# Patient Record
Sex: Female | Born: 1935 | Race: White | Hispanic: No | Smoking: Former smoker
Health system: Southern US, Community
[De-identification: ages and names within clinical notes are randomized; demographics above are authoritative.]

## PROBLEM LIST (undated history)

## (undated) DIAGNOSIS — Z9889 Other specified postprocedural states: Secondary | ICD-10-CM

## (undated) DIAGNOSIS — Z8619 Personal history of other infectious and parasitic diseases: Secondary | ICD-10-CM

## (undated) DIAGNOSIS — I739 Peripheral vascular disease, unspecified: Secondary | ICD-10-CM

## (undated) DIAGNOSIS — Z8673 Personal history of transient ischemic attack (TIA), and cerebral infarction without residual deficits: Secondary | ICD-10-CM

## (undated) DIAGNOSIS — J440 Chronic obstructive pulmonary disease with acute lower respiratory infection: Secondary | ICD-10-CM

## (undated) DIAGNOSIS — I10 Essential (primary) hypertension: Secondary | ICD-10-CM

## (undated) DIAGNOSIS — R7303 Prediabetes: Secondary | ICD-10-CM

## (undated) DIAGNOSIS — Z8679 Personal history of other diseases of the circulatory system: Secondary | ICD-10-CM

## (undated) DIAGNOSIS — E785 Hyperlipidemia, unspecified: Secondary | ICD-10-CM

## (undated) DIAGNOSIS — I2510 Atherosclerotic heart disease of native coronary artery without angina pectoris: Secondary | ICD-10-CM

## (undated) DIAGNOSIS — F17200 Nicotine dependence, unspecified, uncomplicated: Secondary | ICD-10-CM

## (undated) DIAGNOSIS — J209 Acute bronchitis, unspecified: Secondary | ICD-10-CM

## (undated) DIAGNOSIS — I5042 Chronic combined systolic (congestive) and diastolic (congestive) heart failure: Secondary | ICD-10-CM

## (undated) HISTORY — DX: Personal history of other infectious and parasitic diseases: Z86.19

## (undated) HISTORY — DX: Peripheral vascular disease, unspecified: I73.9

## (undated) HISTORY — DX: Hyperlipidemia, unspecified: E78.5

## (undated) HISTORY — DX: Other specified postprocedural states: Z98.890

## (undated) HISTORY — DX: Essential (primary) hypertension: I10

## (undated) HISTORY — DX: Atherosclerotic heart disease of native coronary artery without angina pectoris: I25.10

## (undated) HISTORY — DX: Chronic obstructive pulmonary disease with (acute) lower respiratory infection: J44.0

## (undated) HISTORY — DX: Acute bronchitis, unspecified: J20.9

## (undated) HISTORY — PX: AORTA - SUPERIOR MESENTERIC ARTERY BYPASS GRAFT: SHX888

## (undated) HISTORY — DX: Nicotine dependence, unspecified, uncomplicated: F17.200

## (undated) HISTORY — DX: Chronic combined systolic (congestive) and diastolic (congestive) heart failure: I50.42

## (undated) HISTORY — DX: Personal history of transient ischemic attack (TIA), and cerebral infarction without residual deficits: Z86.73

## (undated) HISTORY — PX: VEIN LIGATION AND STRIPPING: SHX2653

## (undated) HISTORY — DX: Prediabetes: R73.03

## (undated) HISTORY — DX: Personal history of other diseases of the circulatory system: Z86.79

---
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
## 1971-03-07 HISTORY — PX: TOTAL ABDOMINAL HYSTERECTOMY: SHX209

## 1996-03-06 DIAGNOSIS — Z8673 Personal history of transient ischemic attack (TIA), and cerebral infarction without residual deficits: Secondary | ICD-10-CM

## 1996-03-06 HISTORY — DX: Personal history of transient ischemic attack (TIA), and cerebral infarction without residual deficits: Z86.73

## 2004-03-06 DIAGNOSIS — Z8679 Personal history of other diseases of the circulatory system: Secondary | ICD-10-CM

## 2004-03-06 HISTORY — DX: Personal history of other diseases of the circulatory system: Z86.79

## 2004-07-04 HISTORY — PX: US ECHOCARDIOGRAPHY: HXRAD669

## 2004-11-28 ENCOUNTER — Emergency Department (HOSPITAL_COMMUNITY): Admission: EM | Admit: 2004-11-28 | Discharge: 2004-11-28 | Payer: Self-pay | Admitting: Emergency Medicine

## 2005-01-11 HISTORY — PX: THORACIC AORTIC ANEURYSM REPAIR: SHX799

## 2005-07-04 HISTORY — PX: CT SCAN: SHX5351

## 2010-01-08 LAB — TSH: Free T4: 1.24

## 2010-01-08 LAB — CBC AND DIFFERENTIAL
WBC: 6.9
platelet count: 141

## 2010-01-08 LAB — LIPID PANEL
Direct LDL: 75
Triglycerides: 65

## 2010-01-08 LAB — HEMOGLOBIN A1C: A1c: 6.3

## 2010-01-08 LAB — COMPLETE METABOLIC PANEL WITH GFR
ALT: 14 U/L (ref 7–35)
BUN: 23 mg/dL — AB (ref 4–21)
CO2: 26 mmol/L
Creat: 0.95
Total Protein ELP: 6.7

## 2010-08-23 ENCOUNTER — Ambulatory Visit (INDEPENDENT_AMBULATORY_CARE_PROVIDER_SITE_OTHER): Payer: Medicare Other | Admitting: Family Medicine

## 2010-08-23 ENCOUNTER — Encounter: Payer: Self-pay | Admitting: Family Medicine

## 2010-08-23 VITALS — BP 126/72 | HR 76 | Temp 98.3°F | Ht 60.5 in | Wt 129.1 lb

## 2010-08-23 DIAGNOSIS — I509 Heart failure, unspecified: Secondary | ICD-10-CM | POA: Insufficient documentation

## 2010-08-23 DIAGNOSIS — E785 Hyperlipidemia, unspecified: Secondary | ICD-10-CM

## 2010-08-23 DIAGNOSIS — Z87891 Personal history of nicotine dependence: Secondary | ICD-10-CM | POA: Insufficient documentation

## 2010-08-23 DIAGNOSIS — Z13820 Encounter for screening for osteoporosis: Secondary | ICD-10-CM | POA: Insufficient documentation

## 2010-08-23 DIAGNOSIS — Z8679 Personal history of other diseases of the circulatory system: Secondary | ICD-10-CM

## 2010-08-23 DIAGNOSIS — I10 Essential (primary) hypertension: Secondary | ICD-10-CM | POA: Insufficient documentation

## 2010-08-23 DIAGNOSIS — F17200 Nicotine dependence, unspecified, uncomplicated: Secondary | ICD-10-CM

## 2010-08-23 DIAGNOSIS — Z9889 Other specified postprocedural states: Secondary | ICD-10-CM

## 2010-08-23 MED ORDER — HYDROCHLOROTHIAZIDE 12.5 MG PO CAPS: 12.5000 mg | ORAL_CAPSULE | Freq: Every day | ORAL | Status: DC

## 2010-08-23 MED ORDER — ATORVASTATIN CALCIUM 80 MG PO TABS: 80.0000 mg | ORAL_TABLET | Freq: Every day | ORAL | Status: DC

## 2010-08-23 MED ORDER — METOPROLOL TARTRATE 50 MG PO TABS: 50.0000 mg | ORAL_TABLET | Freq: Two times a day (BID) | ORAL | Status: DC

## 2010-08-23 MED ORDER — ENALAPRIL MALEATE 20 MG PO TABS: 20.0000 mg | ORAL_TABLET | Freq: Two times a day (BID) | ORAL | Status: DC

## 2010-08-23 NOTE — Assessment & Plan Note (Signed)
Discussed dexa scan. Pt prefers to wait for records to see what has been done. If not ordered previously, will recommend set up for this. Some kyphosis on exam, nontender. Recommend yearly vision screen.

## 2010-08-23 NOTE — Assessment & Plan Note (Signed)
no CHF sxs currently.  On ACEI, b blocker.

## 2010-08-23 NOTE — Patient Instructions (Addendum)
We will wait for records from Dr. Lequita Halt. Refills sent to your pharmacy. You are likely due for bone density scan, we will see what has been done in past, may order once I receive records from Dr. Lequita Halt. Ensure getting good calcium and vitamin D in your diet for bone health. Return in 3 months for physical or follow up, fasting a few days prior for blood work. Good to meet you today, call us with questions.

## 2010-08-23 NOTE — Assessment & Plan Note (Signed)
Encouraged cessation. Contemplative. Has tried patches in past, poor result.  Not currently interested in pharmacotherapy.

## 2010-08-23 NOTE — Progress Notes (Signed)
Subjective:    Patient ID: Maria Simon, female    DOB: [birth year], 77 y.o.   MRN: [id number]  HPI CC: new patient, establish  Moved up here 1 year ago, previously seeing Dr. Driscilla Moats in [place], [place].  Told to find new doctor.  Previously seeing him every year, q6 mo for bloodwork.  She requested they send [place] records 1 mo ago, we still have not received.  H/o thoracic aortic aneurysm repair with Tag Gore 2006 by Dr. Pattricia Boss at [facility].  Cold sxs 1 mo ago, took mucinex and tylenol.  Now better.  Smoking 1/2 ppd, started smoking at age 54yo, previously smoked 1 ppd.  Has tried patch with no success.  Would rather quit cold [place].  Preventative: Last tetanus shot unsure. Had pneumovax 2006. Hasn't had shingles shot. Hasn't had dexa scan in past, does get at least 3 cups milk daily with meals.  There is no problem list on file for this patient.  Past Medical History  Diagnosis Date  . CHF (congestive heart failure)     last ultrasound prior to 2011  . HLD (hyperlipidemia)   . HTN (hypertension)   . Status post thoracic aortic aneurysm repair 2006    graft in place  . History of chicken pox   . Smoker    Past Surgical History  Procedure Date  . Thoracic aortic aneurysm repair 01-11-05    first open then endovascular graft; GORE TAG ref: [id number]; Lot: [id number]  . Appendectomy 1950  . Total abdominal hysterectomy 1973    ovaries out.  reason: bleeding   History  Substance Use Topics  . Smoking status: Current Everyday Smoker -- 0.5 packs/day for 61 years    Types: Cigarettes  . Smokeless tobacco: Never Used   Comment: 1/2 PPD usually, but sometimes more  . Alcohol Use: No   Family History  Problem Relation Age of Onset  . Cancer Neg Hx   . Diabetes Neg Hx   . Stroke Neg Hx   . Hypertension Neg Hx    No Known Allergies No current outpatient prescriptions on file prior to visit.   Review of Systems  Constitutional: Negative for fever, chills, activity change,  appetite change, fatigue and unexpected weight change.  HENT: Negative for hearing loss and neck pain.   Eyes: Negative for visual disturbance.  Respiratory: Negative for cough, chest tightness, shortness of breath and wheezing.   Cardiovascular: Negative for chest pain, palpitations and leg swelling.  Gastrointestinal: Negative for nausea, vomiting, abdominal pain, diarrhea, constipation, blood in stool and abdominal distention.  Genitourinary: Negative for hematuria and difficulty urinating.  Musculoskeletal: Negative for myalgias and arthralgias.  Skin: Negative for rash.  Neurological: Negative for dizziness, seizures, syncope and headaches.  Hematological: Negative for adenopathy. Does not bruise/bleed easily.  Psychiatric/Behavioral: Negative for dysphoric mood. The patient is not nervous/anxious.        Objective:   Physical Exam  Nursing note and vitals reviewed. Constitutional: She is oriented to person, place, and time. She appears well-developed and well-nourished. No distress.  HENT:  Head: Normocephalic and atraumatic.  Right Ear: External ear normal.  Left Ear: External ear normal.  Nose: Nose normal.  Mouth/Throat: Oropharynx is clear and moist.  Eyes: Conjunctivae and EOM are normal. Pupils are equal, round, and reactive to light. No scleral icterus.       Beginnings of cataracts bilaterally  Neck: Normal range of motion. Neck supple. No thyromegaly present.  Cardiovascular: Normal rate, regular rhythm,  normal heart sounds and intact distal pulses.   No murmur heard. Pulses:      Radial pulses are 2+ on the right side, and 2+ on the left side.  Pulmonary/Chest: Effort normal and breath sounds normal. No respiratory distress. She has no wheezes. She has no rales.  Abdominal: Soft. Bowel sounds are normal. She exhibits no distension and no mass. There is no tenderness. There is no rebound and no guarding.  Musculoskeletal: Normal range of motion.       Mild senile  kyphosis  Lymphadenopathy:    She has no cervical adenopathy.  Neurological: She is alert and oriented to person, place, and time.       CN grossly intact, station and gait intact  Skin: Skin is warm and dry. No rash noted.  Psychiatric: She has a normal mood and affect. Her behavior is normal. Judgment and thought content normal.          Assessment & Plan:

## 2010-08-23 NOTE — Assessment & Plan Note (Signed)
Good control on current regimen. Continue meds.

## 2010-08-23 NOTE — Assessment & Plan Note (Signed)
longterm atorvastatin 80mg  nightly. Await records from pcp.

## 2010-08-23 NOTE — Assessment & Plan Note (Signed)
On baby aspirin. Advised if any chest pain, new thoracic back pain to return or seek care.

## 2010-08-29 ENCOUNTER — Telehealth: Payer: Self-pay | Admitting: *Deleted

## 2010-08-29 NOTE — Telephone Encounter (Signed)
Attempted to call pt back, number listed in chart is incorrect.

## 2010-08-29 NOTE — Telephone Encounter (Signed)
Please notify I did receive records from previous PCP.

## 2010-08-29 NOTE — Telephone Encounter (Signed)
Pt is asking if you have received her records from Dr. Lequita Halt in [place] yet.

## 2010-09-28 ENCOUNTER — Encounter: Payer: Self-pay | Admitting: Family Medicine

## 2010-09-29 ENCOUNTER — Encounter: Payer: Self-pay | Admitting: Family Medicine

## 2010-11-05 HISTORY — PX: OTHER SURGICAL HISTORY: SHX169

## 2010-11-16 ENCOUNTER — Other Ambulatory Visit: Payer: Self-pay | Admitting: Family Medicine

## 2010-11-16 DIAGNOSIS — I10 Essential (primary) hypertension: Secondary | ICD-10-CM

## 2010-11-16 DIAGNOSIS — E785 Hyperlipidemia, unspecified: Secondary | ICD-10-CM

## 2010-11-18 ENCOUNTER — Other Ambulatory Visit (INDEPENDENT_AMBULATORY_CARE_PROVIDER_SITE_OTHER): Payer: Medicare Other

## 2010-11-18 DIAGNOSIS — E785 Hyperlipidemia, unspecified: Secondary | ICD-10-CM

## 2010-11-18 DIAGNOSIS — I10 Essential (primary) hypertension: Secondary | ICD-10-CM

## 2010-11-18 LAB — COMPREHENSIVE METABOLIC PANEL
Albumin: 3.9 g/dL (ref 3.5–5.2)
Alkaline Phosphatase: 99 U/L (ref 39–117)
CO2: 28 mEq/L (ref 19–32)
GFR: 60.93 mL/min (ref >60.00)
Glucose, Bld: 113 mg/dL — ABNORMAL HIGH (ref 70–99)
Sodium: 140 mEq/L (ref 135–145)
Total Protein: 7 g/dL (ref 6.0–8.3)

## 2010-11-18 LAB — LIPID PANEL
Cholesterol: 109 mg/dL (ref 0–200)
LDL Cholesterol: 59 mg/dL (ref 0–99)
Total CHOL/HDL Ratio: 3
Triglycerides: 60 mg/dL (ref 0.0–149.0)
VLDL: 12 mg/dL (ref 0.0–40.0)

## 2010-11-25 ENCOUNTER — Ambulatory Visit (INDEPENDENT_AMBULATORY_CARE_PROVIDER_SITE_OTHER): Payer: Medicare Other | Admitting: Family Medicine

## 2010-11-25 ENCOUNTER — Encounter: Payer: Self-pay | Admitting: Family Medicine

## 2010-11-25 VITALS — BP 128/70 | HR 80 | Temp 98.0°F | Wt 128.8 lb

## 2010-11-25 DIAGNOSIS — M8580 Other specified disorders of bone density and structure, unspecified site: Secondary | ICD-10-CM

## 2010-11-25 DIAGNOSIS — Z23 Encounter for immunization: Secondary | ICD-10-CM

## 2010-11-25 DIAGNOSIS — F17200 Nicotine dependence, unspecified, uncomplicated: Secondary | ICD-10-CM

## 2010-11-25 DIAGNOSIS — M810 Age-related osteoporosis without current pathological fracture: Secondary | ICD-10-CM | POA: Insufficient documentation

## 2010-11-25 DIAGNOSIS — I2510 Atherosclerotic heart disease of native coronary artery without angina pectoris: Secondary | ICD-10-CM | POA: Insufficient documentation

## 2010-11-25 DIAGNOSIS — I739 Peripheral vascular disease, unspecified: Secondary | ICD-10-CM

## 2010-11-25 DIAGNOSIS — I10 Essential (primary) hypertension: Secondary | ICD-10-CM

## 2010-11-25 DIAGNOSIS — E785 Hyperlipidemia, unspecified: Secondary | ICD-10-CM

## 2010-11-25 DIAGNOSIS — Z8673 Personal history of transient ischemic attack (TIA), and cerebral infarction without residual deficits: Secondary | ICD-10-CM

## 2010-11-25 MED ORDER — MULTIVITAMINS PO TABS: 1.0000 | ORAL_TABLET | Freq: Every day | ORAL | Status: AC

## 2010-11-25 MED ORDER — VITAMIN D 1000 UNITS PO CAPS: 1000.0000 [IU] | ORAL_CAPSULE | Freq: Every day | ORAL | Status: DC

## 2010-11-25 NOTE — Assessment & Plan Note (Signed)
BP Readings from Last 3 Encounters:  11/25/10 128/70  08/23/10 126/72  good control continued, continue current meds. Stable, chronic.

## 2010-11-25 NOTE — Progress Notes (Signed)
  Subjective:    Patient ID: Maria Simon, female    DOB: 02-12-1934, 77 y.o.   MRN: [id number]  HPI CC: 29mo f/u  Maria Simon presented 3 mo ago to establish care with me.  In interim doing well.  States last 2-3 wks not feeling great.  No pain.  Off multivitamins for last year, wonders if should restart.  Denies ST, hoarseness, fevers, night sweats, weight changes.  Just feeling drained at times.  1. HTN - No HA, vision changes, CP/tightness, SOB, leg swelling.  Compliant with and tolerant of meds.  2. HLD - tolerating lipitor 80mg  daily.  No myalgias.  H/o PVD - s/p aortic aneurysm repair  3. ?dexa - none noted on records received from pcp.  Would like dexa scan set up.  H/o osteopenia on CT 2008.  H/o T11, L2 compression fractures on CT, pt denies sxs.  4. Smoking - 1/2 ppd.  Contemplative  H/o declining preventative care per last PCP.  However with discussion today, states can only have workup on fridays as daughter is her transportation and works other days.    Wt Readings from Last 3 Encounters:  11/25/10 128 lb 12 oz (58.401 kg)  08/23/10 129 lb 1.9 oz (58.568 kg)   Review of Systems Per HPI    Objective:   Physical Exam  Nursing note and vitals reviewed. Constitutional: She appears well-developed and well-nourished. No distress.       Mild senile kyphosis  HENT:  Head: Normocephalic and atraumatic.  Mouth/Throat: Oropharynx is clear and moist. No oropharyngeal exudate.  Eyes: Conjunctivae and EOM are normal. Pupils are equal, round, and reactive to light. No scleral icterus.  Neck: Normal range of motion. Neck supple. Carotid bruit is not present.  Cardiovascular: Normal rate, regular rhythm, normal heart sounds and intact distal pulses.   No murmur heard. Pulmonary/Chest: Effort normal and breath sounds normal. No respiratory distress. She has no wheezes. She has no rales.  Abdominal: Soft. There is no tenderness.       No abd/renal bruit  Lymphadenopathy:    She has  no cervical adenopathy.  Skin: Skin is warm and dry. No rash noted.          Assessment & Plan:

## 2010-11-25 NOTE — Assessment & Plan Note (Signed)
Stable, chronic. Continue longterm atorvastatin 80mg  nightly.

## 2010-11-25 NOTE — Assessment & Plan Note (Signed)
Discussed osteopenia dx with pt along with goal daily Ca and Vit D intake.   Pt requests DEXA scan today.  Reviewed labwork - ?prediabetes with cbg 113.  Will need A1c next check as well as will need Vit D level next blood draw.

## 2010-11-25 NOTE — Assessment & Plan Note (Signed)
Again encouraged cessation. Contemplative.

## 2010-11-25 NOTE — Patient Instructions (Addendum)
Pass by Maria Simon's office to schedule dexa scan. Goal calcium intake is 1200mg  daily, goal vitamin D intake is 800 IU daily. 1 glass milk is 300mg  of calcium. Consider taking some vitamin D daily (1000iu). Return to see me in 6 months, if dexa scan abnormal, may have you come in sooner to discuss.

## 2010-12-02 ENCOUNTER — Encounter: Payer: Self-pay | Admitting: Family Medicine

## 2010-12-02 ENCOUNTER — Telehealth: Payer: Self-pay | Admitting: Family Medicine

## 2010-12-02 ENCOUNTER — Ambulatory Visit
Admission: RE | Admit: 2010-12-02 | Discharge: 2010-12-02 | Disposition: A | Discharge status: Home / Self Care | Payer: Medicare Other | Source: Ambulatory Visit | Attending: Family Medicine | Admitting: Family Medicine

## 2010-12-02 DIAGNOSIS — M8580 Other specified disorders of bone density and structure, unspecified site: Secondary | ICD-10-CM

## 2010-12-02 NOTE — Telephone Encounter (Signed)
Please notify dexa scan showing osteoporosis.  Would like her to come in to discuss treatment options.

## 2010-12-05 NOTE — Telephone Encounter (Signed)
Patient notified and appt scheduled.

## 2010-12-09 ENCOUNTER — Ambulatory Visit: Payer: Medicare Other | Admitting: Family Medicine

## 2010-12-16 ENCOUNTER — Ambulatory Visit (INDEPENDENT_AMBULATORY_CARE_PROVIDER_SITE_OTHER): Payer: Medicare Other | Admitting: Family Medicine

## 2010-12-16 ENCOUNTER — Encounter: Payer: Self-pay | Admitting: Family Medicine

## 2010-12-16 DIAGNOSIS — F17200 Nicotine dependence, unspecified, uncomplicated: Secondary | ICD-10-CM

## 2010-12-16 DIAGNOSIS — M810 Age-related osteoporosis without current pathological fracture: Secondary | ICD-10-CM

## 2010-12-16 MED ORDER — ALENDRONATE SODIUM 70 MG PO TABS: 70.0000 mg | ORAL_TABLET | ORAL | Status: DC

## 2010-12-16 NOTE — Assessment & Plan Note (Signed)
Encouraged cessation and discussed different cessation methods. Discussed sandwich bag method, pt will try this.

## 2010-12-16 NOTE — Patient Instructions (Addendum)
You do have osteoporosis. Increase dietary vitamin D intake.  That means more fish - mackerel, tuna, salmon, herring, sardines, catfish, cod liver oil, and eggs.  And small doses of sunshine throughout the day.  Calcium supplements have received some bad press lately, with questions that they may increase risk of heart attack or blood clots.  The risk is very low, however none of these risks occur with calcium in FOOD. Try to get most or all of your calcium from your food--aim for 1000 mg/day for women up to 50 and men up to 70 and 1200 mg/day for women over 50 and men over 70.  To figure out dietary calcium: 300 mg/day from all non dairy foods plus 300 mg per cup of milk, other dairy, or fortified juice.  Non dairy foods that contain calcium: Kale, oranges, sardines, oatmeal, soy milk/soybeans, salmon, white beans, dried figs, turnip greens, almonds, broccoli, tofu.  Osteoporosis Osteoporosis is a disease of the bones that makes them weaker and prone to break (fracture). By their mid-30s, most people begin to gradually lose bone strength. If this is severe enough, osteoporosis may occur. Osteopenia is a less severe weakness of the bones, which places you at risk for osteoporosis. It is important to identify if you have osteoporosis or osteopenia. Bone fractures from osteoporosis (especially hip and spine fractures) are a major cause of hospitalization, loss of independence, and can lead to life-threatening complications. CAUSES There are a number of causes and risk factors:  Gender. Women are at a higher risk for osteoporosis than men.   Age. Bone formation slows down with age.   Ethnicity. For unclear reasons, white and Asian women are at higher risk for osteoporosis. Hispanic and African American women are at increased, but lesser, risk.   Family history of osteoporosis can mean that you are at a higher risk for getting it.   History of bone fractures indicates you may be at higher risk  of another.   Calcium is very important for bone health and strength. Not enough calcium in your diet increases your risk for osteoporosis. Vitamin D is important for calcium metabolism. You get vitamin D from sunlight, foods, or supplements.   Physical activity. Bones get stronger with weight-bearing exercise and weaker without use.   Smoking is associated with decreased bone strength.   Medicines. Cortisone medicines, too much thyroid medicine, some cancer and seizure medicines, and others can weaken bones and cause osteoporosis.   Decreased body weight is associated with osteoporosis. The small amount of estrogen-type molecules produced in fat cells seems to protect the bones.   Menopausal decrease in the hormone estrogen can cause osteoporosis.   Low levels of the hormone testosterone can cause osteoporosis.   Some medical conditions can lead to osteoporosis (hyperthyroidism, hyperparathyroidism, B12 deficiency).  SYMPTOMS Usually, no symptoms are felt as the bones weaken. The first symptoms are generally related to bone fractures. You may have silent, tiny bone fractures, especially in your spine. This can cause height loss and forward bending of the spine (kyphosis). DIAGNOSIS You or your caregiver may suspect osteoporosis based on height loss and kyphosis. Osteoporosis or osteopenia may be identified on an X-ray done for other reasons. A bone density measurement will likely be taken. Your bones are often measured at your lower spine or your hips. Measurement is done by an X-ray called a DEXA scan, or sometimes by a computerized X-ray scan (CT or CAT scan). Other tests may be done to find the cause of  osteoporosis, such as blood tests to measure calcium and vitamin D, or to monitor treatment. TREATMENT The goal of osteoporosis treatment is to prevent fractures. This is done through medicine and home care treatments. Treatment will slow the weakening of your bones and strengthen them where  possible. Measures to decrease the likelihood of falling and fracturing a bone are also important. Medicine  You may need supplements if you are not getting enough calcium, vitamin D, and vitamin B12.   If you are female and menopausal, you should discuss the option of estrogen replacement or estrogen-like medicine with your caregiver.   Medicines can be taken by mouth or injection to help build bone strength. When taken by mouth, there are important directions that you need to follow.   Calcitonin is a hormone made by the thyroid gland that can help build bone strength and decrease fracture risk in the spine. It can be taken by nasal spray or injection.   Parathyroid hormone can be injected to help build bone strength.   You will need to continue to get enough calcium intake with any of these medicines.  FALL PREVENTION  If you are unsteady on your feet, use a cane, walker, or walk with someone's help.   Remove loose rugs or electrical cords from your home.   Keep your home well lit at night. Use glasses if you need them.   Avoid icy streets and wet or waxed floors.   Hold the railing when using stairs.   Watch out for your pets.   Install grab bars in your bathroom.   Exercise. Physical activity, especially weight-bearing exercise, helps strengthen bones. Strength and balance exercise, such as tai chi, helps prevent falls.   Alcohol and some medicines can make you more likely to fall. Discuss alcohol use with your caregiver. Ask your caregiver if any of your medicines might increase your risk for falling. Ask if safer alternatives are available.  HOME CARE INSTRUCTIONS  Try to prevent and avoid falls.   To pick up objects, bend at the knees. Do not bend with your back.   Do not smoke. If you smoke, ask for help to stop.   Have adequate calcium and vitamin D in your diet. Talk with your caregiver about amounts.   Before exercising, ask your caregiver what exercises will be  good for you.   Only take over-the-counter or prescription medicines for pain, discomfort, or fever as directed by your caregiver.  SEEK MEDICAL CARE IF:  You have had a fracture and your pain is not controlled.   You have had a fracture and you are not able to return to activities as expected.   You are reinjured.   You develop side effects from medicines, especially stomach pain or trouble swallowing.   You develop new, unexplained problems.  SEEK IMMEDIATE MEDICAL CARE IF:  You develop sudden, severe pain in your back.   You develop pain after an injury or fall.  Document Released: 11/30/2004 Document Re-Released: 08/10/2009 [facility] Patient Information 2011 [place], [place].

## 2010-12-16 NOTE — Assessment & Plan Note (Signed)
Discussed in detail diagnosis as well as treatment including vitD/calcium (recommended from diet as much as able) and bisphosphonates. Discussed side effects, use of med, and adverse effects (AVN jaw, atypical hip fracture, GI issues). Then brought daughter into room to further discuss per pt preference. Pt decides to try bisphosphoante. A total of 25 minutes were spent face-to-face with the patient during this encounter and over half of that time was spent on counseling and coordination of care

## 2010-12-16 NOTE — Progress Notes (Signed)
  Subjective:    Patient ID: Maria Simon, female    DOB: Nov 24, 1933, 77 y.o.   MRN: [id number]  HPI CC: discuss osteoporosis  New dx osteoporosis on DEXA 11/2010 (previously thought osteopenia).  Last visit discussed calcium and vitamin D - pt has started 1/day.  H/o T11 and L5 compression fractures by CT scan.  Pt denies h/o this.  Smoking 1/2 ppd.  Has smoked since age 77yo.  Smokes more in morning.  Has used patch, didn't like how it made her feel.  Has used electric cigarette.  Has heard bad things about wellbutrin.  Not currently interested in quitting.    Lab Results  Component Value Date   CREATININE 1.0 11/18/2010   Review of Systems Per HPI    Objective:   Physical Exam  Nursing note and vitals reviewed. Constitutional: She appears well-developed and well-nourished. No distress.  Psychiatric: She has a normal mood and affect.      Assessment & Plan:

## 2010-12-17 LAB — VITAMIN D 25 HYDROXY (VIT D DEFICIENCY, FRACTURES): Vit D, 25-Hydroxy: 117 ng/mL — ABNORMAL HIGH (ref 30–89)

## 2010-12-18 ENCOUNTER — Telehealth: Payer: Self-pay | Admitting: Family Medicine

## 2010-12-18 NOTE — Telephone Encounter (Signed)
Please notify vitamin D actually returned a bit high, I'd like [place] to hold on all vitamin D supplements for now.  (ok to continue MVI and ok to take fosamax). Will recheck when returns off Vitamin D supplements in March.

## 2010-12-19 NOTE — Telephone Encounter (Signed)
Patient notified and will hold off on Vitamin D. She will continue MVT and start fosamax.

## 2011-04-07 ENCOUNTER — Other Ambulatory Visit: Payer: Self-pay | Admitting: Family Medicine

## 2011-05-26 ENCOUNTER — Encounter: Payer: Self-pay | Admitting: Family Medicine

## 2011-05-26 ENCOUNTER — Ambulatory Visit (INDEPENDENT_AMBULATORY_CARE_PROVIDER_SITE_OTHER): Payer: Medicare Other | Admitting: Family Medicine

## 2011-05-26 VITALS — BP 130/78 | HR 80 | Temp 98.0°F | Wt 133.0 lb

## 2011-05-26 DIAGNOSIS — I509 Heart failure, unspecified: Secondary | ICD-10-CM

## 2011-05-26 DIAGNOSIS — F17200 Nicotine dependence, unspecified, uncomplicated: Secondary | ICD-10-CM

## 2011-05-26 DIAGNOSIS — R7309 Other abnormal glucose: Secondary | ICD-10-CM

## 2011-05-26 DIAGNOSIS — R7303 Prediabetes: Secondary | ICD-10-CM | POA: Insufficient documentation

## 2011-05-26 DIAGNOSIS — I10 Essential (primary) hypertension: Secondary | ICD-10-CM | POA: Diagnosis not present

## 2011-05-26 DIAGNOSIS — M810 Age-related osteoporosis without current pathological fracture: Secondary | ICD-10-CM

## 2011-05-26 DIAGNOSIS — E785 Hyperlipidemia, unspecified: Secondary | ICD-10-CM

## 2011-05-26 HISTORY — DX: Prediabetes: R73.03

## 2011-05-26 NOTE — Assessment & Plan Note (Signed)
Encouraged cessation pre contemplative  

## 2011-05-26 NOTE — Assessment & Plan Note (Signed)
Denies SOB, CP, leg swelling.  On ACEI, B blocker.

## 2011-05-26 NOTE — Assessment & Plan Note (Signed)
BP Readings from Last 3 Encounters:  05/26/11 130/78  12/16/10 116/72  11/25/10 128/70   stable on current regimen. Continue. Chronic.

## 2011-05-26 NOTE — Assessment & Plan Note (Signed)
Check when returns fasting. Tolerating atorvastatin 80mg  nighlty.

## 2011-05-26 NOTE — Patient Instructions (Signed)
Keep thinking about cutting back on smoking! Advanced directive information provided today. Keep eye on groin pain - if becoming more frequent please let me know Return in 6months for medicare wellness visit, a few days prior for fasting blood work. Good to see you today, call us with questions.

## 2011-05-26 NOTE — Progress Notes (Signed)
  Subjective:    Patient ID: Maria Simon, female    DOB: [birth year], 78 y.o.   MRN: [id number]  HPI CC: 6 mo f/u  Pt is a minimalist, has declined preventative and other interventions in past.  OP - Wonders if still needs bisphosphonate.  No jaw pain.  Occasional groin pain on left.  This comes and goes for last month.  Comes on once a week.  Unsure if had this prior to bisphosphonate but thinks this has happened before.  Compliant with directions of taking fosamax.  HTN - No HA, vision changes, CP/tightness, SOB, leg swelling.  Compliant with meds.  H/o combined sys/dias CHF, no recent echo.  Thoracic aortic aneurysm s/p repair 2006 - has declined CT f/u in past.  Smoking - 1/2 ppd.  Not interested in quitting at this time.  Grandson passed away 2 wks ago.  Unsure how it happened - under criminal investigation.    Denies falls in last year. Denies anhedonia or depression (besides above situation).  Would like to discuss advanced directives "how do I get a DNR?"  Would not want prolonged life support.  Unsure if would even like intubation/CPR.  Will take information to read up on advanced directives.  No HCPOA set up yet.  Past Medical History  Diagnosis Date  . CHF (congestive heart failure)     last ultrasound prior to 2011, EF 10-15%, dilated likely nonischemic CM with CHF, will need to estabilsh with cards  . HLD (hyperlipidemia)   . HTN (hypertension)   . Status post thoracic aortic aneurysm repair 2006    graft in place, refused CT f/u in past.  . History of chicken pox   . Smoker     ? COPD  . History of TIAs 1998  . CAD (coronary artery disease)     class 1-2 angina  . PAD (peripheral artery disease)   . Osteoporosis     h/o compression fx T11, L5 by CT scan, Dexa 11/2010 -3.9 spine, -3.5 femur    Review of Systems Per HPI    Objective:   Physical Exam  Nursing note and vitals reviewed. Constitutional: She appears well-developed and well-nourished. No  distress.  HENT:  Head: Normocephalic and atraumatic.  Mouth/Throat: Oropharynx is clear and moist. No oropharyngeal exudate.  Eyes: Conjunctivae and EOM are normal. Pupils are equal, round, and reactive to light. No scleral icterus.  Neck: Normal range of motion. Neck supple.  Cardiovascular: Normal rate, regular rhythm, normal heart sounds and intact distal pulses.   No murmur heard. Pulmonary/Chest: Effort normal and breath sounds normal. No respiratory distress. She has no wheezes. She has no rales.       coarse  Musculoskeletal: She exhibits no edema.       Neg FABER No pain with int/ext rotation at hip.  No groin pain to palpation  Lymphadenopathy:    She has no cervical adenopathy.  Skin: Skin is warm and dry. No rash noted.  Psychiatric: She has a normal mood and affect.       Assessment & Plan:

## 2011-05-26 NOTE — Assessment & Plan Note (Signed)
On fosamax, reviewed way to take med.  Compliant. Doubt AVN - exam normal today.  Continue to monitor. Significant osteoporosis - recommend continue for at least 2 yrs. If worsening hip pain to stop bisphosphonate and let me know.

## 2011-08-22 ENCOUNTER — Telehealth: Payer: Self-pay

## 2011-08-22 MED ORDER — GUAIFENESIN-CODEINE 100-10 MG/5ML PO SYRP: 5.0000 mL | ORAL_SOLUTION | Freq: Two times a day (BID) | ORAL | Status: AC | PRN

## 2011-08-22 NOTE — Telephone Encounter (Signed)
Any worsening leg swelilng? May try cough syrup at night time if desired (cheratussin), but if not improving do recommend she come in for OV given comorbidities.  Placed order in chart

## 2011-08-22 NOTE — Telephone Encounter (Signed)
For 1 week productive cough with clear phlegm,no wheezing,no difficulty breathing,no fever or sore throat. No head congestion. Cough keeps pt awake at night. Taking Mucinex OTC. Pt does not want to make appt. [facility] [employer] . Please advise.

## 2011-08-22 NOTE — Telephone Encounter (Signed)
Patient denies any leg swelling. Just cold/cough symptoms. She did request cough syrup. Called in as directed. She said she had been feeling bad x 1 week. Advised if she didn't start feeling better in the next couple days, she would need an OV. She verbalized understanding and said she would call back if needed.

## 2011-08-30 ENCOUNTER — Other Ambulatory Visit: Payer: Self-pay | Admitting: Family Medicine

## 2011-09-14 ENCOUNTER — Other Ambulatory Visit: Payer: Self-pay | Admitting: Family Medicine

## 2011-11-14 ENCOUNTER — Other Ambulatory Visit: Payer: Self-pay | Admitting: Family Medicine

## 2011-11-24 ENCOUNTER — Other Ambulatory Visit (INDEPENDENT_AMBULATORY_CARE_PROVIDER_SITE_OTHER): Payer: Medicare Other

## 2011-11-24 DIAGNOSIS — R7303 Prediabetes: Secondary | ICD-10-CM

## 2011-11-24 DIAGNOSIS — E785 Hyperlipidemia, unspecified: Secondary | ICD-10-CM | POA: Diagnosis not present

## 2011-11-24 DIAGNOSIS — R7309 Other abnormal glucose: Secondary | ICD-10-CM | POA: Diagnosis not present

## 2011-11-24 DIAGNOSIS — F17200 Nicotine dependence, unspecified, uncomplicated: Secondary | ICD-10-CM

## 2011-11-24 LAB — COMPREHENSIVE METABOLIC PANEL
CO2: 28 mEq/L (ref 19–32)
Chloride: 105 mEq/L (ref 96–112)
Creatinine, Ser: 1 mg/dL (ref 0.4–1.2)
Potassium: 4.8 mEq/L (ref 3.5–5.1)
Total Bilirubin: 0.7 mg/dL (ref 0.3–1.2)
Total Protein: 6.9 g/dL (ref 6.0–8.3)

## 2011-11-24 LAB — CBC WITH DIFFERENTIAL/PLATELET
Basophils Absolute: 0 10*3/uL (ref 0.0–0.1)
Basophils Relative: 0.5 % (ref 0.0–3.0)
Eosinophils Absolute: 0.1 10*3/uL (ref 0.0–0.7)
Eosinophils Relative: 1.4 % (ref 0.0–5.0)
HCT: 42.5 % (ref 36.0–46.0)
Hemoglobin: 13.9 g/dL (ref 12.0–15.0)
Lymphs Abs: 2.3 10*3/uL (ref 0.7–4.0)
Neutro Abs: 4.2 10*3/uL (ref 1.4–7.7)
RDW: 16 % — ABNORMAL HIGH (ref 11.5–14.6)

## 2011-11-24 LAB — TSH: TSH: 2.42 u[IU]/mL (ref 0.35–5.50)

## 2011-11-24 LAB — LIPID PANEL
Cholesterol: 106 mg/dL (ref 0–200)
HDL: 32.9 mg/dL — ABNORMAL LOW (ref >39.00)
Total CHOL/HDL Ratio: 3

## 2011-11-24 LAB — HEMOGLOBIN A1C: Hgb A1c MFr Bld: 6.2 % (ref 4.6–6.5)

## 2011-11-24 NOTE — Addendum Note (Signed)
Addended by: Alvina Chou on: 11/24/2011 08:07 AM   Modules accepted: Orders

## 2011-12-01 ENCOUNTER — Encounter: Payer: Self-pay | Admitting: Family Medicine

## 2011-12-01 ENCOUNTER — Ambulatory Visit (INDEPENDENT_AMBULATORY_CARE_PROVIDER_SITE_OTHER): Payer: Medicare Other | Admitting: Family Medicine

## 2011-12-01 VITALS — BP 124/76 | HR 67 | Temp 97.7°F | Wt 128.8 lb

## 2011-12-01 DIAGNOSIS — E785 Hyperlipidemia, unspecified: Secondary | ICD-10-CM

## 2011-12-01 DIAGNOSIS — I509 Heart failure, unspecified: Secondary | ICD-10-CM

## 2011-12-01 DIAGNOSIS — R7309 Other abnormal glucose: Secondary | ICD-10-CM

## 2011-12-01 DIAGNOSIS — M810 Age-related osteoporosis without current pathological fracture: Secondary | ICD-10-CM | POA: Diagnosis not present

## 2011-12-01 DIAGNOSIS — Z23 Encounter for immunization: Secondary | ICD-10-CM

## 2011-12-01 DIAGNOSIS — I10 Essential (primary) hypertension: Secondary | ICD-10-CM

## 2011-12-01 DIAGNOSIS — F17200 Nicotine dependence, unspecified, uncomplicated: Secondary | ICD-10-CM

## 2011-12-01 DIAGNOSIS — R7303 Prediabetes: Secondary | ICD-10-CM

## 2011-12-01 NOTE — Assessment & Plan Note (Signed)
Chronic, stable. Continue meds. 

## 2011-12-01 NOTE — Assessment & Plan Note (Signed)
Concern for groin pain after starting fosamax.  Will stop.  Reassess next visit, consider prolia. Not on vitamin D 2/2 high level last blood check - due for repeat. Not on calcium 2/2 h/o CAD

## 2011-12-01 NOTE — Assessment & Plan Note (Signed)
Reviewed #s.  Increase aerobic exercise.

## 2011-12-01 NOTE — Patient Instructions (Addendum)
Flu shot today. Stop fosamax for now.  We will discuss other treatment options at next visit.  Continue to hold vitamin D. Return in 6 months for medicare wellness visit. Make sure you have 3 balanced meals every day - sugar was a bit elevated - in prediabetes range so we will have to keep an eye on this.

## 2011-12-01 NOTE — Assessment & Plan Note (Signed)
Discussed importance of healthy balanced diet and regular meals.

## 2011-12-01 NOTE — Assessment & Plan Note (Signed)
Continue to encourage cessation. 

## 2011-12-01 NOTE — Progress Notes (Signed)
  Subjective:    Patient ID: Maria Simon, female    DOB: Dec 19, 1933, 78 y.o.   MRN: [id number]  HPI CC: 6 mo f/u  OP - severe.  On fosamax for last 6 months.  Continues to have L groin pain, sometimes has pain behind L knee.  Occasionally with right lip numbness.  Has held vitamin D since 12/2010 because her level was actually elevated to 117.  HTN - No HA, vision changes, CP/tightness, SOB, leg swelling.  Compliant with all meds.  H/o combined s/d CHF - no SOB, leg swelling, orthopnea.  HLD - compliant with lipitor 80mg  qhs.  Not having any muscle pains.  Has had 2-3 spells of weakness and shaking.  First time happened after a lot of walking.  Second time happened one morning after shower.  Felt a bit lightheaded.  Decreased energy.  Denies vertigo, palpitations, chest pain or tightness, SOB, HA.  No falls.  No leg weakness.  Felt very drained.  Smoking - 1/2 ppd.  precontemplative.  Appetite ok. Wt Readings from Last 3 Encounters:  12/01/11 128 lb 12 oz (58.401 kg)  05/26/11 133 lb (60.328 kg)  12/16/10 131 lb 12 oz (59.761 kg)    Past Medical History  Diagnosis Date  . Systolic and diastolic CHF, chronic     last ultrasound prior to 2011, EF 10-15%, dilated likely nonischemic CM with CHF, will need to estabilsh with cards  . HLD (hyperlipidemia)   . HTN (hypertension)   . Status post thoracic aortic aneurysm repair 2006    graft in place, refused CT f/u in past.  . History of chicken pox   . Smoker     ? COPD  . History of TIAs 1998  . CAD (coronary artery disease)     class 1-2 angina  . PAD (peripheral artery disease)   . Osteoporosis     h/o compression fx T11, L5 by CT scan, Dexa 11/2010 -3.9 spine, -3.5 femur    Review of Systems per HPI    Objective:   Physical Exam  Nursing note and vitals reviewed. Constitutional: She appears well-developed and well-nourished. No distress.  HENT:  Head: Normocephalic and atraumatic.  Mouth/Throat: Oropharynx is clear  and moist. No oropharyngeal exudate.  Eyes: Conjunctivae normal and EOM are normal. Pupils are equal, round, and reactive to light.  Neck: Normal range of motion. Neck supple. Carotid bruit is not present.  Cardiovascular: Normal rate, regular rhythm, normal heart sounds and intact distal pulses.   No murmur heard. Pulmonary/Chest: Effort normal and breath sounds normal. No respiratory distress. She has no wheezes. She has no rales.  Musculoskeletal: She exhibits no edema.       No pain with int/ext rotation of hip at hip. FROM at hip to flexion/extension without pain  Skin: Skin is warm and dry. No rash noted.  Psychiatric: She has a normal mood and affect.       Assessment & Plan:

## 2011-12-01 NOTE — Assessment & Plan Note (Signed)
Chronic, stable. Continue med regimen. asxs.

## 2012-02-07 ENCOUNTER — Other Ambulatory Visit: Payer: Self-pay | Admitting: Family Medicine

## 2012-03-14 ENCOUNTER — Other Ambulatory Visit: Payer: Self-pay | Admitting: *Deleted

## 2012-03-14 MED ORDER — ATORVASTATIN CALCIUM 80 MG PO TABS: 80.0000 mg | ORAL_TABLET | Freq: Every day | ORAL | Status: DC

## 2012-04-06 ENCOUNTER — Other Ambulatory Visit: Payer: Self-pay | Admitting: Family Medicine

## 2012-05-15 ENCOUNTER — Telehealth: Payer: Self-pay | Admitting: Family Medicine

## 2012-05-15 NOTE — Telephone Encounter (Signed)
Scheduled a 6 month follow up appointment for 05/31/12.  She does not know if she needs labs the week before or not.  Please call patient and let her know if we need to schedule a lab appt. For her.

## 2012-05-15 NOTE — Telephone Encounter (Signed)
Spoke with patient. Appt changed to medicare wellness. Lab appt scheduled.

## 2012-05-24 ENCOUNTER — Other Ambulatory Visit (INDEPENDENT_AMBULATORY_CARE_PROVIDER_SITE_OTHER): Payer: Medicare Other

## 2012-05-24 ENCOUNTER — Other Ambulatory Visit: Payer: Self-pay | Admitting: Family Medicine

## 2012-05-24 DIAGNOSIS — R7303 Prediabetes: Secondary | ICD-10-CM

## 2012-05-24 DIAGNOSIS — I10 Essential (primary) hypertension: Secondary | ICD-10-CM | POA: Diagnosis not present

## 2012-05-24 DIAGNOSIS — D696 Thrombocytopenia, unspecified: Secondary | ICD-10-CM

## 2012-05-24 DIAGNOSIS — R7309 Other abnormal glucose: Secondary | ICD-10-CM | POA: Diagnosis not present

## 2012-05-24 LAB — CBC WITH DIFFERENTIAL/PLATELET
Basophils Absolute: 0 10*3/uL (ref 0.0–0.1)
HCT: 41.1 % (ref 36.0–46.0)
Lymphs Abs: 1.7 10*3/uL (ref 0.7–4.0)
MCV: 84.3 fl (ref 78.0–100.0)
Monocytes Absolute: 0.5 10*3/uL (ref 0.1–1.0)
Platelets: 147 10*3/uL — ABNORMAL LOW (ref 150.0–400.0)
RDW: 14.8 % — ABNORMAL HIGH (ref 11.5–14.6)

## 2012-05-24 LAB — HEMOGLOBIN A1C: Hgb A1c MFr Bld: 6.4 % (ref 4.6–6.5)

## 2012-05-24 LAB — BASIC METABOLIC PANEL
BUN: 23 mg/dL (ref 6–23)
Chloride: 104 mEq/L (ref 96–112)
Glucose, Bld: 119 mg/dL — ABNORMAL HIGH (ref 70–99)
Potassium: 3.9 mEq/L (ref 3.5–5.1)

## 2012-05-31 ENCOUNTER — Ambulatory Visit: Payer: Medicare Other | Admitting: Family Medicine

## 2012-05-31 ENCOUNTER — Ambulatory Visit (INDEPENDENT_AMBULATORY_CARE_PROVIDER_SITE_OTHER): Payer: Medicare Other | Admitting: Family Medicine

## 2012-05-31 ENCOUNTER — Ambulatory Visit (INDEPENDENT_AMBULATORY_CARE_PROVIDER_SITE_OTHER)
Admission: RE | Admit: 2012-05-31 | Discharge: 2012-05-31 | Disposition: A | Discharge status: Home / Self Care | Payer: Medicare Other | Source: Ambulatory Visit | Attending: Family Medicine | Admitting: Family Medicine

## 2012-05-31 ENCOUNTER — Encounter: Payer: Self-pay | Admitting: Family Medicine

## 2012-05-31 VITALS — BP 138/86 | HR 88 | Temp 98.2°F | Ht 60.5 in | Wt 129.0 lb

## 2012-05-31 DIAGNOSIS — R918 Other nonspecific abnormal finding of lung field: Secondary | ICD-10-CM | POA: Diagnosis not present

## 2012-05-31 DIAGNOSIS — F17200 Nicotine dependence, unspecified, uncomplicated: Secondary | ICD-10-CM

## 2012-05-31 DIAGNOSIS — I2510 Atherosclerotic heart disease of native coronary artery without angina pectoris: Secondary | ICD-10-CM

## 2012-05-31 DIAGNOSIS — I10 Essential (primary) hypertension: Secondary | ICD-10-CM

## 2012-05-31 DIAGNOSIS — R0989 Other specified symptoms and signs involving the circulatory and respiratory systems: Secondary | ICD-10-CM | POA: Diagnosis not present

## 2012-05-31 DIAGNOSIS — Z0000 Encounter for general adult medical examination without abnormal findings: Secondary | ICD-10-CM

## 2012-05-31 DIAGNOSIS — I509 Heart failure, unspecified: Secondary | ICD-10-CM

## 2012-05-31 DIAGNOSIS — R7303 Prediabetes: Secondary | ICD-10-CM

## 2012-05-31 DIAGNOSIS — M810 Age-related osteoporosis without current pathological fracture: Secondary | ICD-10-CM

## 2012-05-31 NOTE — Assessment & Plan Note (Signed)
Chronic, stable.  asxs Consider rpt echo in near future.

## 2012-05-31 NOTE — Assessment & Plan Note (Signed)
Chronic, stable 

## 2012-05-31 NOTE — Assessment & Plan Note (Addendum)
I have personally reviewed the Medicare Annual Wellness questionnaire and have noted 1. The patient's medical and social history 2. Their use of alcohol, tobacco or illicit drugs 3. Their current medications and supplements 4. The patient's functional ability including ADL's, fall risks, home safety risks and hearing or visual impairment. 5. Diet and physical activity 6. Evidence for depression or mood disorders The patients weight, height, BMI have been recorded in the chart.  Hearing and vision has been addressed. I have made referrals, counseling and provided education to the patient based review of the above and I have provided the pt with a written personalized care plan for preventive services. See scanned questionairre. Advanced directives discussed: will bring me DNR forms to next visit.  Reviewed preventative protocols and updated unless pt declined. Declines most preventative protocols. Declines LRCT, but agrees to CXR to eval in longterm smoker. Discussed normal breast exam today but discussed increased sensitivity of mammogram to capture early suspicious changes.  Declines.

## 2012-05-31 NOTE — Assessment & Plan Note (Signed)
New finding - discussed avoiding added sugars and simple carbs, increasing walking.

## 2012-05-31 NOTE — Assessment & Plan Note (Signed)
Intolerant of bisphosphonate. discussed prolia and provided with material to review.

## 2012-05-31 NOTE — Assessment & Plan Note (Signed)
asxs. continue meds.

## 2012-05-31 NOTE — Assessment & Plan Note (Signed)
Continue to encourage cessation. cxr today given extensive smoking history Declines LRCT.

## 2012-05-31 NOTE — Progress Notes (Signed)
Subjective:    Patient ID: Maria Simon, female    DOB: Jan 17, 1934, 79 y.o.   MRN: [id number]  HPI CC: medicare wellness visit  OP - severe.  Started fosamax last year, took for 11 months, then started having groin pain so bisphosphonate was held to see if any improvement.  Leg pain actually improved off fosamax.  Combined systolic and diastolic CHF - last echo was 2006.  H/o thoracic aneurysm repair 2006.  Saw Dr. Pattricia Boss at [facility].  Chronic smoker - started at age 55 yo.  Up to 2 ppd.  65+ ppd hx.    Wt Readings from Last 3 Encounters:  05/31/12 129 lb (58.514 kg)  12/01/11 128 lb 12 oz (58.401 kg)  05/26/11 133 lb (60.328 kg)    Passes hearing screen. Fails vision screen today.  Due for vision screen. Denies falls, depression.  Preventative:  Colon cancer screening - declines colonoscopy.  Declines colon cancer screening.  Denies BM habit changes or blood in stool. Breast cancer screening - declines mammogram.  Aware of risks. DEXA - bone density scan 11/2010 - severe.   Pneumovax completed 2006.  Flu 11/2011. Last tetanus shot unsure. Hasn't had shingles shot - decided to defer. Hasn't had dexa scan in past, does get at least 3 cups milk daily with meals. Advanced directives: has DNR paperwork at home.  Will bring to next office visit.  Does not want life support.  Would want [name] daughter to be HCPOA.  Medications and allergies reviewed and updated in chart.  Past histories reviewed and updated if relevant as below. Patient Active Problem List  Diagnosis  . HLD (hyperlipidemia)  . HTN (hypertension)  . Status post thoracic aortic aneurysm repair  . CHF (congestive heart failure)  . Smoker  . History of TIAs  . CAD (coronary artery disease)  . Osteoporosis  . Prediabetes  . Thrombocytopenia, unspecified   Past Medical History  Diagnosis Date  . Systolic and diastolic CHF, chronic     last ultrasound prior to 2011, EF 10-15%, dilated likely nonischemic  CM with CHF, will need to estabilsh with cards  . HLD (hyperlipidemia)   . HTN (hypertension)   . Status post thoracic aortic aneurysm repair 2006    graft in place, refused CT f/u in past.  . History of chicken pox   . Smoker     ? COPD  . History of TIAs 1998  . CAD (coronary artery disease)     class 1-2 angina  . PAD (peripheral artery disease)   . Osteoporosis     h/o compression fx T11, L5 by CT scan, Dexa 11/2010 -3.9 spine, -3.5 femur   Past Surgical History  Procedure Laterality Date  . Thoracic aortic aneurysm repair  01-11-05    first open then endovascular graft; GORE TAG ref: [id number]; Lot: [id number]  . Appendectomy  1950  . Total abdominal hysterectomy  1973    ovaries out.  reason: bleeding  . Vein ligation and stripping    . Aorta - superior mesenteric artery bypass graft    . Ct scan  07/2005    patent TAA graft, small endoleak, enlarging aneurysm.  patent SMA/celiac artery graft.  stable dissectino of L iliac arteries, emphysema, , osteopenia, compression fx T11, L5, refused further CT scans  . US echocardiography  07/2004    systolic and diastolic dysfunction, EF 10-15%, dilated L vent, CAD, degeneratie mitral valve with regurg, aortic sclerosis, R vent dysfunction, pulm HTN  .  Dexa  11/2010    -3.9 spine, -3.5 femur   History  Substance Use Topics  . Smoking status: Current Every Day Smoker -- 0.50 packs/day for 61 years    Types: Cigarettes  . Smokeless tobacco: Never Used     Comment: 1/2 PPD usually, but sometimes more  . Alcohol Use: No   Family History  Problem Relation Age of Onset  . Cancer Neg Hx   . Diabetes Neg Hx   . Stroke Neg Hx   . Hypertension Neg Hx   . Mental illness Mother     deceased suicide   No Known Allergies Current Outpatient Prescriptions on File Prior to Visit  Medication Sig Dispense Refill  . aspirin 81 MG tablet Take 81 mg by mouth daily.        Marland Kitchen atorvastatin (LIPITOR) 80 MG tablet Take 1 tablet (80 mg total) by mouth  daily.  90 tablet  3  . enalapril (VASOTEC) 20 MG tablet TAKE 1 TABLET BY MOUTH TWICE DAILY  180 tablet  3  . hydrochlorothiazide (MICROZIDE) 12.5 MG capsule TAKE 1 CAPSULE BY MOUTH EVERY DAY  90 capsule  3  . metoprolol (LOPRESSOR) 50 MG tablet TAKE 1 TABLET BY MOUTH TWICE DAILY  180 tablet  3  . alendronate (FOSAMAX) 70 MG tablet TAKE 1 TABLET BY MOUTH EVERY 7 DAYS WITH A FULL GLASS OF WATER ON AN EMPTY STOMACH  4 tablet  0   No current facility-administered medications on file prior to visit.     Review of Systems  Constitutional: Negative for fever, chills, activity change, appetite change, fatigue and unexpected weight change.  HENT: Negative for hearing loss and neck pain.   Eyes: Negative for visual disturbance.  Respiratory: Negative for cough, chest tightness, shortness of breath and wheezing.   Cardiovascular: Negative for chest pain, palpitations and leg swelling.  Gastrointestinal: Positive for nausea, vomiting (recent stomach flu) and diarrhea (recent stomach flu). Negative for abdominal pain, constipation, blood in stool and abdominal distention.  Genitourinary: Negative for hematuria and difficulty urinating.  Musculoskeletal: Negative for myalgias and arthralgias.  Skin: Negative for rash.  Neurological: Negative for dizziness, seizures, syncope and headaches.  Hematological: Bruises/bleeds easily.  Psychiatric/Behavioral: Negative for dysphoric mood. The patient is not nervous/anxious.        Objective:   Physical Exam  Nursing note and vitals reviewed. Constitutional: She is oriented to person, place, and time. She appears well-developed and well-nourished. No distress.  HENT:  Head: Normocephalic and atraumatic.  Right Ear: Hearing, tympanic membrane, external ear and ear canal normal.  Left Ear: Hearing, tympanic membrane, external ear and ear canal normal.  Nose: Nose normal.  Mouth/Throat: Oropharynx is clear and moist. No oropharyngeal exudate.  Eyes:  Conjunctivae and EOM are normal. Pupils are equal, round, and reactive to light. No scleral icterus.  Neck: Normal range of motion. Neck supple. Carotid bruit is not present. No thyromegaly present.  Cardiovascular: Normal rate, regular rhythm, normal heart sounds and intact distal pulses.   No murmur heard. Pulses:      Radial pulses are 2+ on the right side, and 2+ on the left side.  Pulmonary/Chest: Effort normal. No respiratory distress. She has no wheezes. She has rales. Right breast exhibits no inverted nipple, no mass, no nipple discharge, no skin change and no tenderness. Left breast exhibits no inverted nipple, no mass, no nipple discharge, no skin change and no tenderness. Breasts are symmetrical.  Coarse throughout. Faint rales RLL  Abdominal:  Soft. Bowel sounds are normal. She exhibits no distension and no mass. There is no tenderness. There is no rebound and no guarding.  Musculoskeletal: Normal range of motion. She exhibits no edema.  Lymphadenopathy:    She has no cervical adenopathy.    She has no axillary adenopathy.       Right axillary: No lateral adenopathy present.       Left axillary: No lateral adenopathy present.      Right: No supraclavicular adenopathy present.       Left: No supraclavicular adenopathy present.  Neurological: She is alert and oriented to person, place, and time.  CN grossly intact, station and gait intact  Skin: Skin is warm and dry. No rash noted.  Psychiatric: She has a normal mood and affect. Her behavior is normal. Judgment and thought content normal.       Assessment & Plan:

## 2012-05-31 NOTE — Patient Instructions (Addendum)
Schedule eye doctor appointment as you're due and you didn't do so well with vision screen today. Call your insurance about the shingles shot to see if it is covered or how much it would cost and where is cheaper (here or pharmacy).  If you want to receive here, call for nurse visit. Bring DNR forms to next visit to review. Sugar returned a bit high - in prediabetes range.  Watch added sugars and sweets.  Increase walking. Look into prolia - see below.   Denosumab injection What is this medicine? DENOSUMAB slows bone breakdown. It is used to treat osteoporosis in women after menopause and in men. This medicine is also used to prevent bone fractures and other bone problems caused by cancer bone metastases. This medicine may be used for other purposes; ask your health care provider or pharmacist if you have questions. What should I tell my health care provider before I take this medicine? They need to know if you have any of these conditions: -dental disease -eczema -infection or history of infections -kidney disease or on dialysis -low blood calcium or vitamin D -malabsorption syndrome -scheduled to have surgery or tooth extraction -taking medicine that contains denosumab -thyroid or parathyroid disease -an unusual reaction to denosumab, other medicines, foods, dyes, or preservatives -pregnant or trying to get pregnant -breast-feeding How should I use this medicine? This medicine is for injection under the skin. It is given by a health care professional in a hospital or clinic setting. If you are getting Prolia, a special MedGuide will be given to you by the pharmacist with each prescription and refill. Be sure to read this information carefully each time. Talk to your pediatrician regarding the use of this medicine in children. Special care may be needed. Overdosage: If you think you've taken too much of this medicine contact a poison control center or emergency room at  once. Overdosage: If you think you have taken too much of this medicine contact a poison control center or emergency room at once. NOTE: This medicine is only for you. Do not share this medicine with others. What if I miss a dose? It is important not to miss your dose. Call your doctor or health care professional if you are unable to keep an appointment. What may interact with this medicine? Do not take this medicine with any of the following medications: -other medicines containing denosumab This medicine may also interact with the following medications: -medicines that suppress the immune system -medicines that treat cancer -steroid medicines like prednisone or cortisone This list may not describe all possible interactions. Give your health care provider a list of all the medicines, herbs, non-prescription drugs, or dietary supplements you use. Also tell them if you smoke, drink alcohol, or use illegal drugs. Some items may interact with your medicine. What should I watch for while using this medicine? Visit your doctor or health care professional for regular checks on your progress. Your doctor or health care professional may order blood tests and other tests to see how you are doing. Call your doctor or health care professional if you get a cold or other infection while receiving this medicine. Do not treat yourself. This medicine may decrease your body's ability to fight infection. You should make sure you get enough calcium and vitamin D while you are taking this medicine, unless your doctor tells you not to. Discuss the foods you eat and the vitamins you take with your health care professional. See your dentist regularly. Brush and  floss your teeth as directed. Before you have any dental work done, tell your dentist you are receiving this medicine. What side effects may I notice from receiving this medicine? Side effects that you should report to your doctor or health care professional as  soon as possible: -allergic reactions like skin rash, itching or hives, swelling of the face, lips, or tongue -breathing problems -chest pain -fast, irregular heartbeat -feeling faint or lightheaded, falls -fever, chills, or any other sign of infection -muscle spasms, tightening, or twitches -numbness or tingling -skin blisters or bumps, or is dry, peels, or red -slow healing or unexplained pain in the mouth or jaw -unusual bleeding or bruising Side effects that usually do not require medical attention (Report these to your doctor or health care professional if they continue or are bothersome.): -muscle pain -stomach upset, gas This list may not describe all possible side effects. Call your doctor for medical advice about side effects. You may report side effects to FDA at 1-800-FDA-1088. Where should I keep my medicine? This medicine is only given in a clinic, doctor's office, or other health care setting and will not be stored at home. NOTE: This sheet is a summary. It may not cover all possible information. If you have questions about this medicine, talk to your doctor, pharmacist, or health care provider.  2013, [employer]. (11/29/2010 3:40:41 PM)

## 2012-09-09 ENCOUNTER — Other Ambulatory Visit: Payer: Self-pay | Admitting: Family Medicine

## 2012-10-22 ENCOUNTER — Other Ambulatory Visit: Payer: Self-pay | Admitting: Family Medicine

## 2012-10-22 DIAGNOSIS — H2510 Age-related nuclear cataract, unspecified eye: Secondary | ICD-10-CM | POA: Diagnosis not present

## 2012-12-01 ENCOUNTER — Other Ambulatory Visit: Payer: Self-pay | Admitting: Family Medicine

## 2012-12-01 DIAGNOSIS — R7303 Prediabetes: Secondary | ICD-10-CM

## 2012-12-01 DIAGNOSIS — I10 Essential (primary) hypertension: Secondary | ICD-10-CM

## 2012-12-02 ENCOUNTER — Other Ambulatory Visit: Payer: Medicare Other

## 2012-12-06 ENCOUNTER — Ambulatory Visit: Payer: Medicare Other | Admitting: Family Medicine

## 2012-12-09 ENCOUNTER — Other Ambulatory Visit (INDEPENDENT_AMBULATORY_CARE_PROVIDER_SITE_OTHER): Payer: Medicare Other

## 2012-12-09 DIAGNOSIS — I10 Essential (primary) hypertension: Secondary | ICD-10-CM

## 2012-12-09 DIAGNOSIS — R7309 Other abnormal glucose: Secondary | ICD-10-CM

## 2012-12-09 DIAGNOSIS — R7303 Prediabetes: Secondary | ICD-10-CM

## 2012-12-09 LAB — BASIC METABOLIC PANEL
BUN: 23 mg/dL (ref 6–23)
CO2: 28 mEq/L (ref 19–32)
Chloride: 104 mEq/L (ref 96–112)
Creatinine, Ser: 1 mg/dL (ref 0.4–1.2)
GFR: 55.82 mL/min — ABNORMAL LOW (ref >60.00)
Potassium: 4.7 mEq/L (ref 3.5–5.1)

## 2012-12-09 LAB — HEMOGLOBIN A1C: Hgb A1c MFr Bld: 6.5 % (ref 4.6–6.5)

## 2012-12-13 ENCOUNTER — Ambulatory Visit (INDEPENDENT_AMBULATORY_CARE_PROVIDER_SITE_OTHER): Payer: Medicare Other | Admitting: Family Medicine

## 2012-12-13 ENCOUNTER — Encounter: Payer: Self-pay | Admitting: Family Medicine

## 2012-12-13 VITALS — BP 112/74 | HR 80 | Temp 97.9°F | Wt 124.5 lb

## 2012-12-13 DIAGNOSIS — Z23 Encounter for immunization: Secondary | ICD-10-CM

## 2012-12-13 DIAGNOSIS — I10 Essential (primary) hypertension: Secondary | ICD-10-CM | POA: Diagnosis not present

## 2012-12-13 DIAGNOSIS — R7309 Other abnormal glucose: Secondary | ICD-10-CM

## 2012-12-13 DIAGNOSIS — F17200 Nicotine dependence, unspecified, uncomplicated: Secondary | ICD-10-CM

## 2012-12-13 DIAGNOSIS — E785 Hyperlipidemia, unspecified: Secondary | ICD-10-CM

## 2012-12-13 DIAGNOSIS — R7303 Prediabetes: Secondary | ICD-10-CM

## 2012-12-13 DIAGNOSIS — M810 Age-related osteoporosis without current pathological fracture: Secondary | ICD-10-CM | POA: Diagnosis not present

## 2012-12-13 NOTE — Assessment & Plan Note (Signed)
Chronic, stable. Continue 3 antihypertensives.

## 2012-12-13 NOTE — Assessment & Plan Note (Addendum)
Again reviewed limiting added sugars. ?statin related elevated sugar level.

## 2012-12-13 NOTE — Assessment & Plan Note (Signed)
Continue to encourage cessation. precontemplative. 

## 2012-12-13 NOTE — Progress Notes (Signed)
  Subjective:    Patient ID: Maria Simon, female    DOB: [birth year], 79 y.o.   MRN: [id number]  HPI CC: 6 mo f/u  OP - severe. Started fosamax last year, took for 11 months, then started having groin pain so bisphosphonate was held to see if any improvement. Leg pain actually improved off fosamax.  Hesitant to take any other meds for this.  Not on calcium and vit D supplements.  Drinks 3 cups mild/day as well as calcium fortified orange juice.  Vit D was stopped 2/2 elevated levels, we have not rechecked yet.  Chronic smoker - started at age 78 yo. Up to 2 ppd. 65+ ppd hx. 1/2 ppd or less.  Patch didn't help in the past.  HTN - No HA, vision changes, CP/tightness, SOB, leg swelling.  Compliant with 3 meds. Prediabetes - new dx last blood work. Wonders about statin causing elevated sugars.  Combined systolic and diastolic CHF - last echo was 2006.  H/o thoracic aneurysm repair 2006. Saw Dr. Pattricia Boss at [facility].   Past Medical History  Diagnosis Date  . Systolic and diastolic CHF, chronic     last ultrasound prior to 2011, EF 10-15%, dilated likely nonischemic CM with CHF, will need to estabilsh with cards  . HLD (hyperlipidemia)   . HTN (hypertension)   . Status post thoracic aortic aneurysm repair 2006    graft in place, refused CT f/u in past.  . History of chicken pox   . Smoker     ? COPD  . History of TIAs 1998  . CAD (coronary artery disease)     class 1-2 angina  . PAD (peripheral artery disease)   . Osteoporosis     h/o compression fx T11, L5 by CT scan, Dexa 11/2010 -3.9 spine, -3.5 femur  . Prediabetes 05/26/2011    Review of Systems Per HPI    Objective:   Physical Exam  Nursing note and vitals reviewed. Constitutional: She appears well-developed and well-nourished. No distress.  HENT:  Mouth/Throat: Oropharynx is clear and moist. No oropharyngeal exudate.  Cardiovascular: Normal rate, regular rhythm, normal heart sounds and intact distal pulses.   No murmur  heard. Pulmonary/Chest: Effort normal and breath sounds normal. No respiratory distress. She has no wheezes. She has no rales.  coarse  Musculoskeletal: She exhibits no edema.       Assessment & Plan:

## 2012-12-13 NOTE — Assessment & Plan Note (Signed)
Reviewed goal calcium/vit D in diet. Discussed prolia again - pt hesitant.  Will need vit D checked next blood work.

## 2012-12-13 NOTE — Patient Instructions (Addendum)
Flu shot today. Increase dietary vitamin D intake.  That means more fish - mackerel, tuna, salmon, herring, sardines, catfish, cod liver oil, and eggs.  And small doses of sunshine throughout the day. Next blood work we will check vit D level watch sugar and sweet intake - to avoid worsening sugar levels. Return in 6 months for medicare wellness visit.

## 2012-12-13 NOTE — Assessment & Plan Note (Signed)
Chronic, continue high dose lipitor.  Check FLP next blood work.

## 2013-03-10 ENCOUNTER — Other Ambulatory Visit: Payer: Self-pay | Admitting: Family Medicine

## 2013-04-05 ENCOUNTER — Other Ambulatory Visit: Payer: Self-pay | Admitting: Family Medicine

## 2013-06-13 ENCOUNTER — Other Ambulatory Visit: Payer: Medicare Other

## 2013-06-22 ENCOUNTER — Other Ambulatory Visit: Payer: Self-pay | Admitting: Family Medicine

## 2013-06-22 DIAGNOSIS — I10 Essential (primary) hypertension: Secondary | ICD-10-CM

## 2013-06-22 DIAGNOSIS — M810 Age-related osteoporosis without current pathological fracture: Secondary | ICD-10-CM

## 2013-06-22 DIAGNOSIS — R7303 Prediabetes: Secondary | ICD-10-CM

## 2013-06-22 DIAGNOSIS — D696 Thrombocytopenia, unspecified: Secondary | ICD-10-CM

## 2013-06-27 ENCOUNTER — Other Ambulatory Visit (INDEPENDENT_AMBULATORY_CARE_PROVIDER_SITE_OTHER): Payer: Medicare Other

## 2013-06-27 DIAGNOSIS — R7309 Other abnormal glucose: Secondary | ICD-10-CM

## 2013-06-27 DIAGNOSIS — I10 Essential (primary) hypertension: Secondary | ICD-10-CM

## 2013-06-27 DIAGNOSIS — M810 Age-related osteoporosis without current pathological fracture: Secondary | ICD-10-CM | POA: Diagnosis not present

## 2013-06-27 DIAGNOSIS — D696 Thrombocytopenia, unspecified: Secondary | ICD-10-CM

## 2013-06-27 DIAGNOSIS — R7303 Prediabetes: Secondary | ICD-10-CM

## 2013-06-27 LAB — COMPREHENSIVE METABOLIC PANEL
ALBUMIN: 3.8 g/dL (ref 3.5–5.2)
ALT: 21 U/L (ref 0–35)
AST: 32 U/L (ref 0–37)
Alkaline Phosphatase: 76 U/L (ref 39–117)
BUN: 21 mg/dL (ref 6–23)
CO2: 30 mEq/L (ref 19–32)
Calcium: 10.4 mg/dL (ref 8.4–10.5)
Chloride: 105 mEq/L (ref 96–112)
Creatinine, Ser: 0.9 mg/dL (ref 0.4–1.2)
GFR: 61.25 mL/min (ref >60.00)
GLUCOSE: 107 mg/dL — AB (ref 70–99)
Potassium: 4.6 mEq/L (ref 3.5–5.1)
SODIUM: 141 meq/L (ref 135–145)
Total Bilirubin: 0.8 mg/dL (ref 0.3–1.2)
Total Protein: 7.2 g/dL (ref 6.0–8.3)

## 2013-06-27 LAB — CBC WITH DIFFERENTIAL/PLATELET
BASOS PCT: 0.6 % (ref 0.0–3.0)
Basophils Absolute: 0 10*3/uL (ref 0.0–0.1)
Eosinophils Absolute: 0.1 10*3/uL (ref 0.0–0.7)
Eosinophils Relative: 1.1 % (ref 0.0–5.0)
HCT: 42.2 % (ref 36.0–46.0)
Hemoglobin: 14 g/dL (ref 12.0–15.0)
Lymphocytes Relative: 26.1 % (ref 12.0–46.0)
Lymphs Abs: 2.2 10*3/uL (ref 0.7–4.0)
MCHC: 33.3 g/dL (ref 30.0–36.0)
MCV: 84.6 fl (ref 78.0–100.0)
Monocytes Absolute: 0.8 10*3/uL (ref 0.1–1.0)
Monocytes Relative: 9.5 % (ref 3.0–12.0)
Neutro Abs: 5.2 10*3/uL (ref 1.4–7.7)
Neutrophils Relative %: 62.7 % (ref 43.0–77.0)
Platelets: 149 10*3/uL — ABNORMAL LOW (ref 150.0–400.0)
RBC: 4.99 Mil/uL (ref 3.87–5.11)
RDW: 14.8 % — ABNORMAL HIGH (ref 11.5–14.6)
WBC: 8.3 10*3/uL (ref 4.5–10.5)

## 2013-06-27 LAB — HEMOGLOBIN A1C: Hgb A1c MFr Bld: 6.2 % (ref 4.6–6.5)

## 2013-06-27 LAB — LIPID PANEL
CHOL/HDL RATIO: 3
CHOLESTEROL: 107 mg/dL (ref 0–200)
HDL: 37.5 mg/dL — ABNORMAL LOW (ref >39.00)
LDL CALC: 57 mg/dL (ref 0–99)
Triglycerides: 63 mg/dL (ref 0.0–149.0)
VLDL: 12.6 mg/dL (ref 0.0–40.0)

## 2013-06-28 LAB — VITAMIN D 25 HYDROXY (VIT D DEFICIENCY, FRACTURES): Vit D, 25-Hydroxy: 56 ng/mL (ref 30–89)

## 2013-06-30 ENCOUNTER — Ambulatory Visit: Payer: Medicare Other | Admitting: Family Medicine

## 2013-07-04 ENCOUNTER — Ambulatory Visit (INDEPENDENT_AMBULATORY_CARE_PROVIDER_SITE_OTHER): Payer: Medicare Other | Admitting: Family Medicine

## 2013-07-04 ENCOUNTER — Encounter: Payer: Self-pay | Admitting: Family Medicine

## 2013-07-04 VITALS — BP 142/82 | HR 80 | Temp 98.1°F | Wt 126.2 lb

## 2013-07-04 DIAGNOSIS — R7303 Prediabetes: Secondary | ICD-10-CM

## 2013-07-04 DIAGNOSIS — E785 Hyperlipidemia, unspecified: Secondary | ICD-10-CM | POA: Diagnosis not present

## 2013-07-04 DIAGNOSIS — I10 Essential (primary) hypertension: Secondary | ICD-10-CM | POA: Diagnosis not present

## 2013-07-04 DIAGNOSIS — F17200 Nicotine dependence, unspecified, uncomplicated: Secondary | ICD-10-CM

## 2013-07-04 DIAGNOSIS — M810 Age-related osteoporosis without current pathological fracture: Secondary | ICD-10-CM | POA: Diagnosis not present

## 2013-07-04 DIAGNOSIS — R7309 Other abnormal glucose: Secondary | ICD-10-CM

## 2013-07-04 NOTE — Assessment & Plan Note (Signed)
Chronic, stable. Continue meds. 

## 2013-07-04 NOTE — Progress Notes (Signed)
Pre visit review using our clinic review tool, if applicable. No additional management support is needed unless otherwise documented below in the visit note. 

## 2013-07-04 NOTE — Assessment & Plan Note (Signed)
Pt declines further treatment. Consider discussing SERM next visit.

## 2013-07-04 NOTE — Progress Notes (Signed)
BP 142/82  Pulse 80  Temp(Src) 98.1 F (36.7 C) (Oral)  Wt 126 lb 4 oz (57.267 kg)   CC: 6 mo f/u  Subjective:    Patient ID: Maria Simon, female    DOB: 1934/03/01, 80 y.o.   MRN: [id number]  HPI: Maria Simon is a 80 y.o. female presenting on 07/04/2013 for Follow-up   Rough month.  Oldest daughter passed away.  Hip replacements with complication of sepsis.  80 yo.  Smoking - 1/2 ppd.  Relaxes her.other relaxation techniques - reading and crochet.  HTN - compliant with meds.  bp stable. HLD - compliant with statin.  Appetite ok.  Denies weight changes Wt Readings from Last 3 Encounters:  07/04/13 126 lb 4 oz (57.267 kg)  12/13/12 124 lb 8 oz (56.473 kg)  05/31/12 129 lb (58.514 kg)    Relevant past medical, surgical, family and social history reviewed and updated as indicated.  Allergies and medications reviewed and updated. Current Outpatient Prescriptions on File Prior to Visit  Medication Sig  . aspirin 81 MG tablet Take 81 mg by mouth daily.   Marland Kitchen atorvastatin (LIPITOR) 80 MG tablet TAKE 1 TABLET BY MOUTH EVERY DAY  . enalapril (VASOTEC) 20 MG tablet TAKE 1 TABLET BY MOUTH TWICE DAILY  . hydrochlorothiazide (MICROZIDE) 12.5 MG capsule TAKE 1 CAPSULE BY MOUTH EVERY DAY  . metoprolol (LOPRESSOR) 50 MG tablet TAKE 1 TABLET BY MOUTH TWICE DAILY  . Multiple Vitamin (MULTIVITAMIN) tablet Take 1 tablet by mouth daily. Centrum Silver   No current facility-administered medications on file prior to visit.    Review of Systems Per HPI unless specifically indicated above    Objective:    BP 142/82  Pulse 80  Temp(Src) 98.1 F (36.7 C) (Oral)  Wt 126 lb 4 oz (57.267 kg)  Physical Exam  Nursing note and vitals reviewed. Constitutional: She appears well-developed and well-nourished. No distress.  HENT:  Mouth/Throat: Oropharynx is clear and moist. No oropharyngeal exudate.  Neck: Normal range of motion. Neck supple. Carotid bruit is not present.    Cardiovascular: Normal rate, regular rhythm, normal heart sounds and intact distal pulses.   No murmur heard. Pulmonary/Chest: Effort normal and breath sounds normal. No respiratory distress. She has no wheezes. She has no rales.  Musculoskeletal: She exhibits no edema.  Lymphadenopathy:    She has no cervical adenopathy.  Skin: Skin is warm and dry.   Results for orders placed in visit on 06/27/13  HEMOGLOBIN A1C      Result Value Ref Range   Hemoglobin A1C 6.2  4.6 - 6.5 %  CBC WITH DIFFERENTIAL      Result Value Ref Range   WBC 8.3  4.5 - 10.5 K/uL   RBC 4.99  3.87 - 5.11 Mil/uL   Hemoglobin 14.0  12.0 - 15.0 g/dL   HCT 42.2  36.0 - 46.0 %   MCV 84.6  78.0 - 100.0 fl   MCHC 33.3  30.0 - 36.0 g/dL   RDW 14.8 (*) 11.5 - 14.6 %   Platelets 149.0 (*) 150.0 - 400.0 K/uL   Neutrophils Relative % 62.7  43.0 - 77.0 %   Lymphocytes Relative 26.1  12.0 - 46.0 %   Monocytes Relative 9.5  3.0 - 12.0 %   Eosinophils Relative 1.1  0.0 - 5.0 %   Basophils Relative 0.6  0.0 - 3.0 %   Neutro Abs 5.2  1.4 - 7.7 K/uL   Lymphs Abs 2.2  0.7 - 4.0 K/uL   Monocytes Absolute 0.8  0.1 - 1.0 K/uL   Eosinophils Absolute 0.1  0.0 - 0.7 K/uL   Basophils Absolute 0.0  0.0 - 0.1 K/uL  VITAMIN D 25 HYDROXY      Result Value Ref Range   Vit D, 25-Hydroxy 56  30 - 89 ng/mL  COMPREHENSIVE METABOLIC PANEL      Result Value Ref Range   Sodium 141  135 - 145 mEq/L   Potassium 4.6  3.5 - 5.1 mEq/L   Chloride 105  96 - 112 mEq/L   CO2 30  19 - 32 mEq/L   Glucose, Bld 107 (*) 70 - 99 mg/dL   BUN 21  6 - 23 mg/dL   Creatinine, Ser 0.9  0.4 - 1.2 mg/dL   Total Bilirubin 0.8  0.3 - 1.2 mg/dL   Alkaline Phosphatase 76  39 - 117 U/L   AST 32  0 - 37 U/L   ALT 21  0 - 35 U/L   Total Protein 7.2  6.0 - 8.3 g/dL   Albumin 3.8  3.5 - 5.2 g/dL   Calcium 10.4  8.4 - 10.5 mg/dL   GFR 61.25  >60.00 mL/min  LIPID PANEL      Result Value Ref Range   Cholesterol 107  0 - 200 mg/dL   Triglycerides 63.0  0.0 -  149.0 mg/dL   HDL 37.50 (*) >39.00 mg/dL   VLDL 12.6  0.0 - 40.0 mg/dL   LDL Cholesterol 57  0 - 99 mg/dL   Total CHOL/HDL Ratio 3        Assessment & Plan:   Problem List Items Addressed This Visit   HLD (hyperlipidemia) - Primary     Chronic, stable. Continue high dose lipitor.    HTN (hypertension)     Chronic, stable. Continue meds.    Smoker     Continue to encourage smoking cessation.  Doubt she will change, stays precontemplative    Osteoporosis     Pt declines further treatment. Consider discussing SERM next visit.    Prediabetes     Chronic, stable. Continue monitoring diet.        Follow up plan: Return in about 6 months (around 01/04/2014), or as needed, for medicare wellness exam.

## 2013-07-04 NOTE — Assessment & Plan Note (Signed)
Continue to encourage smoking cessation.  Doubt she will change, stays precontemplative

## 2013-07-04 NOTE — Patient Instructions (Signed)
Good to see you today, call us with questions. Return in 5-6 months for wellness exam. Second pneumonia shot at that time.

## 2013-07-04 NOTE — Assessment & Plan Note (Signed)
Chronic, stable. Continue monitoring diet  

## 2013-07-04 NOTE — Assessment & Plan Note (Signed)
Chronic, stable. Continue high dose lipitor.  

## 2013-07-07 ENCOUNTER — Telehealth: Payer: Self-pay | Admitting: Family Medicine

## 2013-07-07 NOTE — Telephone Encounter (Signed)
Relevant patient education mailed to patient.  

## 2013-09-01 ENCOUNTER — Telehealth: Payer: Self-pay | Admitting: Family Medicine

## 2013-09-01 MED ORDER — BENZONATATE 100 MG PO CAPS: 100.0000 mg | ORAL_CAPSULE | Freq: Two times a day (BID) | ORAL | Status: DC | PRN

## 2013-09-01 NOTE — Telephone Encounter (Signed)
plz notify tessalon perls sent to pharmacy. Come in for office visit if persistent or worsening cough.

## 2013-09-01 NOTE — Telephone Encounter (Signed)
Left detailed message on voicemail.  

## 2013-09-01 NOTE — Telephone Encounter (Signed)
Patient Information:  Caller Name: Makynlie  Phone: [phone]  Patient: Maria Simon, Maria Simon  Gender: Female  DOB: March 15, 1933  Age: 80 Years  PCP: [facility])  Office Follow Up:  Does the office need to follow up with this patient?: Yes  Instructions For The Office: Pls see RN note  RN Note:  Productive Cough, clear sputum, onset 6-26.  Cough keeps Pt awake at night, no chest pain or breathing problems.  Afebrile.  All emergent sxs ruled out per Cough protocol, see today or tomorrow d/t continuous coughing interferes w/ ADL's.  Pt would like to ask Dr Danise Mina for [place] to help her sleep at night.  Last OV was 07-04-13. Pt uses [facility], info in [place].  Dicussed continuing Mucinex, add honey w/ herbal tea.  Please review RX request w/ Dr Danise Mina and f/u w/ Pt.  Symptoms  Reason For Call & Symptoms: Productive Cough, clear sputum, onset 6-26.  Reviewed Health History In EMR: Yes  Reviewed Medications In EMR: Yes  Reviewed Allergies In EMR: Yes  Reviewed Surgeries / Procedures: Yes  Date of Onset of Symptoms: 08/29/2013  Treatments Tried: Mucinex  Treatments Tried Worked: No  Guideline(s) Used:  Cough  Disposition Per Guideline:   See Today or Tomorrow in Office  Reason For Disposition Reached:   Continuous (nonstop) coughing interferes with work or school and no improvement using cough treatment per Care Advice  Advice Given:  N/A  Patient Will Follow Care Advice:  YES

## 2013-09-04 ENCOUNTER — Other Ambulatory Visit: Payer: Self-pay | Admitting: Family Medicine

## 2013-10-02 ENCOUNTER — Other Ambulatory Visit: Payer: Self-pay | Admitting: Family Medicine

## 2013-10-21 ENCOUNTER — Other Ambulatory Visit: Payer: Self-pay | Admitting: Family Medicine

## 2013-12-02 ENCOUNTER — Encounter: Payer: Self-pay | Admitting: Family Medicine

## 2013-12-02 ENCOUNTER — Other Ambulatory Visit: Payer: Self-pay | Admitting: Family Medicine

## 2013-12-05 ENCOUNTER — Other Ambulatory Visit: Payer: Medicare Other

## 2013-12-05 ENCOUNTER — Ambulatory Visit: Payer: Medicare Other | Admitting: Family Medicine

## 2013-12-12 ENCOUNTER — Ambulatory Visit (INDEPENDENT_AMBULATORY_CARE_PROVIDER_SITE_OTHER): Payer: Medicare Other | Admitting: Family Medicine

## 2013-12-12 ENCOUNTER — Encounter: Payer: Self-pay | Admitting: Family Medicine

## 2013-12-12 VITALS — BP 124/64 | HR 80 | Temp 98.4°F | Wt 123.2 lb

## 2013-12-12 DIAGNOSIS — Z7189 Other specified counseling: Secondary | ICD-10-CM

## 2013-12-12 DIAGNOSIS — Z23 Encounter for immunization: Secondary | ICD-10-CM

## 2013-12-12 NOTE — Addendum Note (Signed)
Addended by: Royann Shivers A on: 12/12/2013 12:57 PM   Modules accepted: Orders

## 2013-12-12 NOTE — Patient Instructions (Signed)
Flu shot and prevnar today. Nurse visit today only. Return in 1 month for labwork fasting and then medicare wellness visit.

## 2013-12-12 NOTE — Assessment & Plan Note (Addendum)
Brings advanced directive today. Maria Simon and Maria Simon. Would not want prolonged life support, does not want feeding tube. Has discussed wants DNR in past but this has not been set up.

## 2013-12-12 NOTE — Progress Notes (Signed)
   BP 124/64  Pulse 80  Temp(Src) 98.4 F (36.9 C) (Oral)  Wt 123 lb 4 oz (55.906 kg)   CC: 5 mo f/u  Subjective:    Patient ID: Maria Simon, female    DOB: [birth year], 80 y.o.   MRN: [id number]  HPI: [name] is a 80 y.o. female presenting on 12/12/2013 for Follow-up   Was to come in for medicare wellness visit, but scheduled as f/u instead. Medicare wellness visit scheduled for next month.  Overall doing well today.  BP Readings from Last 3 Encounters:  12/12/13 124/64  07/04/13 142/82  12/13/12 112/74   Wt Readings from Last 3 Encounters:  12/12/13 123 lb 4 oz (55.906 kg)  07/04/13 126 lb 4 oz (57.267 kg)  12/13/12 124 lb 8 oz (56.473 kg)   Body mass index is 23.67 kg/(m^2).   Relevant past medical, surgical, family and social history reviewed and updated as indicated.  Allergies and medications reviewed and updated. Current Outpatient Prescriptions on File Prior to Visit  Medication Sig  . aspirin 81 MG tablet Take 81 mg by mouth daily.   Marland Kitchen atorvastatin (LIPITOR) 80 MG tablet Take 1 tablet (80 mg total) by mouth daily.  . enalapril (VASOTEC) 20 MG tablet TAKE 1 TABLET BY MOUTH TWICE DAILY  . hydrochlorothiazide (MICROZIDE) 12.5 MG capsule Take 1 capsule (12.5 mg total) by mouth daily.  . metoprolol (LOPRESSOR) 50 MG tablet TAKE 1 TABLET BY MOUTH TWICE DAILY  . Multiple Vitamin (MULTIVITAMIN) tablet Take 1 tablet by mouth daily. Centrum Silver  . benzonatate (TESSALON) 100 MG capsule Take 1 capsule (100 mg total) by mouth 2 (two) times daily as needed for cough.   No current facility-administered medications on file prior to visit.    Review of Systems Per HPI unless specifically indicated above    Objective:    BP 124/64  Pulse 80  Temp(Src) 98.4 F (36.9 C) (Oral)  Wt 123 lb 4 oz (55.906 kg)  Physical Exam deferred    Assessment & Plan:  Nurse visit alone today for prevnar and flu shot. AMW to be scheduled next month with fasting labwork. No  acute issues today. Brings living will which we will scan into system. Problem List Items Addressed This Visit   Advanced care planning/counseling discussion - Primary     Brings advanced directive today. Maria Simon and Maria Simon. Would not want prolonged life support, does not want feeding tube.        Follow up plan: Return in about 1 month (around 01/12/2014), or as needed, for medicare wellness.

## 2013-12-12 NOTE — Progress Notes (Signed)
Pre visit review using our clinic review tool, if applicable. No additional management support is needed unless otherwise documented below in the visit note. 

## 2014-01-16 ENCOUNTER — Other Ambulatory Visit (INDEPENDENT_AMBULATORY_CARE_PROVIDER_SITE_OTHER): Payer: Medicare Other

## 2014-01-16 ENCOUNTER — Other Ambulatory Visit: Payer: Self-pay | Admitting: Family Medicine

## 2014-01-16 DIAGNOSIS — R7309 Other abnormal glucose: Secondary | ICD-10-CM

## 2014-01-16 DIAGNOSIS — R7303 Prediabetes: Secondary | ICD-10-CM

## 2014-01-16 LAB — BASIC METABOLIC PANEL
BUN: 21 mg/dL (ref 6–23)
CO2: 27 meq/L (ref 19–32)
CREATININE: 0.8 mg/dL (ref 0.4–1.2)
Calcium: 10.5 mg/dL (ref 8.4–10.5)
Chloride: 105 mEq/L (ref 96–112)
GFR: 69.64 mL/min (ref >60.00)
Glucose, Bld: 121 mg/dL — ABNORMAL HIGH (ref 70–99)
Potassium: 4.8 mEq/L (ref 3.5–5.1)
Sodium: 142 mEq/L (ref 135–145)

## 2014-01-16 LAB — HEMOGLOBIN A1C: Hgb A1c MFr Bld: 6.3 % (ref 4.6–6.5)

## 2014-01-18 ENCOUNTER — Other Ambulatory Visit: Payer: Self-pay | Admitting: Family Medicine

## 2014-01-23 ENCOUNTER — Ambulatory Visit (INDEPENDENT_AMBULATORY_CARE_PROVIDER_SITE_OTHER): Payer: Medicare Other | Admitting: Family Medicine

## 2014-01-23 ENCOUNTER — Encounter: Payer: Self-pay | Admitting: Family Medicine

## 2014-01-23 VITALS — BP 116/78 | HR 100 | Temp 98.1°F | Ht 60.5 in | Wt 125.2 lb

## 2014-01-23 DIAGNOSIS — F17200 Nicotine dependence, unspecified, uncomplicated: Secondary | ICD-10-CM

## 2014-01-23 DIAGNOSIS — Z0000 Encounter for general adult medical examination without abnormal findings: Secondary | ICD-10-CM | POA: Diagnosis not present

## 2014-01-23 DIAGNOSIS — R7303 Prediabetes: Secondary | ICD-10-CM

## 2014-01-23 DIAGNOSIS — M810 Age-related osteoporosis without current pathological fracture: Secondary | ICD-10-CM

## 2014-01-23 DIAGNOSIS — E785 Hyperlipidemia, unspecified: Secondary | ICD-10-CM

## 2014-01-23 DIAGNOSIS — I2510 Atherosclerotic heart disease of native coronary artery without angina pectoris: Secondary | ICD-10-CM

## 2014-01-23 DIAGNOSIS — I509 Heart failure, unspecified: Secondary | ICD-10-CM

## 2014-01-23 DIAGNOSIS — I10 Essential (primary) hypertension: Secondary | ICD-10-CM

## 2014-01-23 DIAGNOSIS — Z7189 Other specified counseling: Secondary | ICD-10-CM

## 2014-01-23 DIAGNOSIS — Z66 Do not resuscitate: Secondary | ICD-10-CM

## 2014-01-23 NOTE — Assessment & Plan Note (Signed)
asxs. 

## 2014-01-23 NOTE — Progress Notes (Signed)
Pre visit review using our clinic review tool, if applicable. No additional management support is needed unless otherwise documented below in the visit note. 

## 2014-01-23 NOTE — Patient Instructions (Signed)
Work on quitting smoking - best thing you can do for bone density. Continue weight bearing exercises. DNR and MOST forms filled out today. Keep at an accessible area of the house. Good to see you today, call us with questions.

## 2014-01-23 NOTE — Assessment & Plan Note (Signed)
Continue to encourage smoking cessation. 

## 2014-01-23 NOTE — Assessment & Plan Note (Signed)
Chronic, stable. Continue lipitor.  

## 2014-01-23 NOTE — Assessment & Plan Note (Signed)
Chronic, stable. Continue regimen. 

## 2014-01-23 NOTE — Assessment & Plan Note (Signed)
Chronic, stable. Asxs. Consider echo next visit.

## 2014-01-23 NOTE — Assessment & Plan Note (Addendum)

## 2014-01-23 NOTE — Progress Notes (Signed)
BP 116/78 mmHg  Pulse 100  Temp(Src) 98.1 F (36.7 C) (Oral)  Ht 5' 0.5" (1.537 m)  Wt 125 lb 4 oz (56.813 kg)  BMI 24.05 kg/m2   CC: 3 mo f/u visit  Subjective:    Patient ID: Maria Simon, female    DOB: 1933-09-10, 80 y.o.   MRN: [id number]  HPI: LINDELL TUSSEY is a 80 y.o. female presenting on 01/23/2014 for Annual Exam   Severe osteoporosis - fosamax caused groin pain (took for 11 months). Pt declined further treatment previously. Drinks 3 cups milk/day as well as calcium fortified orange juice. Vit D was stopped 2/2 elevated levels, on recheck 56 (06/2013)   Continues smoking 1/2 ppd.   Passes hearing screen. Eye exam done this year. Denies falls, depression/anhedonia.  Preventative: Colon cancer screening - declines colonoscopy. Declines colon cancer screening. Denies BM habit changes or blood in stool.  Breast cancer screening - declines mammogram. Aware of risks. DEXA - bone density scan 11/2010 - severe. Pneumovax 2006, 2015 Flu 12/2013 Last tetanus shot thinks 2006 Declines zostavax Advanced directives: scanned into chart (12/2013) [place] and [place]. Would not want prolonged life support, does not want feeding tube. DNR and MOST form filled out today and asked to scan into chart.  Caffeine: 2-3 coffee cups/day Lives alone, no pets. Daughter ([name]) nearby, granddaughter nearby Edu: 10th grade Activity: yardwork Diet: some water, fruits/vegetables daily  Relevant past medical, surgical, family and social history reviewed and updated as indicated.  Allergies and medications reviewed and updated. Current Outpatient Prescriptions on File Prior to Visit  Medication Sig  . aspirin 81 MG tablet Take 81 mg by mouth daily.   Marland Kitchen atorvastatin (LIPITOR) 80 MG tablet Take 1 tablet (80 mg total) by mouth daily.  . benzonatate (TESSALON) 100 MG capsule Take 1 capsule (100 mg total) by mouth 2 (two) times daily as needed for cough.  . enalapril  (VASOTEC) 20 MG tablet TAKE 1 TABLET BY MOUTH TWICE DAILY  . hydrochlorothiazide (MICROZIDE) 12.5 MG capsule Take 1 capsule (12.5 mg total) by mouth daily.  . metoprolol (LOPRESSOR) 50 MG tablet TAKE 1 TABLET BY MOUTH TWICE DAILY  . Multiple Vitamin (MULTIVITAMIN) tablet Take 1 tablet by mouth daily. Centrum Silver   No current facility-administered medications on file prior to visit.    Review of Systems Per HPI unless specifically indicated above    Objective:    BP 116/78 mmHg  Pulse 100  Temp(Src) 98.1 F (36.7 C) (Oral)  Ht 5' 0.5" (1.537 m)  Wt 125 lb 4 oz (56.813 kg)  BMI 24.05 kg/m2  Physical Exam  Constitutional: She is oriented to person, place, and time. She appears well-developed and well-nourished. No distress.  HENT:  Head: Normocephalic and atraumatic.  Right Ear: Hearing, tympanic membrane, external ear and ear canal normal.  Left Ear: Hearing, tympanic membrane, external ear and ear canal normal.  Nose: Nose normal.  Mouth/Throat: Uvula is midline, oropharynx is clear and moist and mucous membranes are normal. No oropharyngeal exudate, posterior oropharyngeal edema or posterior oropharyngeal erythema.  Eyes: Conjunctivae and EOM are normal. Pupils are equal, round, and reactive to light. No scleral icterus.  Neck: Normal range of motion. Neck supple. Carotid bruit is not present. No thyromegaly present.  Cardiovascular: Normal rate, regular rhythm, normal heart sounds and intact distal pulses.   No murmur heard. Pulses:      Radial pulses are 2+ on the right side, and 2+ on  the left side.  Pulmonary/Chest: Effort normal and breath sounds normal. No respiratory distress. She has no wheezes. She has no rales.  Abdominal: Soft. Bowel sounds are normal. She exhibits no distension and no mass. There is no tenderness. There is no rebound and no guarding.  Musculoskeletal: Normal range of motion. She exhibits no edema.  Lymphadenopathy:    She has no cervical  adenopathy.  Neurological: She is alert and oriented to person, place, and time.  CN grossly intact, station and gait intact Recall 3/3  Calculation 5/5 serial 3s  Skin: Skin is warm and dry. No rash noted.  Psychiatric: She has a normal mood and affect. Her behavior is normal. Judgment and thought content normal.  Nursing note and vitals reviewed.  Results for orders placed or performed in visit on 19/50/93  Basic metabolic panel  Result Value Ref Range   Sodium 142 135 - 145 mEq/L   Potassium 4.8 3.5 - 5.1 mEq/L   Chloride 105 96 - 112 mEq/L   CO2 27 19 - 32 mEq/L   Glucose, Bld 121 (H) 70 - 99 mg/dL   BUN 21 6 - 23 mg/dL   Creatinine, Ser 0.8 0.4 - 1.2 mg/dL   Calcium 10.5 8.4 - 10.5 mg/dL   GFR 69.64 >60.00 mL/min  Hemoglobin A1c  Result Value Ref Range   Hgb A1c MFr Bld 6.3 4.6 - 6.5 %      Assessment & Plan:   Problem List Items Addressed This Visit    Smoker    Continue to encourage smoking cessation.    Prediabetes    Reviewed A1c with patient.    Osteoporosis    Completed 11 mo fosamax treatment, now getting recommended amt calcium in diet. Declines further osteoporosis treatment.    Medicare annual wellness visit, subsequent - Primary    I have personally reviewed the Medicare Annual Wellness questionnaire and have noted 1. The patient's medical and social history 2. Their use of alcohol, tobacco or illicit drugs 3. Their current medications and supplements 4. The patient's functional ability including ADL's, fall risks, home safety risks and hearing or visual impairment. 5. Diet and physical activity 6. Evidence for depression or mood disorders The patients weight, height, BMI have been recorded in the chart.  Hearing and vision has been addressed. I have made referrals, counseling and provided education to the patient based review of the above and I have provided the pt with a written personalized care plan for preventive services. Provider list updated -  see scanned questionairre.  Reviewed preventative protocols and updated unless pt declined.     HTN (hypertension)    Chronic, stable . Continue regimen.    HLD (hyperlipidemia)    Chronic, stable. Continue lipitor.    DNR (do not resuscitate)   CHF (congestive heart failure)    Chronic, stable. Asxs. Consider echo next visit.    CAD (coronary artery disease)    asxs.    Advanced care planning/counseling discussion    Advanced directives: scanned into chart (12/2013) [place] and [place]. Would not want prolonged life support, does not want feeding tube.        Follow up plan: Return in about 8 months (around 09/23/2014), or as needed, for follow up visit.

## 2014-01-23 NOTE — Assessment & Plan Note (Signed)
Advanced directives: scanned into chart (12/2013) Maria Simon and Maria Simon. Would not want prolonged life support, does not want feeding tube.

## 2014-01-23 NOTE — Assessment & Plan Note (Signed)
Completed 11 mo fosamax treatment, now getting recommended amt calcium in diet. Declines further osteoporosis treatment.

## 2014-01-23 NOTE — Assessment & Plan Note (Signed)
Reviewed A1c with patient.  

## 2014-03-03 ENCOUNTER — Other Ambulatory Visit: Payer: Self-pay | Admitting: Family Medicine

## 2014-09-23 ENCOUNTER — Other Ambulatory Visit: Payer: Self-pay | Admitting: Family Medicine

## 2014-10-14 ENCOUNTER — Other Ambulatory Visit: Payer: Self-pay | Admitting: Family Medicine

## 2014-10-14 DIAGNOSIS — M810 Age-related osteoporosis without current pathological fracture: Secondary | ICD-10-CM

## 2014-10-14 DIAGNOSIS — R7303 Prediabetes: Secondary | ICD-10-CM

## 2014-10-14 DIAGNOSIS — E785 Hyperlipidemia, unspecified: Secondary | ICD-10-CM

## 2014-10-16 ENCOUNTER — Other Ambulatory Visit (INDEPENDENT_AMBULATORY_CARE_PROVIDER_SITE_OTHER): Payer: Medicare Other

## 2014-10-16 DIAGNOSIS — R7303 Prediabetes: Secondary | ICD-10-CM

## 2014-10-16 DIAGNOSIS — M810 Age-related osteoporosis without current pathological fracture: Secondary | ICD-10-CM | POA: Diagnosis not present

## 2014-10-16 DIAGNOSIS — E785 Hyperlipidemia, unspecified: Secondary | ICD-10-CM | POA: Diagnosis not present

## 2014-10-16 DIAGNOSIS — R7309 Other abnormal glucose: Secondary | ICD-10-CM | POA: Diagnosis not present

## 2014-10-16 LAB — LIPID PANEL
CHOL/HDL RATIO: 4
Cholesterol: 115 mg/dL (ref 0–200)
HDL: 32 mg/dL — ABNORMAL LOW (ref >39.00)
LDL Cholesterol: 64 mg/dL (ref 0–99)
NONHDL: 82.86
Triglycerides: 94 mg/dL (ref 0.0–149.0)
VLDL: 18.8 mg/dL (ref 0.0–40.0)

## 2014-10-16 LAB — BASIC METABOLIC PANEL
BUN: 24 mg/dL — AB (ref 6–23)
CO2: 30 meq/L (ref 19–32)
Calcium: 10.7 mg/dL — ABNORMAL HIGH (ref 8.4–10.5)
Chloride: 104 mEq/L (ref 96–112)
Creatinine, Ser: 1.06 mg/dL (ref 0.40–1.20)
GFR: 53.14 mL/min — ABNORMAL LOW (ref >60.00)
Glucose, Bld: 122 mg/dL — ABNORMAL HIGH (ref 70–99)
Potassium: 5.3 mEq/L — ABNORMAL HIGH (ref 3.5–5.1)
Sodium: 141 mEq/L (ref 135–145)

## 2014-10-16 LAB — HEMOGLOBIN A1C: HEMOGLOBIN A1C: 6.2 % (ref 4.6–6.5)

## 2014-10-16 LAB — VITAMIN D 25 HYDROXY (VIT D DEFICIENCY, FRACTURES): VITD: 34.58 ng/mL (ref 30.00–100.00)

## 2014-10-23 ENCOUNTER — Encounter: Payer: Self-pay | Admitting: Family Medicine

## 2014-10-23 ENCOUNTER — Ambulatory Visit (INDEPENDENT_AMBULATORY_CARE_PROVIDER_SITE_OTHER): Payer: Medicare Other | Admitting: Family Medicine

## 2014-10-23 VITALS — BP 118/72 | HR 84 | Temp 98.1°F | Wt 122.2 lb

## 2014-10-23 DIAGNOSIS — Z720 Tobacco use: Secondary | ICD-10-CM

## 2014-10-23 DIAGNOSIS — I10 Essential (primary) hypertension: Secondary | ICD-10-CM | POA: Diagnosis not present

## 2014-10-23 DIAGNOSIS — Z9889 Other specified postprocedural states: Secondary | ICD-10-CM | POA: Diagnosis not present

## 2014-10-23 DIAGNOSIS — F17200 Nicotine dependence, unspecified, uncomplicated: Secondary | ICD-10-CM

## 2014-10-23 DIAGNOSIS — M810 Age-related osteoporosis without current pathological fracture: Secondary | ICD-10-CM

## 2014-10-23 DIAGNOSIS — Z8679 Personal history of other diseases of the circulatory system: Secondary | ICD-10-CM

## 2014-10-23 LAB — BASIC METABOLIC PANEL
BUN: 21 mg/dL (ref 7–25)
CO2: 25 mmol/L (ref 20–31)
Calcium: 10.2 mg/dL (ref 8.6–10.4)
Chloride: 101 mmol/L (ref 98–110)
Creat: 0.99 mg/dL — ABNORMAL HIGH (ref 0.60–0.93)
GLUCOSE: 110 mg/dL — AB (ref 65–99)
Potassium: 4.8 mmol/L (ref 3.5–5.3)
Sodium: 139 mmol/L (ref 135–146)

## 2014-10-23 NOTE — Assessment & Plan Note (Signed)
bp remaining stable on once daily enalapril - continue. Recheck K today.

## 2014-10-23 NOTE — Progress Notes (Signed)
BP 118/72 mmHg  Pulse 84  Temp(Src) 98.1 F (36.7 C) (Oral)  Wt 122 lb 4 oz (55.452 kg)  SpO2 95%   CC: 8 mo f/u visit  Subjective:    Patient ID: Maria Simon, female    DOB: [birth year], 81 y.o.   MRN: [id number]  HPI: Maria Simon is a 81 y.o. female presenting on 10/23/2014 for Follow-up and Fatigue   Severe osteoporosis with T -3.9 spine. Fosamax caused groin pain (took for 11 months). Drinks 3 cups milk/day as well as calcium fortified orange juice. Vit D was stopped 2/2 elevated levels, on recheck 56 (06/2013). Pt declines further treatment.   HTN - Compliant with current antihypertensive regimen of enalapril 20mg  and hctz 12.5mg  daily, metoprolol 50mg  bid. Does not check blood pressures at home. No low blood pressure symptoms of dizziness/syncope.  Denies HA, vision changes, CP/tightness, SOB, leg swelling.  Appetite decreased.   Continued smoker 1/2 ppd. Has tried patch in the past. Doesn't want to retry patch or try any medication.   Relevant past medical, surgical, family and social history reviewed and updated as indicated. Interim medical history since our last visit reviewed. Allergies and medications reviewed and updated. Current Outpatient Prescriptions on File Prior to Visit  Medication Sig  . aspirin 81 MG tablet Take 81 mg by mouth daily.   Marland Kitchen atorvastatin (LIPITOR) 80 MG tablet TAKE 1 TABLET BY MOUTH DAILY.  Marland Kitchen enalapril (VASOTEC) 20 MG tablet TAKE 1 TABLET BY MOUTH TWICE DAILY (Patient taking differently: take 1 tablet by mouth daily)  . hydrochlorothiazide (MICROZIDE) 12.5 MG capsule TAKE 1 CAPSULE BY MOUTH DAILY.  . metoprolol (LOPRESSOR) 50 MG tablet TAKE 1 TABLET BY MOUTH TWICE DAILY  . Multiple Vitamin (MULTIVITAMIN) tablet Take 1 tablet by mouth daily. Centrum Silver   No current facility-administered medications on file prior to visit.    Review of Systems Per HPI unless specifically indicated above     Objective:    BP 118/72 mmHg  Pulse 84   Temp(Src) 98.1 F (36.7 C) (Oral)  Wt 122 lb 4 oz (55.452 kg)  SpO2 95%  Wt Readings from Last 3 Encounters:  10/23/14 122 lb 4 oz (55.452 kg)  01/23/14 125 lb 4 oz (56.813 kg)  12/12/13 123 lb 4 oz (55.906 kg)    Physical Exam  Constitutional: She appears well-developed and well-nourished. No distress.  HENT:  Mouth/Throat: Oropharynx is clear and moist. No oropharyngeal exudate.  Cardiovascular: Normal rate, regular rhythm, normal heart sounds and intact distal pulses.   No murmur heard. Pulmonary/Chest: Effort normal and breath sounds normal. No respiratory distress. She has no wheezes. She has no rales.  coarse  Musculoskeletal: She exhibits no edema.  Skin: Skin is warm and dry. No rash noted.  Nursing note and vitals reviewed.  Results for orders placed or performed in visit on 10/16/14  Lipid panel  Result Value Ref Range   Cholesterol 115 0 - 200 mg/dL   Triglycerides 94.0 0.0 - 149.0 mg/dL   HDL 32.00 (L) >39.00 mg/dL   VLDL 18.8 0.0 - 40.0 mg/dL   LDL Cholesterol 64 0 - 99 mg/dL   Total CHOL/HDL Ratio 4    NonHDL 30.86   Basic metabolic panel  Result Value Ref Range   Sodium 141 135 - 145 mEq/L   Potassium 5.3 (H) 3.5 - 5.1 mEq/L   Chloride 104 96 - 112 mEq/L   CO2 30 19 - 32 mEq/L   Glucose, Bld 122 (  H) 70 - 99 mg/dL   BUN 24 (H) 6 - 23 mg/dL   Creatinine, Ser 1.06 0.40 - 1.20 mg/dL   Calcium 10.7 (H) 8.4 - 10.5 mg/dL   GFR 53.14 (L) >60.00 mL/min  Hemoglobin A1c  Result Value Ref Range   Hgb A1c MFr Bld 6.2 4.6 - 6.5 %  Vit D  25 hydroxy (rtn osteoporosis monitoring)  Result Value Ref Range   VITD 34.58 30.00 - 100.00 ng/mL      Assessment & Plan:   Problem List Items Addressed This Visit    HTN (hypertension) - Primary    bp remaining stable on once daily enalapril - continue. Recheck K today.      Relevant Orders   Basic metabolic panel   Status post thoracic aortic aneurysm repair    Consider updated echo next visit.      Smoker     Continue to encourage cessation. Declines NRT, chantix, wellbutrin. Feels she needs to quit on her own. precontemplative currently.      Osteoporosis    S/p 11 mo fosamax treatment.  Discussed calcium/vit D supplement. Difficult situation in h/o CAD and severe osteoporosis. Will recommend restart cal/vit D 600/400 one every other day, encourage continued weight bearing exercises.      Relevant Medications   Calcium Carb-Cholecalciferol (CALCIUM-VITAMIN D) 600-400 MG-UNIT TABS       Follow up plan: Return in about 6 months (around 04/25/2015), or as needed, for medicare wellness visit.

## 2014-10-23 NOTE — Patient Instructions (Addendum)
Continue just one enalapril daily. Increase water intake to make sure you're staying well hydrated. Increase aerobic exercise like walking outdoors - slowly. labwork today. Keep working on smoking cessation.  Restart calcium/vitamin D 600mg /400 units every other day.

## 2014-10-23 NOTE — Progress Notes (Signed)
Pre visit review using our clinic review tool, if applicable. No additional management support is needed unless otherwise documented below in the visit note. 

## 2014-10-23 NOTE — Assessment & Plan Note (Signed)
S/p 11 mo fosamax treatment.  Discussed calcium/vit D supplement. Difficult situation in h/o CAD and severe osteoporosis. Will recommend restart cal/vit D 600/400 one every other day, encourage continued weight bearing exercises.

## 2014-10-23 NOTE — Assessment & Plan Note (Addendum)
Continue to encourage cessation. Declines NRT, chantix, wellbutrin. Feels she needs to quit on her own. precontemplative currently.

## 2014-10-23 NOTE — Assessment & Plan Note (Signed)
Consider updated echo next visit.

## 2014-10-24 ENCOUNTER — Other Ambulatory Visit: Payer: Self-pay | Admitting: Family Medicine

## 2014-10-26 ENCOUNTER — Encounter: Payer: Self-pay | Admitting: *Deleted

## 2014-11-02 ENCOUNTER — Telehealth: Payer: Self-pay | Admitting: *Deleted

## 2014-11-02 MED ORDER — ENALAPRIL MALEATE 10 MG PO TABS: 10.0000 mg | ORAL_TABLET | Freq: Every day | ORAL | Status: DC

## 2014-11-02 NOTE — Telephone Encounter (Signed)
Message left advising patient to replace med at home with new dose of med at the pharmacy. Also advised to continue all of her other meds and to call with an update in 2-3 weeks.

## 2014-11-02 NOTE — Telephone Encounter (Signed)
Patient called to let you know that her BP yesterday was 109/57 and today 101/54. Patient wants to know if she should do anything about these low BP readings? Patient stated that she feels fine and not having any symptoms. Please advise.

## 2014-11-02 NOTE — Telephone Encounter (Signed)
Let's cut enalapril down to 10mg  daily - new dose sent to pharmacy. Continue hctz 12.5mg  daily and metoprolol 50mg  bid. Update [place] in 2-3 weeks with BP's.

## 2014-11-03 NOTE — Telephone Encounter (Signed)
PLEASE NOTE: All timestamps contained within this report are represented as [place] Standard Time. CONFIDENTIALTY NOTICE: This fax transmission is intended only for the addressee. It contains information that is legally privileged, confidential or otherwise protected from use or disclosure. If you are not the intended recipient, you are strictly prohibited from reviewing, disclosing, copying using or disseminating any of this information or taking any action in reliance on or regarding this information. If you have received this fax in error, please notify us immediately by telephone so that we can arrange for its return to [place]. Phone: [phone], Toll-Free: [phone], Fax: [phone] Page: 1 of 1 Call Id: [id number] [place] Patient Name: Maria Simon Gender: Female DOB: 1934-02-03 Age: 81 Y 28 D Return Phone Number: Address: City/State/Zip: [place] - Client Client Site [place] Physician [place], [place] Type Call Caller Name Bobbye Riggs Phone Number na Relationship To Patient Self Is this call to report lab results? No Call Type General Information Initial Comment Caller states missed a call from the dr General Information Type Message Only Nurse Assessment Guidelines Guideline Title Affirmed Question Affirmed Notes Nurse Date/Time (Eastern Time) Disp. Time Eilene Ghazi Time) Disposition Final User 11/02/2014 5:30:05 PM General Information Provided Yes Ronny Flurry After Care Instructions Given Call Event Type User Date / Time Description

## 2014-11-03 NOTE — Telephone Encounter (Signed)
Spoke with patient and notified her of Dr. Synthia Innocent suggestions.

## 2014-11-27 ENCOUNTER — Ambulatory Visit (INDEPENDENT_AMBULATORY_CARE_PROVIDER_SITE_OTHER): Payer: Medicare Other

## 2014-11-27 DIAGNOSIS — Z23 Encounter for immunization: Secondary | ICD-10-CM

## 2015-01-09 ENCOUNTER — Other Ambulatory Visit: Payer: Self-pay | Admitting: Family Medicine

## 2015-01-11 ENCOUNTER — Other Ambulatory Visit: Payer: Self-pay | Admitting: Family Medicine

## 2015-01-11 MED ORDER — ENALAPRIL MALEATE 10 MG PO TABS: 10.0000 mg | ORAL_TABLET | Freq: Every day | ORAL | Status: DC

## 2015-02-21 ENCOUNTER — Other Ambulatory Visit: Payer: Self-pay | Admitting: Family Medicine

## 2015-03-04 ENCOUNTER — Encounter: Payer: Self-pay | Admitting: *Deleted

## 2015-03-22 ENCOUNTER — Other Ambulatory Visit: Payer: Self-pay | Admitting: Family Medicine

## 2015-04-23 ENCOUNTER — Other Ambulatory Visit (INDEPENDENT_AMBULATORY_CARE_PROVIDER_SITE_OTHER): Payer: Medicare Other

## 2015-04-23 ENCOUNTER — Ambulatory Visit: Payer: Medicare Other

## 2015-04-23 ENCOUNTER — Other Ambulatory Visit: Payer: Self-pay | Admitting: Family Medicine

## 2015-04-23 DIAGNOSIS — R7303 Prediabetes: Secondary | ICD-10-CM

## 2015-04-23 DIAGNOSIS — E785 Hyperlipidemia, unspecified: Secondary | ICD-10-CM

## 2015-04-23 DIAGNOSIS — D696 Thrombocytopenia, unspecified: Secondary | ICD-10-CM | POA: Diagnosis not present

## 2015-04-23 DIAGNOSIS — I10 Essential (primary) hypertension: Secondary | ICD-10-CM

## 2015-04-23 LAB — CBC WITH DIFFERENTIAL/PLATELET
BASOS ABS: 0 10*3/uL (ref 0.0–0.1)
Basophils Relative: 0.4 % (ref 0.0–3.0)
EOS ABS: 0 10*3/uL (ref 0.0–0.7)
Eosinophils Relative: 0.6 % (ref 0.0–5.0)
HCT: 42.1 % (ref 36.0–46.0)
Hemoglobin: 13.8 g/dL (ref 12.0–15.0)
LYMPHS ABS: 1.8 10*3/uL (ref 0.7–4.0)
Lymphocytes Relative: 23.8 % (ref 12.0–46.0)
MCHC: 32.9 g/dL (ref 30.0–36.0)
MCV: 82.8 fl (ref 78.0–100.0)
Monocytes Absolute: 0.6 10*3/uL (ref 0.1–1.0)
Monocytes Relative: 8.1 % (ref 3.0–12.0)
NEUTROS ABS: 5.2 10*3/uL (ref 1.4–7.7)
NEUTROS PCT: 67.1 % (ref 43.0–77.0)
PLATELETS: 164 10*3/uL (ref 150.0–400.0)
RBC: 5.09 Mil/uL (ref 3.87–5.11)
RDW: 15.8 % — ABNORMAL HIGH (ref 11.5–15.5)
WBC: 7.7 10*3/uL (ref 4.0–10.5)

## 2015-04-23 LAB — LIPID PANEL
CHOL/HDL RATIO: 3
CHOLESTEROL: 110 mg/dL (ref 0–200)
HDL: 34.4 mg/dL — ABNORMAL LOW (ref >39.00)
LDL CALC: 61 mg/dL (ref 0–99)
NonHDL: 75.97
Triglycerides: 75 mg/dL (ref 0.0–149.0)
VLDL: 15 mg/dL (ref 0.0–40.0)

## 2015-04-23 LAB — BASIC METABOLIC PANEL
BUN: 22 mg/dL (ref 6–23)
CALCIUM: 10.6 mg/dL — AB (ref 8.4–10.5)
CO2: 31 meq/L (ref 19–32)
CREATININE: 0.95 mg/dL (ref 0.40–1.20)
Chloride: 104 mEq/L (ref 96–112)
GFR: 60.23 mL/min (ref >60.00)
GLUCOSE: 125 mg/dL — AB (ref 70–99)
Potassium: 5 mEq/L (ref 3.5–5.1)
SODIUM: 141 meq/L (ref 135–145)

## 2015-04-23 LAB — HEMOGLOBIN A1C: HEMOGLOBIN A1C: 6.4 % (ref 4.6–6.5)

## 2015-04-30 ENCOUNTER — Ambulatory Visit (INDEPENDENT_AMBULATORY_CARE_PROVIDER_SITE_OTHER): Payer: Medicare Other

## 2015-04-30 VITALS — BP 92/72 | HR 83 | Temp 98.5°F | Ht 61.0 in | Wt 122.5 lb

## 2015-04-30 DIAGNOSIS — Z0000 Encounter for general adult medical examination without abnormal findings: Secondary | ICD-10-CM

## 2015-04-30 NOTE — Progress Notes (Signed)
I reviewed health advisor's note, was available for consultation, and agree with documentation and plan.  

## 2015-04-30 NOTE — Progress Notes (Signed)
Pre visit review using our clinic review tool, if applicable. No additional management support is needed unless otherwise documented below in the visit note. 

## 2015-04-30 NOTE — Patient Instructions (Signed)
Maria Simon , Thank you for taking time to come for your Medicare Wellness Visit. I appreciate your ongoing commitment to your health goals. Please review the following plan we discussed and let me know if I can assist you in the future.   These are the goals we discussed: Goals    . Increase physical activity     Starting Spring 2017 when weather permits, I will increase physical activity by walking 3 days per week for at least 20 min.         This is a list of the screening recommended for you and due dates:  Health Maintenance  Topic Date Due  . Shingles Vaccine  04/29/2016*  . Tetanus Vaccine  04/09/2025*  . Flu Shot  10/05/2015  . DTaP/Tdap/Td vaccine  Completed  . DEXA scan (bone density measurement)  Completed  . Pneumonia vaccines  Completed  *Topic was postponed. The date shown is not the original due date.   Preventive Care for Adults  A healthy lifestyle and preventive care can promote health and wellness. Preventive health guidelines for adults include the following key practices.  . A routine yearly physical is a good way to check with your health care provider about your health and preventive screening. It is a chance to share any concerns and updates on your health and to receive a thorough exam.  . Visit your dentist for a routine exam and preventive care every 6 months. Brush your teeth twice a day and floss once a day. Good oral hygiene prevents tooth decay and gum disease.  . The frequency of eye exams is based on your age, health, family medical history, use  of contact lenses, and other factors. Follow your health care provider's ecommendations for frequency of eye exams.  . Eat a healthy diet. Foods like vegetables, fruits, whole grains, low-fat dairy products, and lean protein foods contain the nutrients you need without too many calories. Decrease your intake of foods high in solid fats, added sugars, and salt. Eat the right amount of calories for you. Get  information about a proper diet from your health care provider, if necessary.  . Regular physical exercise is one of the most important things you can do for your health. Most adults should get at least 150 minutes of moderate-intensity exercise (any activity that increases your heart rate and causes you to sweat) each week. In addition, most adults need muscle-strengthening exercises on 2 or more days a week.  . Maintain a healthy weight. The body mass index (BMI) is a screening tool to identify possible weight problems. It provides an estimate of body fat based on height and weight. Your health care provider can find your BMI and can help you achieve or maintain a healthy weight.   For adults 20 years and older: ? A BMI below 18.5 is considered underweight. ? A BMI of 18.5 to 24.9 is normal. ? A BMI of 25 to 29.9 is considered overweight. ? A BMI of 30 and above is considered obese.   . Maintain normal blood lipids and cholesterol levels by exercising and minimizing your intake of saturated fat. Eat a balanced diet with plenty of fruit and vegetables. Blood tests for lipids and cholesterol should begin at age 3 and be repeated every 5 years. If your lipid or cholesterol levels are high, you are over 50, or you are at high risk for heart disease, you may need your cholesterol levels checked more frequently. Ongoing high lipid and  cholesterol levels should be treated with medicines if diet and exercise are not working.  . If you smoke, find out from your health care provider how to quit. If you do not use tobacco, please do not start.  . If you choose to drink alcohol, please do not consume more than 2 drinks per day. One drink is considered to be 12 ounces (355 mL) of beer, 5 ounces (148 mL) of wine, or 1.5 ounces (44 mL) of liquor.  . If you are 65-53 years old, ask your health care provider if you should take aspirin to prevent strokes.  . Osteoporosis is a disease in which the bones lose  minerals and strength with aging. This can result in serious bone fractures or breaks. The risk of osteoporosis can be identified using a bone density scan. Women ages 33 years and over and women at risk for fractures or osteoporosis should discuss screening with their health care providers. Ask your health care provider whether you should take a calcium supplement or vitamin D to reduce the rate of osteoporosis.  . For Women Only: Menopause can be associated with physical symptoms and risks. Hormone replacement therapy is available to decrease symptoms and risks. You should talk to your health care provider about whether hormone replacement therapy is right for you.  . Use sunscreen. Apply sunscreen liberally and repeatedly throughout the day. You should seek shade when your shadow is shorter than you. Protect yourself by wearing long sleeves, pants, a wide-brimmed hat, and sunglasses year round, whenever you are outdoors.  . Once a month, do a whole body skin exam, using a mirror to look at the skin on your back. Tell your health care provider of new moles, moles that have irregular borders, moles that are larger than a pencil eraser, or moles that have changed in shape or color.

## 2015-04-30 NOTE — Progress Notes (Signed)
Subjective:   Maria Simon is a 82 y.o. female who presents for Medicare Annual (Subsequent) preventive examination.  Review of Systems:  No ROS  Cardiac Risk Factors include: advanced age (>67men, >23 women);dyslipidemia;hypertension;smoking/ tobacco exposure     Objective:     Vitals: BP 92/72 mmHg  Pulse 83  Temp(Src) 98.5 F (36.9 C) (Oral)  Ht 5\' 1"  (1.549 m)  Wt 122 lb 8 oz (55.566 kg)  BMI 23.16 kg/m2  SpO2 94%  Tobacco History  Smoking status  . Current Every Day Smoker -- 0.50 packs/day for 61 years  . Types: Cigarettes  Smokeless tobacco  . Never Used    Comment: 1/2 PPD usually, but sometimes more     Ready to quit: No Counseling given: No   Past Medical History  Diagnosis Date  . Systolic and diastolic CHF, chronic ([place])     last ultrasound prior to 2011, EF 10-15%, dilated likely nonischemic CM with CHF, will need to [place] with cards  . HLD (hyperlipidemia)   . HTN (hypertension)   . Status post thoracic aortic aneurysm repair 2006    graft in place, refused CT f/u in past.  . History of chicken pox   . Smoker     ? COPD  . History of TIAs 1998  . CAD (coronary artery disease)     class 1-2 angina  . PAD (peripheral artery disease) ([place])   . Osteoporosis     h/o compression fx T11, L5 by CT scan, Dexa 11/2010 -3.9 spine, -3.5 femur  . Prediabetes 05/26/2011   Past Surgical History  Procedure Laterality Date  . Thoracic aortic aneurysm repair  01-11-05    first open then endovascular graft; GORE TAG ref: [id number]; Lot: [id number]  . Appendectomy  1950  . Total abdominal hysterectomy  1973    ovaries out.  reason: bleeding  . Vein ligation and stripping    . Aorta - superior mesenteric artery bypass graft    . Ct scan  07/2005    patent TAA graft, small endoleak, enlarging aneurysm.  patent SMA/celiac artery graft.  stable dissection of L iliac arteries, emphysema, , osteopenia, compression fx T11, L5, refused further CT scans  . US  echocardiography  [id number]    systolic and diastolic dysfunction, EF 123XX123, dilated L vent, CAD, degenerative mitral valve with regurg, aortic sclerosis, R vent dysfunction, pulm HTN  . Dexa  11/2010    -3.9 spine, -3.5 femur   Family History  Problem Relation Age of Onset  . Cancer Neg Hx   . Diabetes Neg Hx   . Stroke Neg Hx   . Hypertension Neg Hx   . Mental illness Mother     deceased suicide   History  Sexual Activity  . Sexual Activity: No    Outpatient Encounter Prescriptions as of 04/30/2015  Medication Sig  . aspirin 81 MG tablet Take 81 mg by mouth daily.   Marland Kitchen atorvastatin (LIPITOR) 80 MG tablet TAKE 1 TABLET BY MOUTH EVERY DAY  . Calcium Carb-Cholecalciferol (CALCIUM-VITAMIN D) 600-400 MG-UNIT TABS Take 1 tablet by mouth every other day.   . enalapril (VASOTEC) 10 MG tablet TAKE 1 TABLET BY MOUTH EVERY DAY  . hydrochlorothiazide (MICROZIDE) 12.5 MG capsule TAKE 1 CAPSULE BY MOUTH EVERY DAY  . metoprolol (LOPRESSOR) 50 MG tablet TAKE 1 TABLET BY MOUTH TWICE DAILY  . Multiple Vitamin (MULTIVITAMIN) tablet Take 1 tablet by mouth daily. Centrum Silver   No facility-administered encounter medications on file  as of 04/30/2015.    Activities of Daily Living In your present state of health, do you have any difficulty performing the following activities: 04/30/2015  Hearing? N  Vision? N  Difficulty concentrating or making decisions? N  Walking or climbing stairs? N  Dressing or bathing? N  Doing errands, shopping? N  Preparing Food and eating ? N  Using the Toilet? N  In the past six months, have you accidently leaked urine? N  Do you have problems with loss of bowel control? N  Managing your Medications? N  Managing your Finances? N  Housekeeping or managing your Housekeeping? N    Patient Care Team: Ria Bush, MD as PCP - General [facility])   Provider list updated Assessment:    Exercise Activities and Dietary recommendations Current Exercise  Habits:: The patient does not participate in regular exercise at present  Goals    . Increase physical activity     Starting Spring 2017 when weather permits, I will increase physical activity by walking 3 days per week for at least 20 min.        Fall Risk Fall Risk  04/30/2015 01/23/2014 05/31/2012  Falls in the past year? No No No   Depression Screen PHQ 2/9 Scores 04/30/2015 01/23/2014 05/31/2012 05/26/2011  PHQ - 2 Score 0 0 0 1     Cognitive Testing Passed mini-cog screen.   Immunization History  Administered Date(s) Administered  . Influenza Split 11/25/2010, 12/01/2011  . Influenza,inj,Quad PF,36+ Mos 12/13/2012, 12/12/2013, 11/27/2014  . Pneumococcal Conjugate-13 12/12/2013  . Pneumococcal Polysaccharide-23 02/11/2006   Screening Tests Health Maintenance  Topic Date Due  . ZOSTAVAX  04/29/2016 (Originally 10/05/1995)  . TETANUS/TDAP  04/09/2025 (Originally 10/05/1954)  . INFLUENZA VACCINE  10/05/2015  . DTaP/Tdap/Td  Completed  . DEXA SCAN  Completed  . PNA vac Low Risk Adult  Completed      Plan:    During the course of the visit the patient was educated and counseled about the following appropriate screening and preventive services:   Vaccines to include tetanus, Zostavax - declined due to cost  Vision screening - pt stated she would schedule an appointment  Hearing screening with audiologist - declined  Advanced directives - requested a copy fo rmedical record  General preventive health recommendations were provided.   Patient Instructions (the written plan) was given to the patient.   See attached scanned questionnaire for further information.  Lindell Noe, LPN  [id number]

## 2015-11-26 ENCOUNTER — Encounter: Payer: Self-pay | Admitting: Family Medicine

## 2015-11-26 ENCOUNTER — Ambulatory Visit (INDEPENDENT_AMBULATORY_CARE_PROVIDER_SITE_OTHER): Payer: Medicare Other | Admitting: Family Medicine

## 2015-11-26 VITALS — BP 124/84 | HR 84 | Temp 98.4°F | Wt 121.0 lb

## 2015-11-26 DIAGNOSIS — Z23 Encounter for immunization: Secondary | ICD-10-CM

## 2015-11-26 DIAGNOSIS — R7303 Prediabetes: Secondary | ICD-10-CM | POA: Diagnosis not present

## 2015-11-26 DIAGNOSIS — Z9889 Other specified postprocedural states: Secondary | ICD-10-CM

## 2015-11-26 DIAGNOSIS — M810 Age-related osteoporosis without current pathological fracture: Secondary | ICD-10-CM | POA: Diagnosis not present

## 2015-11-26 DIAGNOSIS — I2510 Atherosclerotic heart disease of native coronary artery without angina pectoris: Secondary | ICD-10-CM | POA: Diagnosis not present

## 2015-11-26 DIAGNOSIS — Z8679 Personal history of other diseases of the circulatory system: Secondary | ICD-10-CM

## 2015-11-26 DIAGNOSIS — I10 Essential (primary) hypertension: Secondary | ICD-10-CM

## 2015-11-26 DIAGNOSIS — F17200 Nicotine dependence, unspecified, uncomplicated: Secondary | ICD-10-CM

## 2015-11-26 DIAGNOSIS — Z720 Tobacco use: Secondary | ICD-10-CM

## 2015-11-26 LAB — BASIC METABOLIC PANEL
BUN: 17 mg/dL (ref 7–25)
CALCIUM: 10 mg/dL (ref 8.6–10.4)
CHLORIDE: 102 mmol/L (ref 98–110)
CO2: 29 mmol/L (ref 20–31)
CREATININE: 0.9 mg/dL — AB (ref 0.60–0.88)
Glucose, Bld: 131 mg/dL — ABNORMAL HIGH (ref 65–99)
Potassium: 4 mmol/L (ref 3.5–5.3)
Sodium: 139 mmol/L (ref 135–146)

## 2015-11-26 NOTE — Assessment & Plan Note (Signed)
Continue to encourage cessation. Precontemplative.  ?

## 2015-11-26 NOTE — Progress Notes (Signed)
BP 124/84   Pulse 84   Temp 98.4 F (36.9 C) (Oral)   Wt 121 lb (54.9 kg)   BMI 22.86 kg/m    CC: f/u visit Subjective:    Patient ID: Maria Simon, female    DOB: 07-10-1933, 82 y.o.   MRN: [id number]  HPI: Maria Simon is a 82 y.o. female presenting on 11/26/2015 for Follow-up   Saw Katha Cabal 04/2014 for medicare wellness visit, note reviewed.   Severe osteoporosis with T -3.9 spine. Fosamax caused groin pain (took for 11 months). Drinks 3 cups milk/day as well as calcium fortified orange juice. Vit D was stopped 2/2 elevated levels, on recheck 56 (06/2013). No regular weight bearing exercise.   HTN - Compliant with current antihypertensive regimen of enalapril 20mg  and hctz 12.5mg  daily. Does not check blood pressures at home. No low blood pressure readings or symptoms of dizziness/syncope.  Denies HA, vision changes, CP/tightness, SOB, leg swelling.   Continued smoking 1/2 ppd - declines further medication for cessation attempts  Relevant past medical, surgical, family and social history reviewed and updated as indicated. Interim medical history since our last visit reviewed. Allergies and medications reviewed and updated. Current Outpatient Prescriptions on File Prior to Visit  Medication Sig  . aspirin 81 MG tablet Take 81 mg by mouth daily.   Marland Kitchen atorvastatin (LIPITOR) 80 MG tablet TAKE 1 TABLET BY MOUTH EVERY DAY  . Calcium Carb-Cholecalciferol (CALCIUM-VITAMIN D) 600-400 MG-UNIT TABS Take 1 tablet by mouth every other day.   . enalapril (VASOTEC) 10 MG tablet TAKE 1 TABLET BY MOUTH EVERY DAY  . hydrochlorothiazide (MICROZIDE) 12.5 MG capsule TAKE 1 CAPSULE BY MOUTH EVERY DAY  . metoprolol (LOPRESSOR) 50 MG tablet TAKE 1 TABLET BY MOUTH TWICE DAILY  . Multiple Vitamin (MULTIVITAMIN) tablet Take 1 tablet by mouth daily. Centrum Silver   No current facility-administered medications on file prior to visit.     Review of Systems Per HPI unless specifically indicated in  ROS section     Objective:    BP 124/84   Pulse 84   Temp 98.4 F (36.9 C) (Oral)   Wt 121 lb (54.9 kg)   BMI 22.86 kg/m   Wt Readings from Last 3 Encounters:  11/26/15 121 lb (54.9 kg)  04/30/15 122 lb 8 oz (55.6 kg)  10/23/14 122 lb 4 oz (55.5 kg)    Physical Exam  Constitutional: She appears well-developed and well-nourished. No distress.  HENT:  Mouth/Throat: Oropharynx is clear and moist. No oropharyngeal exudate.  Cardiovascular: Normal rate, regular rhythm, normal heart sounds and intact distal pulses.   No murmur heard. Pulmonary/Chest: Effort normal and breath sounds normal. No respiratory distress. She has no wheezes. She has no rales.  Musculoskeletal: She exhibits no edema.  Skin: Skin is warm and dry. No rash noted.  Psychiatric: She has a normal mood and affect.  Nursing note and vitals reviewed.  Results for orders placed or performed in visit on 04/23/15  Lipid panel  Result Value Ref Range   Cholesterol 110 0 - 200 mg/dL   Triglycerides 75.0 0.0 - 149.0 mg/dL   HDL 34.40 (L) >39.00 mg/dL   VLDL 15.0 0.0 - 40.0 mg/dL   LDL Cholesterol 61 0 - 99 mg/dL   Total CHOL/HDL Ratio 3    NonHDL 75.97   Hemoglobin A1c  Result Value Ref Range   Hgb A1c MFr Bld 6.4 4.6 - 6.5 %  Basic metabolic panel  Result Value Ref Range  Sodium 141 135 - 145 mEq/L   Potassium 5.0 3.5 - 5.1 mEq/L   Chloride 104 96 - 112 mEq/L   CO2 31 19 - 32 mEq/L   Glucose, Bld 125 (H) 70 - 99 mg/dL   BUN 22 6 - 23 mg/dL   Creatinine, Ser 0.95 0.40 - 1.20 mg/dL   Calcium 10.6 (H) 8.4 - 10.5 mg/dL   GFR 60.23 >60.00 mL/min  CBC with Differential/Platelet  Result Value Ref Range   WBC 7.7 4.0 - 10.5 K/uL   RBC 5.09 3.87 - 5.11 Mil/uL   Hemoglobin 13.8 12.0 - 15.0 g/dL   HCT 42.1 36.0 - 46.0 %   MCV 82.8 78.0 - 100.0 fl   MCHC 32.9 30.0 - 36.0 g/dL   RDW 15.8 (H) 11.5 - 15.5 %   Platelets 164.0 150.0 - 400.0 K/uL   Neutrophils Relative % 67.1 43.0 - 77.0 %   Lymphocytes Relative  23.8 12.0 - 46.0 %   Monocytes Relative 8.1 3.0 - 12.0 %   Eosinophils Relative 0.6 0.0 - 5.0 %   Basophils Relative 0.4 0.0 - 3.0 %   Neutro Abs 5.2 1.4 - 7.7 K/uL   Lymphs Abs 1.8 0.7 - 4.0 K/uL   Monocytes Absolute 0.6 0.1 - 1.0 K/uL   Eosinophils Absolute 0.0 0.0 - 0.7 K/uL   Basophils Absolute 0.0 0.0 - 0.1 K/uL      Assessment & Plan:   Problem List Items Addressed This Visit    CAD (coronary artery disease)    Asxs.       HTN (hypertension)    Chronic, stable. Continue ACEI and HCTZ.       Relevant Orders   Basic metabolic panel   Osteoporosis - Primary    Completed 11 mo fosamax then discontinued due to groin pain. Discussed prolia.  Reviewed cal/vit D recommended intake, continue cal/vit D supplement once daily.  Update vit D levels (h/o high level). Discussed importance of regular weight bearing exercise. Last dexa 11/2010. Consider updated if this would help pt decide on prolia.      Relevant Orders   VITAMIN D 25 Hydroxy (Vit-D Deficiency, Fractures)   Prediabetes    Update A1c.      Relevant Orders   Hemoglobin A1c   Smoker    Continue to encourage cessation. Precontemplative.       Status post thoracic aortic aneurysm repair    Consider updated echo next visit.        Other Visit Diagnoses    Need for influenza vaccination       Relevant Orders   Flu Vaccine QUAD 36+ mos PF IM (Fluarix & Fluzone Quad PF) (Completed)   Hypercalcemia       Relevant Orders   Parathyroid hormone, intact (no Ca)   VITAMIN D 25 Hydroxy (Vit-D Deficiency, Fractures)       Follow up plan: Return in about 6 months (around 05/25/2016) for annual exam, prior fasting for blood work.  Ria Bush, MD

## 2015-11-26 NOTE — Progress Notes (Signed)
Pre visit review using our clinic review tool, if applicable. No additional management support is needed unless otherwise documented below in the visit note. 

## 2015-11-26 NOTE — Assessment & Plan Note (Addendum)
Completed 11 mo fosamax then discontinued due to groin pain. Discussed prolia.  Reviewed cal/vit D recommended intake, continue cal/vit D supplement once daily.  Update vit D levels (h/o high level). Discussed importance of regular weight bearing exercise. Last dexa 11/2010. Consider updated if this would help pt decide on prolia.

## 2015-11-26 NOTE — Patient Instructions (Addendum)
Flu shot today. Labs today.  Consider every 6 month prolia injection for bone strength.  I encourage establishing a walking routine for bone strength and for aerobic exercise benefits. Work on continued quitting smoking and let us know if you'd like assistance.  Osteoporosis Osteoporosis is the thinning and loss of density in the bones. Osteoporosis makes the bones more brittle, fragile, and likely to break (fracture). Over time, osteoporosis can cause the bones to become so weak that they fracture after a simple fall. The bones most likely to fracture are the bones in the hip, wrist, and spine. CAUSES  The exact cause is not known. RISK FACTORS Anyone can develop osteoporosis. You may be at greater risk if you have a family history of the condition or have poor nutrition. You may also have a higher risk if you are:   Female.   80 years old or older.  A smoker.  Not physically active.   White or Asian.  Slender. SIGNS AND SYMPTOMS  A fracture might be the first sign of the disease, especially if it results from a fall or injury that would not usually cause a bone to break. Other signs and symptoms include:   Low back and neck pain.  Stooped posture.  Height loss. DIAGNOSIS  To make a diagnosis, your health care provider may:  Take a medical history.  Perform a physical exam.  Order tests, such as:  A bone mineral density test.  A dual-energy X-ray absorptiometry test. TREATMENT  The goal of osteoporosis treatment is to strengthen your bones to reduce your risk of a fracture. Treatment may involve:  Making lifestyle changes, such as:  Eating a diet rich in calcium.  Doing weight-bearing and muscle-strengthening exercises.  Stopping tobacco use.  Limiting alcohol intake.  Taking medicine to slow the process of bone loss or to increase bone density.  Monitoring your levels of calcium and vitamin D. HOME CARE INSTRUCTIONS  Include calcium and vitamin D in  your diet. Calcium is important for bone health, and vitamin D helps the body absorb calcium.  Perform weight-bearing and muscle-strengthening exercises as directed by your health care provider.  Do not use any tobacco products, including cigarettes, chewing tobacco, and electronic cigarettes. If you need help quitting, ask your health care provider.  Limit your alcohol intake.  Take medicines only as directed by your health care provider.  Keep all follow-up visits as directed by your health care provider. This is important.  Take precautions at home to lower your risk of falling, such as:  Keeping rooms well lit and clutter free.  Installing safety rails on stairs.  Using rubber mats in the bathroom and other areas that are often wet or slippery. SEEK IMMEDIATE MEDICAL CARE IF:  You fall or injure yourself.    This information is not intended to replace advice given to you by your health care provider. Make sure you discuss any questions you have with your health care provider.   Document Released: 11/30/2004 Document Revised: 03/13/2014 Document Reviewed: 07/31/2013 Elsevier Interactive Patient Education [employer].

## 2015-11-26 NOTE — Assessment & Plan Note (Signed)
Chronic, stable. Continue ACEI and HCTZ.

## 2015-11-26 NOTE — Assessment & Plan Note (Signed)
Consider updated echo next visit. 

## 2015-11-26 NOTE — Assessment & Plan Note (Signed)
Asxs.  ?

## 2015-11-26 NOTE — Assessment & Plan Note (Signed)
Update A1c ?

## 2015-11-27 LAB — VITAMIN D 25 HYDROXY (VIT D DEFICIENCY, FRACTURES): Vit D, 25-Hydroxy: 51 ng/mL (ref 30–100)

## 2015-11-27 LAB — HEMOGLOBIN A1C
HEMOGLOBIN A1C: 5.8 % — AB (ref <5.7)
Mean Plasma Glucose: 120 mg/dL

## 2015-11-29 LAB — PARATHYROID HORMONE, INTACT (NO CA): PTH: 67 pg/mL — ABNORMAL HIGH (ref 14–64)

## 2015-11-30 ENCOUNTER — Encounter: Payer: Self-pay | Admitting: *Deleted

## 2016-01-08 ENCOUNTER — Other Ambulatory Visit: Payer: Self-pay | Admitting: Family Medicine

## 2016-02-24 ENCOUNTER — Other Ambulatory Visit: Payer: Self-pay | Admitting: Family Medicine

## 2016-02-24 NOTE — Telephone Encounter (Signed)
Pt request status of atorvastatin and HCTZ refills; pt did not want to schedule 6  mth f/u today; Dr Darnell Level said OK to fill for 1 yr. Done and pt voiced understanding.

## 2016-03-19 ENCOUNTER — Other Ambulatory Visit: Payer: Self-pay | Admitting: Family Medicine

## 2016-06-09 ENCOUNTER — Other Ambulatory Visit: Payer: Self-pay | Admitting: Family Medicine

## 2016-06-09 ENCOUNTER — Ambulatory Visit (INDEPENDENT_AMBULATORY_CARE_PROVIDER_SITE_OTHER): Payer: Medicare Other

## 2016-06-09 VITALS — BP 112/80 | HR 87 | Temp 97.6°F | Ht 60.5 in | Wt 118.5 lb

## 2016-06-09 DIAGNOSIS — M810 Age-related osteoporosis without current pathological fracture: Secondary | ICD-10-CM | POA: Diagnosis not present

## 2016-06-09 DIAGNOSIS — E7800 Pure hypercholesterolemia, unspecified: Secondary | ICD-10-CM | POA: Diagnosis not present

## 2016-06-09 DIAGNOSIS — Z0000 Encounter for general adult medical examination without abnormal findings: Secondary | ICD-10-CM

## 2016-06-09 DIAGNOSIS — R7303 Prediabetes: Secondary | ICD-10-CM | POA: Diagnosis not present

## 2016-06-09 LAB — LIPID PANEL
CHOLESTEROL: 106 mg/dL (ref 0–200)
HDL: 32.2 mg/dL — ABNORMAL LOW (ref >39.00)
LDL CALC: 54 mg/dL (ref 0–99)
NonHDL: 74.11
TRIGLYCERIDES: 102 mg/dL (ref 0.0–149.0)
Total CHOL/HDL Ratio: 3
VLDL: 20.4 mg/dL (ref 0.0–40.0)

## 2016-06-09 LAB — BASIC METABOLIC PANEL
BUN: 22 mg/dL (ref 6–23)
CHLORIDE: 102 meq/L (ref 96–112)
CO2: 32 meq/L (ref 19–32)
Calcium: 10.8 mg/dL — ABNORMAL HIGH (ref 8.4–10.5)
Creatinine, Ser: 0.93 mg/dL (ref 0.40–1.20)
GFR: 61.55 mL/min (ref >60.00)
GLUCOSE: 130 mg/dL — AB (ref 70–99)
POTASSIUM: 5.1 meq/L (ref 3.5–5.1)
SODIUM: 140 meq/L (ref 135–145)

## 2016-06-09 LAB — HEMOGLOBIN A1C: Hgb A1c MFr Bld: 6.3 % (ref 4.6–6.5)

## 2016-06-09 LAB — VITAMIN D 25 HYDROXY (VIT D DEFICIENCY, FRACTURES): VITD: 38.56 ng/mL (ref 30.00–100.00)

## 2016-06-09 NOTE — Patient Instructions (Signed)
Ms. Yassin , Thank you for taking time to come for your Medicare Wellness Visit. I appreciate your ongoing commitment to your health goals. Please review the following plan we discussed and let me know if I can assist you in the future.   These are the goals we discussed: Goals    . Increase physical activity          Starting Spring 2018 when weather permits, I will increase physical activity by walking 3 days per week for at least 20 min.         This is a list of the screening recommended for you and due dates:  Health Maintenance  Topic Date Due  . DTaP/Tdap/Td vaccine (1 - Tdap) 04/09/2025*  . Tetanus Vaccine  04/09/2025*  . Flu Shot  10/04/2016  . DEXA scan (bone density measurement)  Completed  . Pneumonia vaccines  Completed  *Topic was postponed. The date shown is not the original due date.   Preventive Care for Adults  A healthy lifestyle and preventive care can promote health and wellness. Preventive health guidelines for adults include the following key practices.  . A routine yearly physical is a good way to check with your health care provider about your health and preventive screening. It is a chance to share any concerns and updates on your health and to receive a thorough exam.  . Visit your dentist for a routine exam and preventive care every 6 months. Brush your teeth twice a day and floss once a day. Good oral hygiene prevents tooth decay and gum disease.  . The frequency of eye exams is based on your age, health, family medical history, use  of contact lenses, and other factors. Follow your health care provider's ecommendations for frequency of eye exams.  . Eat a healthy diet. Foods like vegetables, fruits, whole grains, low-fat dairy products, and lean protein foods contain the nutrients you need without too many calories. Decrease your intake of foods high in solid fats, added sugars, and salt. Eat the right amount of calories for you. Get information about a  proper diet from your health care provider, if necessary.  . Regular physical exercise is one of the most important things you can do for your health. Most adults should get at least 150 minutes of moderate-intensity exercise (any activity that increases your heart rate and causes you to sweat) each week. In addition, most adults need muscle-strengthening exercises on 2 or more days a week.  Silver Sneakers may be a benefit available to you. To determine eligibility, you may visit the website: www.silversneakers.com or contact program at (208)527-9923 Mon-Fri between 8AM-8PM.   . Maintain a healthy weight. The body mass index (BMI) is a screening tool to identify possible weight problems. It provides an estimate of body fat based on height and weight. Your health care provider can find your BMI and can help you achieve or maintain a healthy weight.   For adults 20 years and older: ? A BMI below 18.5 is considered underweight. ? A BMI of 18.5 to 24.9 is normal. ? A BMI of 25 to 29.9 is considered overweight. ? A BMI of 30 and above is considered obese.   . Maintain normal blood lipids and cholesterol levels by exercising and minimizing your intake of saturated fat. Eat a balanced diet with plenty of fruit and vegetables. Blood tests for lipids and cholesterol should begin at age 26 and be repeated every 5 years. If your lipid or cholesterol  levels are high, you are over 50, or you are at high risk for heart disease, you may need your cholesterol levels checked more frequently. Ongoing high lipid and cholesterol levels should be treated with medicines if diet and exercise are not working.  . If you smoke, find out from your health care provider how to quit. If you do not use tobacco, please do not start.  . If you choose to drink alcohol, please do not consume more than 2 drinks per day. One drink is considered to be 12 ounces (355 mL) of beer, 5 ounces (148 mL) of wine, or 1.5 ounces (44 mL) of  liquor.  . If you are 12-34 years old, ask your health care provider if you should take aspirin to prevent strokes.  . Use sunscreen. Apply sunscreen liberally and repeatedly throughout the day. You should seek shade when your shadow is shorter than you. Protect yourself by wearing long sleeves, pants, a wide-brimmed hat, and sunglasses year round, whenever you are outdoors.  . Once a month, do a whole body skin exam, using a mirror to look at the skin on your back. Tell your health care provider of new moles, moles that have irregular borders, moles that are larger than a pencil eraser, or moles that have changed in shape or color.

## 2016-06-09 NOTE — Progress Notes (Signed)
Pre visit review using our clinic review tool, if applicable. No additional management support is needed unless otherwise documented below in the visit note. 

## 2016-06-09 NOTE — Progress Notes (Signed)
Subjective:   Maria Simon is a 83 y.o. female who presents for Medicare Annual (Subsequent) preventive examination.  Review of Systems:  N/A Cardiac Risk Factors include: advanced age (>81men, >73 women);smoking/ tobacco exposure;dyslipidemia;hypertension     Objective:     Vitals: BP 112/80 (BP Location: Left Arm, Patient Position: Sitting, Cuff Size: Normal)   Pulse 87   Temp 97.6 F (36.4 C) (Oral)   Ht 5' 0.5" (1.537 m) Comment: no shoes  Wt 118 lb 8 oz (53.8 kg)   SpO2 93%   BMI 22.76 kg/m   Body mass index is 22.76 kg/m.   Tobacco History  Smoking Status  . Current Every Day Smoker  . Packs/day: 0.50  . Years: 61.00  . Types: Cigarettes  Smokeless Tobacco  . Never Used    Comment: 1/2 PPD usually, but sometimes more     Ready to quit: No Counseling given: No   Past Medical History:  Diagnosis Date  . CAD (coronary artery disease)    class 1-2 angina  . History of chicken pox   . History of TIAs 1998  . HLD (hyperlipidemia)   . HTN (hypertension)   . Osteoporosis    h/o compression fx T11, L5 by CT scan, Dexa 11/2010 -3.9 spine, -3.5 femur  . PAD (peripheral artery disease) ([place])   . Prediabetes 05/26/2011  . Smoker    ? COPD  . Status post thoracic aortic aneurysm repair 2006   graft in place, refused CT f/u in past.  . Systolic and diastolic CHF, chronic ([place])    last ultrasound prior to 2011, EF 10-15%, dilated likely nonischemic CM with CHF, will need to [place] with cards   Past Surgical History:  Procedure Laterality Date  . AORTA - SUPERIOR MESENTERIC ARTERY BYPASS GRAFT    . APPENDECTOMY  1950  . CT SCAN  07/2005   patent TAA graft, small endoleak, enlarging aneurysm.  patent SMA/celiac artery graft.  stable dissection of L iliac arteries, emphysema, , osteopenia, compression fx T11, L5, refused further CT scans  . DEXA  11/2010   -3.9 spine, -3.5 femur  . THORACIC AORTIC ANEURYSM REPAIR  01-11-05   first open then endovascular  graft; GORE TAG ref: [id number]; Lot: [id number]  . TOTAL ABDOMINAL HYSTERECTOMY  1973   ovaries out.  reason: bleeding  . US ECHOCARDIOGRAPHY  11/1503   systolic and diastolic dysfunction, EF 69-79%, dilated L vent, CAD, degenerative mitral valve with regurg, aortic sclerosis, R vent dysfunction, pulm HTN  . VEIN LIGATION AND STRIPPING     Family History  Problem Relation Age of Onset  . Mental illness Mother     deceased suicide  . Cancer Neg Hx   . Diabetes Neg Hx   . Stroke Neg Hx   . Hypertension Neg Hx    History  Sexual Activity  . Sexual activity: No    Outpatient Encounter Prescriptions as of 06/09/2016  Medication Sig  . aspirin 81 MG tablet Take 81 mg by mouth daily.   Marland Kitchen atorvastatin (LIPITOR) 80 MG tablet TAKE 1 TABLET BY MOUTH EVERY DAY  . Calcium Carb-Cholecalciferol (CALCIUM-VITAMIN D) 600-400 MG-UNIT TABS Take 1 tablet by mouth every other day.   . enalapril (VASOTEC) 10 MG tablet TAKE 1 TABLET BY MOUTH EVERY DAY  . hydrochlorothiazide (MICROZIDE) 12.5 MG capsule TAKE 1 CAPSULE BY MOUTH EVERY DAY  . metoprolol (LOPRESSOR) 50 MG tablet TAKE 1 TABLET BY MOUTH TWICE DAILY  . Multiple Vitamin (MULTIVITAMIN) tablet  Take 1 tablet by mouth daily. Centrum Silver  . [DISCONTINUED] atorvastatin (LIPITOR) 80 MG tablet TAKE 1 TABLET BY MOUTH EVERY DAY  . [DISCONTINUED] enalapril (VASOTEC) 10 MG tablet TAKE 1 TABLET BY MOUTH EVERY DAY  . [DISCONTINUED] hydrochlorothiazide (MICROZIDE) 12.5 MG capsule TAKE 1 CAPSULE BY MOUTH EVERY DAY  . [DISCONTINUED] metoprolol (LOPRESSOR) 50 MG tablet TAKE 1 TABLET BY MOUTH TWICE DAILY   No facility-administered encounter medications on file as of 06/09/2016.     Activities of Daily Living In your present state of health, do you have any difficulty performing the following activities: 06/09/2016  Hearing? N  Vision? N  Difficulty concentrating or making decisions? N  Walking or climbing stairs? N  Dressing or bathing? N  Doing errands, shopping? Y   Preparing Food and eating ? N  Using the Toilet? N  In the past six months, have you accidently leaked urine? N  Do you have problems with loss of bowel control? N  Managing your Medications? N  Managing your Finances? N  Housekeeping or managing your Housekeeping? N  Some recent data might be hidden    Patient Care Team: Ria Bush, MD as PCP - General (Family Medicine)    Assessment:     Hearing Screening   125Hz  250Hz  500Hz  1000Hz  2000Hz  3000Hz  4000Hz  6000Hz  8000Hz   Right ear:   0 0 40  0    Left ear:   0 0 40  0      Visual Acuity Screening   Right eye Left eye Both eyes  Without correction:     With correction: 20/200 20/50 20/50-1    Exercise Activities and Dietary recommendations Current Exercise Habits: Home exercise routine, Type of exercise: walking, Time (Minutes): 20, Frequency (Times/Week): 3, Weekly Exercise (Minutes/Week): 60, Intensity: Mild, Exercise limited by: None identified  Goals    . Increase physical activity          Starting Spring 2018 when weather permits, I will increase physical activity by walking 3 days per week for at least 20 min.        Fall Risk Fall Risk  06/09/2016 04/30/2015 01/23/2014 05/31/2012  Falls in the past year? No No No No   Depression Screen PHQ 2/9 Scores 06/09/2016 04/30/2015 01/23/2014 05/31/2012  PHQ - 2 Score 0 0 0 0     Cognitive Function MMSE - Mini Mental State Exam 06/09/2016  Orientation to time 5  Orientation to Place 5  Registration 3  Attention/ Calculation 0  Recall 3  Language- name 2 objects 0  Language- repeat 1  Language- follow 3 step command 3  Language- read & follow direction 0  Write a sentence 0  Copy design 0  Total score 20       PLEASE NOTE: A Mini-Cog screen was completed. Maximum score is 20. A value of 0 denotes this part of [place] MMSE was not completed or the patient failed this part of the Mini-Cog screening.   Mini-Cog Screening Orientation to Time - Max 5  pts Orientation to Place - Max 5 pts Registration - Max 3 pts Recall - Max 3 pts Language Repeat - Max 1 pts Language Follow 3 Step Command - Max 3 pts   Immunization History  Administered Date(s) Administered  . Influenza Split 11/25/2010, 12/01/2011  . Influenza,inj,Quad PF,36+ Mos 12/13/2012, 12/12/2013, 11/27/2014, 11/26/2015  . Pneumococcal Conjugate-13 12/12/2013  . Pneumococcal Polysaccharide-23 02/11/2006   Screening Tests Health Maintenance  Topic Date Due  . DTaP/Tdap/Td (1 -  Tdap) 04/09/2025 (Originally 10/05/1952)  . TETANUS/TDAP  04/09/2025 (Originally 10/05/1952)  . INFLUENZA VACCINE  10/04/2016  . DEXA SCAN  Completed  . PNA vac Low Risk Adult  Completed      Plan:   I have personally reviewed and addressed the Medicare Annual Wellness questionnaire and have noted the following in the patient's chart:  A. Medical and social history B. Use of alcohol, tobacco or illicit drugs  C. Current medications and supplements D. Functional ability and status E.  Nutritional status F.  Physical activity G. Advance directives H. List of other physicians I.  Hospitalizations, surgeries, and ER visits in previous 12 months J.  [place] to include hearing, vision, cognitive, depression L. Referrals and appointments - none  In addition, I have reviewed and discussed with patient certain preventive protocols, quality metrics, and best practice recommendations. A written personalized care plan for preventive services as well as general preventive health recommendations were provided to patient.  See attached scanned questionnaire for additional information.   Signed,   Lindell Noe, MHA, BS, LPN Health Coach

## 2016-06-09 NOTE — Progress Notes (Signed)
I reviewed health advisor's note, was available for consultation, and agree with documentation and plan.  

## 2016-06-09 NOTE — Progress Notes (Signed)
PCP notes:   Health maintenance:  No gaps identified.  Abnormal screenings:   Hearing - failed  Patient concerns:   None  Nurse concerns:  None  Next PCP appt:   06/16/16 @ 1415

## 2016-06-16 ENCOUNTER — Ambulatory Visit (INDEPENDENT_AMBULATORY_CARE_PROVIDER_SITE_OTHER): Payer: Medicare Other | Admitting: Family Medicine

## 2016-06-16 ENCOUNTER — Encounter: Payer: Self-pay | Admitting: Family Medicine

## 2016-06-16 VITALS — BP 130/80 | HR 76 | Temp 97.8°F | Wt 118.8 lb

## 2016-06-16 DIAGNOSIS — M810 Age-related osteoporosis without current pathological fracture: Secondary | ICD-10-CM

## 2016-06-16 DIAGNOSIS — F17200 Nicotine dependence, unspecified, uncomplicated: Secondary | ICD-10-CM

## 2016-06-16 DIAGNOSIS — Z66 Do not resuscitate: Secondary | ICD-10-CM | POA: Diagnosis not present

## 2016-06-16 DIAGNOSIS — I509 Heart failure, unspecified: Secondary | ICD-10-CM | POA: Diagnosis not present

## 2016-06-16 DIAGNOSIS — E7800 Pure hypercholesterolemia, unspecified: Secondary | ICD-10-CM | POA: Diagnosis not present

## 2016-06-16 DIAGNOSIS — I10 Essential (primary) hypertension: Secondary | ICD-10-CM | POA: Diagnosis not present

## 2016-06-16 DIAGNOSIS — Z9889 Other specified postprocedural states: Secondary | ICD-10-CM

## 2016-06-16 DIAGNOSIS — I2510 Atherosclerotic heart disease of native coronary artery without angina pectoris: Secondary | ICD-10-CM

## 2016-06-16 DIAGNOSIS — R7303 Prediabetes: Secondary | ICD-10-CM

## 2016-06-16 DIAGNOSIS — Z8679 Personal history of other diseases of the circulatory system: Secondary | ICD-10-CM

## 2016-06-16 NOTE — Progress Notes (Signed)
BP 130/80   Pulse 76   Temp 97.8 F (36.6 C) (Oral)   Wt 118 lb 12 oz (53.9 kg)   SpO2 96%   BMI 22.81 kg/m    CC: AMW f/u visit Subjective:    Patient ID: Maria Simon, female    DOB: [birth year], 83 y.o.   MRN: [id number]  HPI: Maria Simon is a 83 y.o. female presenting on 06/16/2016 for Annual Exam   Saw Katha Cabal last week for medicare wellness visit. Note reviewed.    OP - fosamax caused groin pain. Not interested in prolia or other medication.   h/o thoracic aortic aneurysm repair by Dr Sammuel Hines 2005. Has not returned for f/u. Denies chest pain, tightness, dyspnea or palpitations. No dizziness or other symptoms.   Relevant past medical, surgical, family and social history reviewed and updated as indicated. Interim medical history since our last visit reviewed. Allergies and medications reviewed and updated. Outpatient Medications Prior to Visit  Medication Sig Dispense Refill  . aspirin 81 MG tablet Take 81 mg by mouth daily.     Marland Kitchen atorvastatin (LIPITOR) 80 MG tablet TAKE 1 TABLET BY MOUTH EVERY DAY 90 tablet 3  . Calcium Carb-Cholecalciferol (CALCIUM-VITAMIN D) 600-400 MG-UNIT TABS Take 1 tablet by mouth every other day.     . enalapril (VASOTEC) 10 MG tablet TAKE 1 TABLET BY MOUTH EVERY DAY 90 tablet 3  . hydrochlorothiazide (MICROZIDE) 12.5 MG capsule TAKE 1 CAPSULE BY MOUTH EVERY DAY 90 capsule 3  . metoprolol (LOPRESSOR) 50 MG tablet TAKE 1 TABLET BY MOUTH TWICE DAILY 180 tablet 1  . Multiple Vitamin (MULTIVITAMIN) tablet Take 1 tablet by mouth daily. Centrum Silver     No facility-administered medications prior to visit.      Per HPI unless specifically indicated in ROS section below Review of Systems     Objective:    BP 130/80   Pulse 76   Temp 97.8 F (36.6 C) (Oral)   Wt 118 lb 12 oz (53.9 kg)   SpO2 96%   BMI 22.81 kg/m   Wt Readings from Last 3 Encounters:  06/16/16 118 lb 12 oz (53.9 kg)  06/09/16 118 lb 8 oz (53.8 kg)  11/26/15 121 lb  (54.9 kg)    Physical Exam  Constitutional: She is oriented to person, place, and time. She appears well-developed and well-nourished. No distress.  HENT:  Head: Normocephalic and atraumatic.  Right Ear: Hearing, tympanic membrane, external ear and ear canal normal.  Left Ear: Hearing, tympanic membrane, external ear and ear canal normal.  Nose: Nose normal.  Mouth/Throat: Uvula is midline, oropharynx is clear and moist and mucous membranes are normal. No oropharyngeal exudate, posterior oropharyngeal edema or posterior oropharyngeal erythema.  Eyes: Conjunctivae and EOM are normal. Pupils are equal, round, and reactive to light. No scleral icterus.  Neck: Normal range of motion. Neck supple. Carotid bruit is not present. No thyromegaly present.  Cardiovascular: Normal rate, regular rhythm, normal heart sounds and intact distal pulses.   No murmur heard. Pulses:      Radial pulses are 2+ on the right side, and 2+ on the left side.  Pulmonary/Chest: Effort normal and breath sounds normal. No respiratory distress. She has no wheezes. She has no rales.  Abdominal: Soft. Bowel sounds are normal. She exhibits no distension and no mass. There is no tenderness. There is no rebound and no guarding.  Musculoskeletal: Normal range of motion. She exhibits no edema.  Kyphosis and thoracic scoliosis  present  Lymphadenopathy:    She has no cervical adenopathy.  Neurological: She is alert and oriented to person, place, and time.  CN grossly intact, station and gait intact  Skin: Skin is warm and dry. No rash noted.  Psychiatric: She has a normal mood and affect. Her behavior is normal. Judgment and thought content normal.  Nursing note and vitals reviewed.  Results for orders placed or performed in visit on 06/09/16  Lipid panel  Result Value Ref Range   Cholesterol 106 0 - 200 mg/dL   Triglycerides 102.0 0.0 - 149.0 mg/dL   HDL 32.20 (L) >39.00 mg/dL   VLDL 20.4 0.0 - 40.0 mg/dL   LDL  Cholesterol 54 0 - 99 mg/dL   Total CHOL/HDL Ratio 3    NonHDL 74.11   Hemoglobin A1c  Result Value Ref Range   Hgb A1c MFr Bld 6.3 4.6 - 6.5 %  Basic metabolic panel  Result Value Ref Range   Sodium 140 135 - 145 mEq/L   Potassium 5.1 3.5 - 5.1 mEq/L   Chloride 102 96 - 112 mEq/L   CO2 32 19 - 32 mEq/L   Glucose, Bld 130 (H) 70 - 99 mg/dL   BUN 22 6 - 23 mg/dL   Creatinine, Ser 0.93 0.40 - 1.20 mg/dL   Calcium 10.8 (H) 8.4 - 10.5 mg/dL   GFR 61.55 >60.00 mL/min  VITAMIN D 25 Hydroxy (Vit-D Deficiency, Fractures)  Result Value Ref Range   VITD 38.56 30.00 - 100.00 ng/mL      Assessment & Plan:   Problem List Items Addressed This Visit    CAD (coronary artery disease)    Continue aspirin, statin.       CHF (congestive heart failure) ([place])    No recent evaluation.  asxs.      DNR (do not resuscitate)   HLD (hyperlipidemia)    Chronic, stable. Continue current regimen.       HTN (hypertension)    Chronic, stable. Continue current regimen.       Osteoporosis - Primary    Completed 33mo fosamax, stopped due to groin pain.  Not interested in further eval/treatment for this.  Discussed calcium/vit D intake and importance of regular weight bearing exercise.       Prediabetes    Discussed with patient - encouraged avoid added sugars.       Smoker    Continued smoker. precontemplative. Continue to encourage cessation.       Relevant Orders   CT ANGIO CHEST AORTA W/CM &/OR WO/CM   Status post thoracic aortic aneurysm repair    Will schedule CTA - overdue for f/u. Has not seen Dr Katy Apo in ~10 yrs. asxs. Continues aspirin, statin, good blood pressure control.       Relevant Orders   CT ANGIO CHEST AORTA W/CM &/OR WO/CM       Follow up plan: Return in about 1 year (around 06/16/2017) for medicare wellness visit, follow up visit.  Ria Bush, MD

## 2016-06-16 NOTE — Assessment & Plan Note (Signed)
Chronic, stable. Continue current regimen. 

## 2016-06-16 NOTE — Assessment & Plan Note (Signed)
No recent evaluation.  asxs.

## 2016-06-16 NOTE — Assessment & Plan Note (Signed)
Continue aspirin, statin.  

## 2016-06-16 NOTE — Assessment & Plan Note (Signed)
Will schedule CTA - overdue for f/u. Has not seen Dr Katy Apo in ~10 yrs. asxs. Continues aspirin, statin, good blood pressure control.

## 2016-06-16 NOTE — Patient Instructions (Addendum)
I recommend regular weight bearing exercise and some resistance training - start walking around the neighborhood, then add 5 lb weights to walks.  Continue good calcium in the diet.  We may consider ultrasound of heart or CT of aorta to follow up on history of thoracic aorta repair.  Return in 1 year for follow up visit after medicare wellness visit. Return in 6 months for lab visit only (calcium).   Osteoporosis Osteoporosis is the thinning and loss of density in the bones. Osteoporosis makes the bones more brittle, fragile, and likely to break (fracture). Over time, osteoporosis can cause the bones to become so weak that they fracture after a simple fall. The bones most likely to fracture are the bones in the hip, wrist, and spine. What are the causes? The exact cause is not known. What increases the risk? Anyone can develop osteoporosis. You may be at greater risk if you have a family history of the condition or have poor nutrition. You may also have a higher risk if you are:  Female.  81 years old or older.  A smoker.  Not physically active.  White or Asian.  Slender. What are the signs or symptoms? A fracture might be the first sign of the disease, especially if it results from a fall or injury that would not usually cause a bone to break. Other signs and symptoms include:  Low back and neck pain.  Stooped posture.  Height loss. How is this diagnosed? To make a diagnosis, your health care provider may:  Take a medical history.  Perform a physical exam.  Order tests, such as:  A bone mineral density test.  A dual-energy X-ray absorptiometry test. How is this treated? The goal of osteoporosis treatment is to strengthen your bones to reduce your risk of a fracture. Treatment may involve:  Making lifestyle changes, such as:  Eating a diet rich in calcium.  Doing weight-bearing and muscle-strengthening exercises.  Stopping tobacco use.  Limiting alcohol  intake.  Taking medicine to slow the process of bone loss or to increase bone density.  Monitoring your levels of calcium and vitamin D. Follow these instructions at home:  Include calcium and vitamin D in your diet. Calcium is important for bone health, and vitamin D helps the body absorb calcium.  Perform weight-bearing and muscle-strengthening exercises as directed by your health care provider.  Do not use any tobacco products, including cigarettes, chewing tobacco, and electronic cigarettes. If you need help quitting, ask your health care provider.  Limit your alcohol intake.  Take medicines only as directed by your health care provider.  Keep all follow-up visits as directed by your health care provider. This is important.  Take precautions at home to lower your risk of falling, such as:  Keeping rooms well lit and clutter free.  Installing safety rails on stairs.  Using rubber mats in the bathroom and other areas that are often wet or slippery. Get help right away if: You fall or injure yourself. This information is not intended to replace advice given to you by your health care provider. Make sure you discuss any questions you have with your health care provider. Document Released: 11/30/2004 Document Revised: 07/26/2015 Document Reviewed: 07/31/2013 Elsevier Interactive Patient Education  2017 [employer].

## 2016-06-16 NOTE — Assessment & Plan Note (Signed)
Completed 10mo fosamax, stopped due to groin pain.  Not interested in further eval/treatment for this.  Discussed calcium/vit D intake and importance of regular weight bearing exercise.

## 2016-06-16 NOTE — Assessment & Plan Note (Signed)
Continued smoker. precontemplative. Continue to encourage cessation.

## 2016-06-16 NOTE — Assessment & Plan Note (Signed)
Discussed with patient - encouraged avoid added sugars.

## 2016-06-16 NOTE — Progress Notes (Signed)
Pre visit review using our clinic review tool, if applicable. No additional management support is needed unless otherwise documented below in the visit note. 

## 2016-06-27 ENCOUNTER — Encounter: Payer: Self-pay | Admitting: Family Medicine

## 2016-06-27 ENCOUNTER — Other Ambulatory Visit: Payer: Self-pay | Admitting: Family Medicine

## 2016-06-27 ENCOUNTER — Ambulatory Visit (INDEPENDENT_AMBULATORY_CARE_PROVIDER_SITE_OTHER)
Admission: RE | Admit: 2016-06-27 | Discharge: 2016-06-27 | Disposition: A | Discharge status: Home / Self Care | Payer: Medicare Other | Source: Ambulatory Visit | Attending: Family Medicine | Admitting: Family Medicine

## 2016-06-27 DIAGNOSIS — Z8679 Personal history of other diseases of the circulatory system: Secondary | ICD-10-CM | POA: Diagnosis not present

## 2016-06-27 DIAGNOSIS — Z9889 Other specified postprocedural states: Secondary | ICD-10-CM

## 2016-06-27 DIAGNOSIS — F17200 Nicotine dependence, unspecified, uncomplicated: Secondary | ICD-10-CM

## 2016-06-27 DIAGNOSIS — I712 Thoracic aortic aneurysm, without rupture, unspecified: Secondary | ICD-10-CM

## 2016-06-27 DIAGNOSIS — J439 Emphysema, unspecified: Secondary | ICD-10-CM | POA: Insufficient documentation

## 2016-06-27 MED ORDER — IOPAMIDOL (ISOVUE-370) INJECTION 76%
100.0000 mL | Freq: Once | INTRAVENOUS | Status: AC | PRN
  Administered 2016-06-27: 100 mL via INTRAVENOUS

## 2016-08-22 ENCOUNTER — Emergency Department (HOSPITAL_COMMUNITY): Payer: Medicare Other

## 2016-08-22 ENCOUNTER — Observation Stay (HOSPITAL_COMMUNITY): Payer: Medicare Other

## 2016-08-22 ENCOUNTER — Inpatient Hospital Stay (HOSPITAL_COMMUNITY)
Admission: EM | Admit: 2016-08-22 | Discharge: 2016-08-28 | DRG: 189 | Disposition: A | Discharge status: Home / Self Care | Payer: Medicare Other | Attending: Internal Medicine | Admitting: Internal Medicine

## 2016-08-22 ENCOUNTER — Encounter (HOSPITAL_COMMUNITY): Payer: Self-pay

## 2016-08-22 DIAGNOSIS — Z9889 Other specified postprocedural states: Secondary | ICD-10-CM

## 2016-08-22 DIAGNOSIS — I252 Old myocardial infarction: Secondary | ICD-10-CM

## 2016-08-22 DIAGNOSIS — E876 Hypokalemia: Secondary | ICD-10-CM | POA: Diagnosis not present

## 2016-08-22 DIAGNOSIS — Z8679 Personal history of other diseases of the circulatory system: Secondary | ICD-10-CM | POA: Diagnosis not present

## 2016-08-22 DIAGNOSIS — J440 Chronic obstructive pulmonary disease with acute lower respiratory infection: Secondary | ICD-10-CM | POA: Diagnosis present

## 2016-08-22 DIAGNOSIS — R0600 Dyspnea, unspecified: Secondary | ICD-10-CM | POA: Diagnosis not present

## 2016-08-22 DIAGNOSIS — Z8673 Personal history of transient ischemic attack (TIA), and cerebral infarction without residual deficits: Secondary | ICD-10-CM

## 2016-08-22 DIAGNOSIS — E785 Hyperlipidemia, unspecified: Secondary | ICD-10-CM | POA: Diagnosis present

## 2016-08-22 DIAGNOSIS — B349 Viral infection, unspecified: Secondary | ICD-10-CM | POA: Diagnosis present

## 2016-08-22 DIAGNOSIS — F17210 Nicotine dependence, cigarettes, uncomplicated: Secondary | ICD-10-CM | POA: Diagnosis present

## 2016-08-22 DIAGNOSIS — J9601 Acute respiratory failure with hypoxia: Principal | ICD-10-CM | POA: Diagnosis present

## 2016-08-22 DIAGNOSIS — I2510 Atherosclerotic heart disease of native coronary artery without angina pectoris: Secondary | ICD-10-CM | POA: Diagnosis present

## 2016-08-22 DIAGNOSIS — R0601 Orthopnea: Secondary | ICD-10-CM

## 2016-08-22 DIAGNOSIS — N2889 Other specified disorders of kidney and ureter: Secondary | ICD-10-CM | POA: Diagnosis present

## 2016-08-22 DIAGNOSIS — I110 Hypertensive heart disease with heart failure: Secondary | ICD-10-CM | POA: Diagnosis not present

## 2016-08-22 DIAGNOSIS — D696 Thrombocytopenia, unspecified: Secondary | ICD-10-CM | POA: Diagnosis present

## 2016-08-22 DIAGNOSIS — I5043 Acute on chronic combined systolic (congestive) and diastolic (congestive) heart failure: Secondary | ICD-10-CM | POA: Diagnosis present

## 2016-08-22 DIAGNOSIS — J441 Chronic obstructive pulmonary disease with (acute) exacerbation: Secondary | ICD-10-CM | POA: Diagnosis present

## 2016-08-22 DIAGNOSIS — R9431 Abnormal electrocardiogram [ECG] [EKG]: Secondary | ICD-10-CM | POA: Diagnosis present

## 2016-08-22 DIAGNOSIS — R069 Unspecified abnormalities of breathing: Secondary | ICD-10-CM | POA: Diagnosis not present

## 2016-08-22 DIAGNOSIS — J209 Acute bronchitis, unspecified: Secondary | ICD-10-CM

## 2016-08-22 DIAGNOSIS — I5023 Acute on chronic systolic (congestive) heart failure: Secondary | ICD-10-CM | POA: Diagnosis present

## 2016-08-22 DIAGNOSIS — R0602 Shortness of breath: Secondary | ICD-10-CM | POA: Diagnosis not present

## 2016-08-22 DIAGNOSIS — R05 Cough: Secondary | ICD-10-CM | POA: Diagnosis not present

## 2016-08-22 DIAGNOSIS — Z66 Do not resuscitate: Secondary | ICD-10-CM | POA: Diagnosis present

## 2016-08-22 HISTORY — DX: Acute bronchitis, unspecified: J20.9

## 2016-08-22 LAB — CBC WITH DIFFERENTIAL/PLATELET
BASOS PCT: 0 %
Basophils Absolute: 0 10*3/uL (ref 0.0–0.1)
EOS ABS: 0 10*3/uL (ref 0.0–0.7)
Eosinophils Relative: 1 %
HCT: 42.9 % (ref 36.0–46.0)
Hemoglobin: 13.6 g/dL (ref 12.0–15.0)
Lymphocytes Relative: 28 %
Lymphs Abs: 1.5 10*3/uL (ref 0.7–4.0)
MCH: 26.9 pg (ref 26.0–34.0)
MCHC: 31.7 g/dL (ref 30.0–36.0)
MCV: 84.8 fL (ref 78.0–100.0)
MONO ABS: 0.4 10*3/uL (ref 0.1–1.0)
MONOS PCT: 8 %
Neutro Abs: 3.4 10*3/uL (ref 1.7–7.7)
Neutrophils Relative %: 63 %
Platelets: 137 10*3/uL — ABNORMAL LOW (ref 150–400)
RBC: 5.06 MIL/uL (ref 3.87–5.11)
RDW: 15.5 % (ref 11.5–15.5)
WBC: 5.4 10*3/uL (ref 4.0–10.5)

## 2016-08-22 LAB — COMPREHENSIVE METABOLIC PANEL
ALBUMIN: 3.6 g/dL (ref 3.5–5.0)
ALK PHOS: 75 U/L (ref 38–126)
ALT: 19 U/L (ref 14–54)
AST: 33 U/L (ref 15–41)
Anion gap: 11 (ref 5–15)
BUN: 10 mg/dL (ref 6–20)
CALCIUM: 10 mg/dL (ref 8.9–10.3)
CO2: 30 mmol/L (ref 22–32)
Chloride: 100 mmol/L — ABNORMAL LOW (ref 101–111)
Creatinine, Ser: 0.82 mg/dL (ref 0.44–1.00)
GFR calc Af Amer: >60 mL/min (ref >60)
GFR calc non Af Amer: >60 mL/min (ref >60)
GLUCOSE: 132 mg/dL — AB (ref 65–99)
Potassium: 3.5 mmol/L (ref 3.5–5.1)
SODIUM: 141 mmol/L (ref 135–145)
Total Bilirubin: 0.9 mg/dL (ref 0.3–1.2)
Total Protein: 6.8 g/dL (ref 6.5–8.1)

## 2016-08-22 LAB — PROCALCITONIN: Procalcitonin: <0.1 ng/mL

## 2016-08-22 LAB — I-STAT CHEM 8, ED
BUN: 13 mg/dL (ref 6–20)
CALCIUM ION: 1.26 mmol/L (ref 1.15–1.40)
CHLORIDE: 98 mmol/L — AB (ref 101–111)
CREATININE: 0.7 mg/dL (ref 0.44–1.00)
GLUCOSE: 132 mg/dL — AB (ref 65–99)
HCT: 43 % (ref 36.0–46.0)
Hemoglobin: 14.6 g/dL (ref 12.0–15.0)
Potassium: 3.4 mmol/L — ABNORMAL LOW (ref 3.5–5.1)
Sodium: 140 mmol/L (ref 135–145)
TCO2: 32 mmol/L (ref 0–100)

## 2016-08-22 LAB — RESPIRATORY PANEL BY PCR
ADENOVIRUS-RVPPCR: NOT DETECTED
BORDETELLA PERTUSSIS-RVPCR: NOT DETECTED
CHLAMYDOPHILA PNEUMONIAE-RVPPCR: NOT DETECTED
CORONAVIRUS HKU1-RVPPCR: NOT DETECTED
CORONAVIRUS NL63-RVPPCR: NOT DETECTED
Coronavirus 229E: NOT DETECTED
Coronavirus OC43: NOT DETECTED
INFLUENZA A-RVPPCR: NOT DETECTED
Influenza B: NOT DETECTED
MYCOPLASMA PNEUMONIAE-RVPPCR: NOT DETECTED
Metapneumovirus: NOT DETECTED
PARAINFLUENZA VIRUS 4-RVPPCR: NOT DETECTED
Parainfluenza Virus 1: NOT DETECTED
Parainfluenza Virus 2: NOT DETECTED
Parainfluenza Virus 3: NOT DETECTED
Respiratory Syncytial Virus: NOT DETECTED
Rhinovirus / Enterovirus: DETECTED — AB

## 2016-08-22 LAB — BRAIN NATRIURETIC PEPTIDE: B NATRIURETIC PEPTIDE 5: 188.5 pg/mL — AB (ref 0.0–100.0)

## 2016-08-22 LAB — TROPONIN I: Troponin I: <0.03 ng/mL (ref <0.03)

## 2016-08-22 LAB — STREP PNEUMONIAE URINARY ANTIGEN: STREP PNEUMO URINARY ANTIGEN: NEGATIVE

## 2016-08-22 LAB — I-STAT TROPONIN, ED: Troponin i, poc: 0 ng/mL (ref 0.00–0.08)

## 2016-08-22 MED ORDER — METHYLPREDNISOLONE SODIUM SUCC 40 MG IJ SOLR: 40.0000 mg | Freq: Every day | INTRAMUSCULAR | Status: DC

## 2016-08-22 MED ORDER — ENALAPRIL MALEATE 10 MG PO TABS
10.0000 mg | ORAL_TABLET | Freq: Every day | ORAL | Status: DC
  Administered 2016-08-22 – 2016-08-28 (×6): 10 mg via ORAL
  Filled 2016-08-22 (×7): qty 1

## 2016-08-22 MED ORDER — ADULT MULTIVITAMIN W/MINERALS CH
1.0000 | ORAL_TABLET | Freq: Every day | ORAL | Status: DC
  Administered 2016-08-22 – 2016-08-28 (×7): 1 via ORAL
  Filled 2016-08-22 (×7): qty 1

## 2016-08-22 MED ORDER — CALCIUM-VITAMIN D 600-400 MG-UNIT PO TABS: 1.0000 | ORAL_TABLET | ORAL | Status: DC

## 2016-08-22 MED ORDER — FUROSEMIDE 10 MG/ML IJ SOLN
40.0000 mg | Freq: Once | INTRAMUSCULAR | Status: AC
  Administered 2016-08-22: 40 mg via INTRAVENOUS
  Filled 2016-08-22: qty 4

## 2016-08-22 MED ORDER — BENZONATATE 100 MG PO CAPS
100.0000 mg | ORAL_CAPSULE | Freq: Two times a day (BID) | ORAL | Status: DC | PRN
  Administered 2016-08-26: 100 mg via ORAL
  Filled 2016-08-22: qty 1

## 2016-08-22 MED ORDER — SODIUM CHLORIDE 0.9 % IV SOLN: 250.0000 mL | INTRAVENOUS | Status: DC | PRN

## 2016-08-22 MED ORDER — ATORVASTATIN CALCIUM 80 MG PO TABS
80.0000 mg | ORAL_TABLET | Freq: Every day | ORAL | Status: DC
  Administered 2016-08-22 – 2016-08-28 (×7): 80 mg via ORAL
  Filled 2016-08-22 (×7): qty 1

## 2016-08-22 MED ORDER — ALBUTEROL SULFATE (2.5 MG/3ML) 0.083% IN NEBU
5.0000 mg | INHALATION_SOLUTION | Freq: Once | RESPIRATORY_TRACT | Status: AC
Start: 2016-08-22 — End: 2016-08-22
  Administered 2016-08-22: 5 mg via RESPIRATORY_TRACT
  Filled 2016-08-22: qty 6

## 2016-08-22 MED ORDER — LEVOFLOXACIN IN D5W 750 MG/150ML IV SOLN
750.0000 mg | INTRAVENOUS | Status: AC
  Administered 2016-08-22 – 2016-08-26 (×3): 750 mg via INTRAVENOUS
  Filled 2016-08-22 (×3): qty 150

## 2016-08-22 MED ORDER — SODIUM CHLORIDE 0.9% FLUSH
3.0000 mL | INTRAVENOUS | Status: DC | PRN
  Administered 2016-08-22: 3 mL via INTRAVENOUS
  Filled 2016-08-22: qty 3

## 2016-08-22 MED ORDER — ONE-DAILY MULTI VITAMINS PO TABS: 1.0000 | ORAL_TABLET | Freq: Every day | ORAL | Status: DC

## 2016-08-22 MED ORDER — IOPAMIDOL (ISOVUE-370) INJECTION 76%
INTRAVENOUS | Status: AC
  Administered 2016-08-22: 80 mL
  Filled 2016-08-22: qty 100

## 2016-08-22 MED ORDER — METHYLPREDNISOLONE SODIUM SUCC 125 MG IJ SOLR
80.0000 mg | Freq: Once | INTRAMUSCULAR | Status: AC
  Administered 2016-08-22: 80 mg via INTRAVENOUS
  Filled 2016-08-22: qty 2

## 2016-08-22 MED ORDER — LEVALBUTEROL HCL 0.63 MG/3ML IN NEBU: 0.6300 mg | INHALATION_SOLUTION | Freq: Three times a day (TID) | RESPIRATORY_TRACT | Status: DC

## 2016-08-22 MED ORDER — POTASSIUM CHLORIDE CRYS ER 20 MEQ PO TBCR
40.0000 meq | EXTENDED_RELEASE_TABLET | Freq: Once | ORAL | Status: AC
  Administered 2016-08-22: 40 meq via ORAL
  Filled 2016-08-22: qty 2

## 2016-08-22 MED ORDER — SODIUM CHLORIDE 0.9% FLUSH
3.0000 mL | Freq: Two times a day (BID) | INTRAVENOUS | Status: DC
  Administered 2016-08-22 – 2016-08-28 (×12): 3 mL via INTRAVENOUS

## 2016-08-22 MED ORDER — CALCIUM CARBONATE-VITAMIN D 500-200 MG-UNIT PO TABS
1.0000 | ORAL_TABLET | Freq: Every day | ORAL | Status: DC
  Administered 2016-08-22 – 2016-08-28 (×7): 1 via ORAL
  Filled 2016-08-22 (×7): qty 1

## 2016-08-22 MED ORDER — METHYLPREDNISOLONE SODIUM SUCC 125 MG IJ SOLR: 80.0000 mg | Freq: Every day | INTRAMUSCULAR | Status: DC

## 2016-08-22 MED ORDER — ACETAMINOPHEN 325 MG PO TABS
650.0000 mg | ORAL_TABLET | ORAL | Status: DC | PRN
  Administered 2016-08-22 – 2016-08-25 (×5): 650 mg via ORAL
  Filled 2016-08-22 (×6): qty 2

## 2016-08-22 MED ORDER — LEVALBUTEROL HCL 0.63 MG/3ML IN NEBU
0.6300 mg | INHALATION_SOLUTION | Freq: Three times a day (TID) | RESPIRATORY_TRACT | Status: DC
  Administered 2016-08-22 – 2016-08-24 (×6): 0.63 mg via RESPIRATORY_TRACT
  Filled 2016-08-22 (×6): qty 3

## 2016-08-22 MED ORDER — ASPIRIN 81 MG PO TABS: 81.0000 mg | ORAL_TABLET | Freq: Every day | ORAL | Status: DC

## 2016-08-22 MED ORDER — ENOXAPARIN SODIUM 40 MG/0.4ML ~~LOC~~ SOLN
40.0000 mg | SUBCUTANEOUS | Status: DC
  Administered 2016-08-22 – 2016-08-27 (×6): 40 mg via SUBCUTANEOUS
  Filled 2016-08-22 (×6): qty 0.4

## 2016-08-22 MED ORDER — PREDNISONE 20 MG PO TABS
40.0000 mg | ORAL_TABLET | Freq: Every day | ORAL | Status: DC
  Administered 2016-08-23 – 2016-08-26 (×4): 40 mg via ORAL
  Filled 2016-08-22 (×4): qty 2

## 2016-08-22 MED ORDER — ASPIRIN 81 MG PO CHEW
81.0000 mg | CHEWABLE_TABLET | Freq: Every day | ORAL | Status: DC
  Administered 2016-08-22 – 2016-08-28 (×7): 81 mg via ORAL
  Filled 2016-08-22 (×7): qty 1

## 2016-08-22 MED ORDER — METOPROLOL TARTRATE 50 MG PO TABS
50.0000 mg | ORAL_TABLET | Freq: Two times a day (BID) | ORAL | Status: DC
  Administered 2016-08-22 – 2016-08-28 (×11): 50 mg via ORAL
  Filled 2016-08-22 (×12): qty 1

## 2016-08-22 MED ORDER — ONDANSETRON HCL 4 MG/2ML IJ SOLN: 4.0000 mg | Freq: Four times a day (QID) | INTRAMUSCULAR | Status: DC | PRN

## 2016-08-22 MED ORDER — LORAZEPAM 2 MG/ML IJ SOLN
0.5000 mg | Freq: Four times a day (QID) | INTRAMUSCULAR | Status: DC | PRN
  Administered 2016-08-22 (×2): 0.5 mg via INTRAVENOUS
  Filled 2016-08-22 (×2): qty 1

## 2016-08-22 NOTE — ED Notes (Addendum)
Patient transported to X-ray and CT 

## 2016-08-22 NOTE — ED Notes (Signed)
Pt ambulated to restroom; sats 91-93% RA. HR 115-120.

## 2016-08-22 NOTE — ED Provider Notes (Signed)
[facility] DEPT Provider Note   CSN: [id number] Arrival date & time: 08/22/16  7989     History   Chief Complaint Chief Complaint  Patient presents with  . Shortness of Breath    HPI Maria Simon is a 83 y.o. female.  The history is provided by the patient. No language interpreter was used.    Maria Simon is a 83 y.o. female who presents to the Emergency Department complaining of SOB.  Over the last week she has developed progressive shortness of breath with significant dyspnea on exertion and orthopnea. She is unable to sleep at night due to the shortness of breath. She has an associated cough productive of clear mucus. No chest pain, abdominal pain, vomiting, diarrhea, leg swelling or pain. Hemoptysis. She describes night sweats of an ongoing for the last several months. No reports of fevers. She has no history of lung disease and states there is been no significant change after breathing treatments.  Past Medical History:  Diagnosis Date  . CAD (coronary artery disease)    class 1-2 angina  . History of chicken pox   . History of TIAs 1998  . HLD (hyperlipidemia)   . HTN (hypertension)   . Osteoporosis    h/o compression fx T11, L5 by CT scan, Dexa 11/2010 -3.9 spine, -3.5 femur  . PAD (peripheral artery disease) ([place])   . Prediabetes 05/26/2011  . Smoker    ? COPD  . Status post thoracic aortic aneurysm repair 2006   graft in place, refused CT f/u in past.  . Systolic and diastolic CHF, chronic ([place])    last ultrasound prior to 2011, EF 10-15%, dilated likely nonischemic CM with CHF, will need to [place] with cards    Patient Active Problem List   Diagnosis Date Noted  . Emphysema of lung ([place]) 06/27/2016  . Thoracic aortic aneurysm without rupture ([place]) 06/27/2016  . DNR (do not resuscitate) 01/23/2014  . Advanced care planning/counseling discussion 12/12/2013  . Medicare annual wellness visit, subsequent 05/31/2012  . Thrombocytopenia, unspecified  ([place]) 05/24/2012  . Prediabetes 05/26/2011  . History of TIAs   . CAD (coronary artery disease)   . Osteoporosis   . HLD (hyperlipidemia)   . HTN (hypertension)   . Status post thoracic aortic aneurysm repair   . CHF (congestive heart failure) ([place])   . Smoker     Past Surgical History:  Procedure Laterality Date  . AORTA - SUPERIOR MESENTERIC ARTERY BYPASS GRAFT    . APPENDECTOMY  1950  . CT SCAN  07/2005   patent TAA graft, small endoleak, enlarging aneurysm.  patent SMA/celiac artery graft.  stable dissection of L iliac arteries, emphysema, , osteopenia, compression fx T11, L5, refused further CT scans  . DEXA  11/2010   -3.9 spine, -3.5 femur  . THORACIC AORTIC ANEURYSM REPAIR  01/11/2005   first open then endovascular graft; GORE TAG ref: [id number]; Lot: [id number] Sammuel Hines at [facility]  . TOTAL ABDOMINAL HYSTERECTOMY  1973   ovaries out.  reason: bleeding  . US ECHOCARDIOGRAPHY  03/8561   systolic and diastolic dysfunction, EF 14-97%, dilated L vent, CAD, degenerative mitral valve with regurg, aortic sclerosis, R vent dysfunction, pulm HTN  . VEIN LIGATION AND STRIPPING      OB History    No data available       Home Medications    Prior to Admission medications   Medication Sig Start Date End Date Taking? Authorizing Provider  aspirin 81 MG tablet  Take 81 mg by mouth daily.     [provider]  atorvastatin (LIPITOR) 80 MG tablet TAKE 1 TABLET BY MOUTH EVERY DAY 02/24/16   Ria Bush, MD  Calcium Carb-Cholecalciferol (CALCIUM-VITAMIN D) 600-400 MG-UNIT TABS Take 1 tablet by mouth every other day.     [provider]  enalapril (VASOTEC) 10 MG tablet TAKE 1 TABLET BY MOUTH EVERY DAY 01/10/16   Ria Bush, MD  hydrochlorothiazide (MICROZIDE) 12.5 MG capsule TAKE 1 CAPSULE BY MOUTH EVERY DAY 02/24/16   Ria Bush, MD  metoprolol (LOPRESSOR) 50 MG tablet TAKE 1 TABLET BY MOUTH TWICE DAILY 03/20/16   Ria Bush, MD  Multiple Vitamin  (MULTIVITAMIN) tablet Take 1 tablet by mouth daily. Centrum Silver    [provider]    Family History Family History  Problem Relation Age of Onset  . Mental illness Mother        deceased suicide  . Cancer Neg Hx   . Diabetes Neg Hx   . Stroke Neg Hx   . Hypertension Neg Hx     Social History Social History  Substance Use Topics  . Smoking status: Current Every Day Smoker    Packs/day: 0.50    Years: 61.00    Types: Cigarettes  . Smokeless tobacco: Never Used     Comment: 1/2 PPD usually, but sometimes more  . Alcohol use No     Allergies   Fosamax [alendronate sodium]   Review of Systems Review of Systems  All other systems reviewed and are negative.    Physical Exam Updated Vital Signs BP 126/71   Pulse 100   Temp 98.3 F (36.8 C) (Oral)   Resp (!) 23   Ht 5\' 2"  (1.575 m)   Wt 53.5 kg (118 lb)   SpO2 100%   BMI 21.58 kg/m   Physical Exam  Constitutional: She is oriented to person, place, and time. She appears well-developed and well-nourished.  HENT:  Head: Normocephalic and atraumatic.  Cardiovascular: Regular rhythm.   No murmur heard. Pulmonary/Chest: Effort normal. No respiratory distress.  Tachypnea with rhonchi in all lung fields  Abdominal: Soft. There is no tenderness. There is no rebound and no guarding.  Musculoskeletal: She exhibits no edema or tenderness.  Neurological: She is alert and oriented to person, place, and time.  Skin: Skin is warm and dry. There is pallor.  Psychiatric: She has a normal mood and affect. Her behavior is normal.  Nursing note and vitals reviewed.    ED Treatments / Results  Labs (all labs ordered are listed, but only abnormal results are displayed) Labs Reviewed  COMPREHENSIVE METABOLIC PANEL  CBC WITH DIFFERENTIAL/PLATELET  BRAIN NATRIURETIC PEPTIDE  I-STAT CHEM 8, ED  I-STAT TROPOININ, ED    EKG  EKG Interpretation None       Radiology No results  found.  Procedures Procedures (including critical care time)  Medications Ordered in ED Medications - No data to display   Initial Impression / Assessment and Plan / ED Course  I have reviewed the triage vital signs and the nursing notes.  Pertinent labs & imaging results that were available during my care of the patient were reviewed by me and considered in my medical decision making (see chart for details).     Pt with hx/o aortic aneurysm, CHF here for progressive SOB/orthopnea.  Lung exam with rhonchi diffusely.  Initial concern for COPD vs CHF.  Pt reports minimal response to nebulizer treatments but she appears clinically to  be improved with increased sputum production.  CXR with no overt evidence of pulmonary edema/CHF.  Plan to admit for further treatment.    Final Clinical Impressions(s) / ED Diagnoses   Final diagnoses:  None    New Prescriptions New Prescriptions   No medications on file     Quintella Reichert, MD 08/23/16 1032

## 2016-08-22 NOTE — ED Triage Notes (Signed)
Pt brought in by EMS due to having SOB. Pt endorses productive cough. Pt has wheezing in all fields. Pt has received 5mg  of albuterol. Pt a&ox4.

## 2016-08-22 NOTE — ED Notes (Signed)
Admitting Provider at bedside. 

## 2016-08-22 NOTE — H&P (Signed)
History and Physical    JEANICE DEMPSEY EQA:[id number] DOB: 1933-12-26 DOA: 08/22/2016   PCP: Ria Bush, MD   Patient coming from/Resides with: Private residence/alone  Chief Complaint: Progressive dyspnea on exertion and orthopnea  HPI: Maria Simon is a 83 y.o. female with medical history significant for hypertension, dyslipidemia, ?? CAD, known thoracic aortic aneurysm status post endograft repair in 9798, systolic heart failure with last echo 2011 EF 10%, ongoing tobacco abuse and clinical diagnosis of mild emphysema. Patient reports over the past 1 week she has noticed worsening of her dyspnea on exertion and in the past 24 hours has developed new orthopnea. Over the past week she has also had productive cough of clear sputum not improved with her usual Mucinex. She is not had any fevers or chills or sick contacts. She does not occur weights daily. She thought her hands may be swollen more than usual. She also reports over the past 6 months decreased exercise tolerance noting that she now becomes quite short of breath with climbing the stairs and other slightly prolonged activity. Because of dyspnea on exertion she has limited her activities alone and with her family.  ED Course:  Vital Signs: BP (!) 117/55 (BP Location: Right Arm)   Pulse (!) 114   Temp 98.3 F (36.8 C) (Oral)   Resp 20   Ht 5\' 2"  (1.575 m)   Wt 53.5 kg (118 lb)   SpO2 97%   BMI 21.58 kg/m  PCXR: No edema or consolidation-stent graft traversing a descending thoracic aortic aneurysm which is also stable Lab data: Sodium 141, potassium 3.5, chloride 100, CO2 30, glucose 132, BUN 10, creatinine 0.82, calcium 10, anion gap 11, LFTs normal, BNP 188, poc troponin 0.00, white count 5400 with normal differential, hemoglobin 13.6, platelets 137,000  Medications and treatments: Albuterol nebulizer 5 mg 1  Review of Systems:  In addition to the HPI above,  No Fever-chills, myalgias or other constitutional  symptoms No Headache, changes with Vision or hearing, new weakness, tingling, numbness in any extremity, dizziness, dysarthria or word finding difficulty, gait disturbance or imbalance, tremors or seizure activity No problems swallowing food or Liquids, indigestion/reflux, choking or coughing while eating, abdominal pain with or after eating No Chest pain, palpitations No Abdominal pain, N/V, melena,hematochezia, dark tarry stools, constipation No dysuria, malodorous urine, hematuria or flank pain No new skin rashes, lesions, masses or bruises, No new joint pains, aches, swelling or redness No recent unintentional weight gain or loss No polyuria, polydypsia or polyphagia   Past Medical History:  Diagnosis Date  . CAD (coronary artery disease)    class 1-2 angina  . History of chicken pox   . History of TIAs 1998  . HLD (hyperlipidemia)   . HTN (hypertension)   . Osteoporosis    h/o compression fx T11, L5 by CT scan, Dexa 11/2010 -3.9 spine, -3.5 femur  . PAD (peripheral artery disease) ([place])   . Prediabetes 05/26/2011  . Smoker    ? COPD  . Status post thoracic aortic aneurysm repair 2006   graft in place, refused CT f/u in past.  . Systolic and diastolic CHF, chronic ([place])    last ultrasound prior to 2011, EF 10-15%, dilated likely nonischemic CM with CHF, will need to [place] with cards    Past Surgical History:  Procedure Laterality Date  . AORTA - SUPERIOR MESENTERIC ARTERY BYPASS GRAFT    . APPENDECTOMY  1950  . CT SCAN  07/2005   patent TAA  graft, small endoleak, enlarging aneurysm.  patent SMA/celiac artery graft.  stable dissection of L iliac arteries, emphysema, , osteopenia, compression fx T11, L5, refused further CT scans  . DEXA  11/2010   -3.9 spine, -3.5 femur  . THORACIC AORTIC ANEURYSM REPAIR  01/11/2005   first open then endovascular graft; GORE TAG ref: [id number]; Lot: [id number] Sammuel Hines at [facility])  . TOTAL ABDOMINAL HYSTERECTOMY  1973   ovaries out.  reason:  bleeding  . US ECHOCARDIOGRAPHY  09/1060   systolic and diastolic dysfunction, EF 69-48%, dilated L vent, CAD, degenerative mitral valve with regurg, aortic sclerosis, R vent dysfunction, pulm HTN  . VEIN LIGATION AND STRIPPING      Social History   Social History  . Marital status: Widowed    Spouse name: N/A  . Number of children: 8  . Years of education: 10th grade   Occupational History  . retired Other    used to work at [employer]   Social History Main Topics  . Smoking status: Current Every Day Smoker    Packs/day: 0.50    Years: 61.00    Types: Cigarettes  . Smokeless tobacco: Never Used     Comment: 1/2 PPD usually, but sometimes more  . Alcohol use No  . Drug use: No  . Sexual activity: No   Other Topics Concern  . Not on file   Social History Narrative   Caffeine: 2-3 coffee cups/day   Lives alone, no pets.   Daughter ([name]) nearby, granddaughter nearby   Edu: 10th grade   Activity: yardwork   Diet: some water, fruits/vegetables daily    Mobility: Independent Work history: Not obtained   Allergies  Allergen Reactions  . Fosamax [Alendronate Sodium] Other (See Comments)    Groin pain    Family History  Problem Relation Age of Onset  . Mental illness Mother        deceased suicide  . Cancer Neg Hx   . Diabetes Neg Hx   . Stroke Neg Hx   . Hypertension Neg Hx      Prior to Admission medications   Medication Sig Start Date End Date Taking? Authorizing Provider  aspirin 81 MG tablet Take 81 mg by mouth daily.    Yes [provider]  atorvastatin (LIPITOR) 80 MG tablet TAKE 1 TABLET BY MOUTH EVERY DAY 02/24/16  Yes Ria Bush, MD  Calcium Carb-Cholecalciferol (CALCIUM-VITAMIN D) 600-400 MG-UNIT TABS Take 1 tablet by mouth every other day.    Yes [provider]  enalapril (VASOTEC) 10 MG tablet TAKE 1 TABLET BY MOUTH EVERY DAY 01/10/16  Yes Ria Bush, MD  hydrochlorothiazide (MICROZIDE) 12.5 MG capsule TAKE 1  CAPSULE BY MOUTH EVERY DAY 02/24/16  Yes Ria Bush, MD  metoprolol (LOPRESSOR) 50 MG tablet TAKE 1 TABLET BY MOUTH TWICE DAILY 03/20/16  Yes Ria Bush, MD  Multiple Vitamin (MULTIVITAMIN) tablet Take 1 tablet by mouth daily. Centrum Silver   Yes [provider]    Physical Exam: Vitals:   08/22/16 1145 08/22/16 1153 08/22/16 1204 08/22/16 1205  BP: 112/76 112/76 (!) 105/54 (!) 117/55  Pulse: (!) 107 (!) 114    Resp: (!) 27 20    Temp:      TempSrc:      SpO2: 98% 97%    Weight:      Height:          Constitutional: NAD, calm, comfortable Eyes: PERRL, lids and conjunctivae normal ENMT: Mucous membranes are moist. Posterior  pharynx clear of any exudate or lesions.Normal dentition.  Neck: normal, supple, no masses, no thyromegaly Respiratory: Lung sounds diminished throughout posteriorly, a few crackles on inspiration with scattered expiratory wheezing, slightly tachypneic with subtle increased work of breathing, nasal cannula oxygen Cardiovascular: Regular but tachycardic rate and rhythm, no murmurs / rubs / gallops. No extremity edema. 2+ pedal pulses. No carotid bruits.  Abdomen: no tenderness, no masses palpated. No hepatosplenomegaly. Bowel sounds positive.  Musculoskeletal: no clubbing / cyanosis. No joint deformity upper and lower extremities. Good ROM, no contractures. Normal muscle tone.  Skin: no rashes, lesions, ulcers. No induration Neurologic: CN 2-12 grossly intact. Sensation intact, DTR normal. Strength 5/5 x all 4 extremities.  Psychiatric: Normal judgment and insight. Alert and oriented x 3. Normal mood.    Labs on Admission: I have personally reviewed following labs and imaging studies  CBC:  Recent Labs Lab 08/22/16 0948 08/22/16 0959  WBC 5.4  --   NEUTROABS 3.4  --   HGB 13.6 14.6  HCT 42.9 43.0  MCV 84.8  --   PLT 137*  --    Basic Metabolic Panel:  Recent Labs Lab 08/22/16 0948 08/22/16 0959  NA 141 140  K 3.5 3.4*    CL 100* 98*  CO2 30  --   GLUCOSE 132* 132*  BUN 10 13  CREATININE 0.82 0.70  CALCIUM 10.0  --    GFR: Estimated Creatinine Clearance: 44.4 mL/min (by C-G formula based on SCr of 0.7 mg/dL). Liver Function Tests:  Recent Labs Lab 08/22/16 0948  AST 33  ALT 19  ALKPHOS 75  BILITOT 0.9  PROT 6.8  ALBUMIN 3.6   No results for input(s): LIPASE, AMYLASE in the last 168 hours. No results for input(s): AMMONIA in the last 168 hours. Coagulation Profile: No results for input(s): INR, PROTIME in the last 168 hours. Cardiac Enzymes: No results for input(s): CKTOTAL, CKMB, CKMBINDEX, TROPONINI in the last 168 hours. BNP (last 3 results) No results for input(s): PROBNP in the last 8760 hours. HbA1C: No results for input(s): HGBA1C in the last 72 hours. CBG: No results for input(s): GLUCAP in the last 168 hours. Lipid Profile: No results for input(s): CHOL, HDL, LDLCALC, TRIG, CHOLHDL, LDLDIRECT in the last 72 hours. Thyroid Function Tests: No results for input(s): TSH, T4TOTAL, FREET4, T3FREE, THYROIDAB in the last 72 hours. Anemia Panel: No results for input(s): VITAMINB12, FOLATE, FERRITIN, TIBC, IRON, RETICCTPCT in the last 72 hours. Urine analysis: No results found for: COLORURINE, APPEARANCEUR, LABSPEC, PHURINE, GLUCOSEU, HGBUR, BILIRUBINUR, KETONESUR, PROTEINUR, UROBILINOGEN, NITRITE, LEUKOCYTESUR Sepsis Labs: @LABRCNTIP (procalcitonin:4,lacticidven:4) )No results found for this or any previous visit (from the past 240 hour(s)).   Radiological Exams on Admission: Dg Chest Port 1 View  Result Date: 08/22/2016 CLINICAL DATA:  Shortness of breath and cough for 1 week EXAM: PORTABLE CHEST 1 VIEW COMPARISON:  Chest radiograph May 31, 2012 and chest CT June 27, 2012 FINDINGS: There is no edema or consolidation. Heart is mildly enlarged with pulmonary vascularity within normal limits. There is a stent graft traversing a descending thoracic aortic aneurysm, a finding present  previously. No adenopathy. No focal bone lesions. IMPRESSION: Stent graft traversing a descending thoracic aortic aneurysm, a finding seen previously. No edema or consolidation. Stable cardiac prominence. Electronically Signed   By: Lowella Grip III M.D.   On: 08/22/2016 10:16    EKG: (Independently reviewed) sinus rhythm/sinus tachycardia ventricular rate 97 bpm, QTC 450 ms, nonspecific ST segment changes in the inferior leads with  subtle downsloping and T-wave inversion in leads V4 through V6 which are new  Assessment/Plan Principal Problem:   Acute respiratory failure with hypoxia  -Patient presents with dyspnea on exertion and orthopnea times one week with documented hypoxemia O2 sats 91% with activity -Likely multi-factorial etiology: COPD exacerbation vs bronchitis vs CHF exacerbation vs ischemic equivalent -Treat underlying causes -Continue supportive care: Oxygen, nebs  Active Problems:   Acute on chronic systolic heart failure -Patient with history of CHF with EF 10% based on 2011 echocardiogram (found under chart review/history) -No recent echo in system therefore repeat this admission -BNP only mildly elevated -Lasix 40 mg IV 1 dose (was on thiazide diuretic prior to admission) -Continue ACE inhibitor and beta blocker -Daily weights, strict I/O -Dyspnea on exertion and exercise intolerance could be reflective of  pulmonary conditions or deconditioning    COPD with acute bronchitis  -Patient has been told that she has mild COPD and has history of remote bronchitis -Has not undergone formal PFT testing -Current chest x-ray has pneumonitis-like pattern -Respiratory viral panel -Procalcitonin -Treat as pneumonia-begin empiric Levaquin with dosing per pharmacy based on patient's advanced age -Blood cultures and sputum culture -Diminished lung sounds throughout so Solu-Medrol 40 mg IV daily -Xopenex nebs every 8 hours in setting of tachycardia    History of endograft  repair of thoracic aortic aneurysm (2005) -Underwent imaging of graft via CTA in April which demonstrated stability of graft -Dyspnea can be a symptom of endovascular leak so we'll repeat CTA now    Abnormal EKG/?? CAD -Patient presents with nonspecific inferior lead findings with subtle ST segment downsloping and T-wave inversion in V4 through 6 -No reports of chest pain -Cycle troponin -Follow-up on echo -Continue aspirin, statin and beta blocker    Hypertension -Current blood pressure well controlled on preadmission beta blocker and ACE inhibitor    Dyslipidemia -Continue statin    Acute hypokalemia -Borderline and with single dose of Lasix will give 40 mEq potassium now    Thrombocytopenia  -Platelets greater than 100,000 -Patient has history of transient thrombocytopenia previously      DVT prophylaxis: Lovenox Code Status: DO NOT RESUSCITATE  Family Communication: Daughter and other family at bedside  Disposition Plan: Home Consults called: None    Destenee Guerry L. ANP-BC [facility] Pager [phone]   If 7PM-7AM, please contact night-coverage [email or web address] Password [email or web address]  08/22/2016, 12:11 PM

## 2016-08-23 ENCOUNTER — Observation Stay (HOSPITAL_COMMUNITY): Payer: Medicare Other

## 2016-08-23 ENCOUNTER — Observation Stay (HOSPITAL_BASED_OUTPATIENT_CLINIC_OR_DEPARTMENT_OTHER): Payer: Medicare Other

## 2016-08-23 DIAGNOSIS — R0602 Shortness of breath: Secondary | ICD-10-CM | POA: Diagnosis not present

## 2016-08-23 DIAGNOSIS — J9601 Acute respiratory failure with hypoxia: Secondary | ICD-10-CM | POA: Diagnosis not present

## 2016-08-23 DIAGNOSIS — J440 Chronic obstructive pulmonary disease with acute lower respiratory infection: Secondary | ICD-10-CM | POA: Diagnosis not present

## 2016-08-23 DIAGNOSIS — I5043 Acute on chronic combined systolic (congestive) and diastolic (congestive) heart failure: Secondary | ICD-10-CM | POA: Diagnosis not present

## 2016-08-23 DIAGNOSIS — E876 Hypokalemia: Secondary | ICD-10-CM | POA: Diagnosis not present

## 2016-08-23 DIAGNOSIS — R0600 Dyspnea, unspecified: Secondary | ICD-10-CM | POA: Diagnosis not present

## 2016-08-23 DIAGNOSIS — N2889 Other specified disorders of kidney and ureter: Secondary | ICD-10-CM | POA: Diagnosis not present

## 2016-08-23 DIAGNOSIS — R9431 Abnormal electrocardiogram [ECG] [EKG]: Secondary | ICD-10-CM | POA: Diagnosis not present

## 2016-08-23 DIAGNOSIS — I360 Nonrheumatic tricuspid (valve) stenosis: Secondary | ICD-10-CM

## 2016-08-23 DIAGNOSIS — B349 Viral infection, unspecified: Secondary | ICD-10-CM | POA: Diagnosis present

## 2016-08-23 DIAGNOSIS — I252 Old myocardial infarction: Secondary | ICD-10-CM | POA: Diagnosis not present

## 2016-08-23 DIAGNOSIS — E785 Hyperlipidemia, unspecified: Secondary | ICD-10-CM | POA: Diagnosis not present

## 2016-08-23 DIAGNOSIS — Z8673 Personal history of transient ischemic attack (TIA), and cerebral infarction without residual deficits: Secondary | ICD-10-CM | POA: Diagnosis not present

## 2016-08-23 DIAGNOSIS — D696 Thrombocytopenia, unspecified: Secondary | ICD-10-CM | POA: Diagnosis not present

## 2016-08-23 DIAGNOSIS — B348 Other viral infections of unspecified site: Secondary | ICD-10-CM | POA: Diagnosis not present

## 2016-08-23 DIAGNOSIS — I2510 Atherosclerotic heart disease of native coronary artery without angina pectoris: Secondary | ICD-10-CM | POA: Diagnosis present

## 2016-08-23 DIAGNOSIS — R0601 Orthopnea: Secondary | ICD-10-CM | POA: Diagnosis not present

## 2016-08-23 DIAGNOSIS — I110 Hypertensive heart disease with heart failure: Secondary | ICD-10-CM | POA: Diagnosis not present

## 2016-08-23 DIAGNOSIS — J209 Acute bronchitis, unspecified: Secondary | ICD-10-CM | POA: Diagnosis not present

## 2016-08-23 DIAGNOSIS — I5023 Acute on chronic systolic (congestive) heart failure: Secondary | ICD-10-CM | POA: Diagnosis not present

## 2016-08-23 DIAGNOSIS — Z66 Do not resuscitate: Secondary | ICD-10-CM | POA: Diagnosis not present

## 2016-08-23 DIAGNOSIS — F17210 Nicotine dependence, cigarettes, uncomplicated: Secondary | ICD-10-CM | POA: Diagnosis not present

## 2016-08-23 DIAGNOSIS — R05 Cough: Secondary | ICD-10-CM | POA: Diagnosis not present

## 2016-08-23 DIAGNOSIS — J441 Chronic obstructive pulmonary disease with (acute) exacerbation: Secondary | ICD-10-CM | POA: Diagnosis not present

## 2016-08-23 LAB — BASIC METABOLIC PANEL
Anion gap: 8 (ref 5–15)
BUN: 16 mg/dL (ref 6–20)
CALCIUM: 10.2 mg/dL (ref 8.9–10.3)
CO2: 29 mmol/L (ref 22–32)
CREATININE: 1.04 mg/dL — AB (ref 0.44–1.00)
Chloride: 100 mmol/L — ABNORMAL LOW (ref 101–111)
GFR calc Af Amer: 57 mL/min — ABNORMAL LOW (ref >60)
GFR, EST NON AFRICAN AMERICAN: 49 mL/min — AB (ref >60)
GLUCOSE: 150 mg/dL — AB (ref 65–99)
Potassium: 4.2 mmol/L (ref 3.5–5.1)
SODIUM: 137 mmol/L (ref 135–145)

## 2016-08-23 LAB — TROPONIN I
TROPONIN I: 0.09 ng/mL — AB (ref <0.03)
Troponin I: 0.06 ng/mL (ref <0.03)
Troponin I: 0.09 ng/mL (ref <0.03)

## 2016-08-23 LAB — ECHOCARDIOGRAM COMPLETE
HEIGHTINCHES: 62 in
Weight: 1819.2 oz

## 2016-08-23 LAB — HIV ANTIBODY (ROUTINE TESTING W REFLEX): HIV SCREEN 4TH GENERATION: NONREACTIVE

## 2016-08-23 MED ORDER — BISACODYL 5 MG PO TBEC: 5.0000 mg | DELAYED_RELEASE_TABLET | Freq: Every day | ORAL | Status: DC | PRN

## 2016-08-23 MED ORDER — BISACODYL 10 MG RE SUPP: 10.0000 mg | Freq: Every day | RECTAL | Status: DC | PRN

## 2016-08-23 NOTE — Progress Notes (Signed)
Pt has an elevated troponin of 0.06. BP 97/50, HR 96, NSR on Telemetry and 96% on 2L O2 [place]. Pt has had small dose of Ativan and is asymptomatic with no signs of distress. Family at bedside. Will continue to monitor.

## 2016-08-23 NOTE — Progress Notes (Signed)
  Echocardiogram 2D Echocardiogram has been performed.  Maria Simon 08/23/2016, 10:19 AM

## 2016-08-23 NOTE — Progress Notes (Signed)
PROGRESS NOTE    Maria Simon  BPZ:[id number] DOB: [birth year] DOA: 08/22/2016 PCP: Ria Bush, MD   Outpatient Specialists:     Brief Narrative:  Maria Simon is a 83 y.o. female who presents with acute respiratory failure likely multifactorial with predominance of symptoms due to COPD exacerbation with undiagnosed underlying COPD. On CT Scan, An incidental finding of potential clinical significance has been found. Borderline enhancement of an intermediate attenuation lesion arising exophytic from the upper pole of the left kidney. This could represent a low grade renal neoplasm such as papillary carcinoma. Recommend further evaluation with contrasted MRI of the abdomen.      Assessment & Plan:   Principal Problem:   Acute respiratory failure with hypoxia (HCC) Active Problems:   Acute on chronic systolic heart failure (HCC)   COPD with acute bronchitis (HCC)   Abnormal EKG   Hypertension   History of endograft repair of thoracic aortic aneurysm (2005)   Dyslipidemia   Acute hypokalemia   Thrombocytopenia (HCC)   Tobacco abuse    Acute respiratory failure with hypoxia  --likely due toCOPD exacerbation due to rhinovirus -wean as tolerated -echo: Left ventricle: The cavity size was normal. Systolic function was   normal. The estimated ejection fraction was in the range of 60%   to 65%. Wall motion was normal; there were no regional wall   motion abnormalities. Doppler parameters are consistent with   abnormal left ventricular relaxation (grade 1 diastolic   dysfunction).  Mild elevation in troponin -trend -?due to COPD exacerbation -no chest pain- may need cardiology eval -echo done and above    COPD with acute bronchitis due to rhinovirus -needs formal PFT testing -steroids/abx -Xopenex nebs every 8 hours in setting of tachycardia -encourage smoking cessation    History of endograft repair of thoracic aortic aneurysm (2005) -Underwent imaging  of graft via CTA in April which demonstrated stability of graft -CTA done with incidental finding that will need outpatient MRI done to clarify    Hypertension -holding parameters as BP low    Dyslipidemia -Continue statin    Acute hypokalemia -repleted    Thrombocytopenia  -Platelets greater than 100,000 -Patient has history of transient thrombocytopenia previously      DVT prophylaxis:  Lovenox   Code Status: DNR   Family Communication: At bedside  Disposition Plan:     Consultants:      Subjective: Breathing better-- able to get to bedside commode with little SOB  Objective: Vitals:   08/23/16 0632 08/23/16 0750 08/23/16 0917 08/23/16 1248  BP: (!) 100/51  (!) 80/48 (!) 98/52  Pulse: 99   (!) 101  Resp: 20   18  Temp: 97.7 F (36.5 C)   97.7 F (36.5 C)  TempSrc: Oral   Oral  SpO2: 97% 96%  98%  Weight: 51.6 kg (113 lb 11.2 oz)     Height:        Intake/Output Summary (Last 24 hours) at 08/23/16 1329 Last data filed at 08/23/16 0927  Gross per 24 hour  Intake              480 ml  Output              675 ml  Net             -195 ml   Filed Weights   08/22/16 0931 08/22/16 1429 08/23/16 0632  Weight: 53.5 kg (118 lb) 51.7 kg (114 lb) 51.6 kg (113 lb 11.2  oz)    Examination:  General exam: Appears calm and comfortable  Respiratory system: rhales throughout, mild expiratory wheezing Cardiovascular system: S1 & S2 heard, RRR. No JVD, murmurs, rubs, gallops or clicks. No pedal edema. Gastrointestinal system: Abdomen is nondistended, soft and nontender. No organomegaly or masses felt. Normal bowel sounds heard. Central nervous system: Alert and oriented. No focal neurological deficits. Extremities: Symmetric 5 x 5 power. Skin: No rashes, lesions or ulcers Psychiatry: Judgement and insight appear normal. Mood & affect appropriate.     Data Reviewed: I have personally reviewed following labs and imaging studies  CBC:  Recent  Labs Lab 08/22/16 0948 08/22/16 0959  WBC 5.4  --   NEUTROABS 3.4  --   HGB 13.6 14.6  HCT 42.9 43.0  MCV 84.8  --   PLT 137*  --    Basic Metabolic Panel:  Recent Labs Lab 08/22/16 0948 08/22/16 0959 08/23/16 0216  NA 141 140 137  K 3.5 3.4* 4.2  CL 100* 98* 100*  CO2 30  --  29  GLUCOSE 132* 132* 150*  BUN 10 13 16   CREATININE 0.82 0.70 1.04*  CALCIUM 10.0  --  10.2   GFR: Estimated Creatinine Clearance: 34.1 mL/min (A) (by C-G formula based on SCr of 1.04 mg/dL (H)). Liver Function Tests:  Recent Labs Lab 08/22/16 0948  AST 33  ALT 19  ALKPHOS 75  BILITOT 0.9  PROT 6.8  ALBUMIN 3.6   No results for input(s): LIPASE, AMYLASE in the last 168 hours. No results for input(s): AMMONIA in the last 168 hours. Coagulation Profile: No results for input(s): INR, PROTIME in the last 168 hours. Cardiac Enzymes:  Recent Labs Lab 08/22/16 1215 08/22/16 1805 08/22/16 2320 08/23/16 0814  TROPONINI <0.03 <0.03 0.06* 0.09*   BNP (last 3 results) No results for input(s): PROBNP in the last 8760 hours. HbA1C: No results for input(s): HGBA1C in the last 72 hours. CBG: No results for input(s): GLUCAP in the last 168 hours. Lipid Profile: No results for input(s): CHOL, HDL, LDLCALC, TRIG, CHOLHDL, LDLDIRECT in the last 72 hours. Thyroid Function Tests: No results for input(s): TSH, T4TOTAL, FREET4, T3FREE, THYROIDAB in the last 72 hours. Anemia Panel: No results for input(s): VITAMINB12, FOLATE, FERRITIN, TIBC, IRON, RETICCTPCT in the last 72 hours. Urine analysis: No results found for: COLORURINE, APPEARANCEUR, LABSPEC, PHURINE, GLUCOSEU, HGBUR, BILIRUBINUR, KETONESUR, PROTEINUR, UROBILINOGEN, NITRITE, LEUKOCYTESUR Sepsis Labs: @LABRCNTIP (procalcitonin:4,lacticidven:4)  ) Recent Results (from the past 240 hour(s))  Respiratory Panel by PCR     Status: Abnormal   Collection Time: 08/22/16  2:52 PM  Result Value Ref Range Status   Adenovirus NOT DETECTED NOT  DETECTED Final   Coronavirus 229E NOT DETECTED NOT DETECTED Final   Coronavirus HKU1 NOT DETECTED NOT DETECTED Final   Coronavirus NL63 NOT DETECTED NOT DETECTED Final   Coronavirus OC43 NOT DETECTED NOT DETECTED Final   Metapneumovirus NOT DETECTED NOT DETECTED Final   Rhinovirus / Enterovirus DETECTED (A) NOT DETECTED Final   Influenza A NOT DETECTED NOT DETECTED Final   Influenza B NOT DETECTED NOT DETECTED Final   Parainfluenza Virus 1 NOT DETECTED NOT DETECTED Final   Parainfluenza Virus 2 NOT DETECTED NOT DETECTED Final   Parainfluenza Virus 3 NOT DETECTED NOT DETECTED Final   Parainfluenza Virus 4 NOT DETECTED NOT DETECTED Final   Respiratory Syncytial Virus NOT DETECTED NOT DETECTED Final   Bordetella pertussis NOT DETECTED NOT DETECTED Final   Chlamydophila pneumoniae NOT DETECTED NOT DETECTED Final  Mycoplasma pneumoniae NOT DETECTED NOT DETECTED Final      Anti-infectives    Start     Dose/Rate Route Frequency Ordered Stop   08/22/16 1200  levofloxacin (LEVAQUIN) IVPB 750 mg     750 mg 100 mL/hr over 90 Minutes Intravenous Every 48 hours 08/22/16 1129 08/28/16 1159       Radiology Studies: Dg Chest 2 View  Result Date: 08/23/2016 CLINICAL DATA:  Cough.  Shortness of breath.  Hypoxia. EXAM: CHEST  2 VIEW COMPARISON:  Chest x-ray 08/22/2016.  Chest CT 08/22/2016 . FINDINGS: Thoracic aortic aneurysm with aortic stent graft again noted. Heart size normal. Mild basilar subsegmental atelectasis. No pleural effusion or pneumothorax. Surgical clips upper abdomen. IMPRESSION: 1. Aortic aneurysm with aortic stent graft again noted. No significant change in appearance. 2. Low lung volumes with mild basilar atelectasis. Electronically Signed   By: Marcello Moores  Register   On: 08/23/2016 12:33   Dg Chest Port 1 View  Result Date: 08/22/2016 CLINICAL DATA:  Shortness of breath and cough for 1 week EXAM: PORTABLE CHEST 1 VIEW COMPARISON:  Chest radiograph May 31, 2012 and chest CT June 27, 2012 FINDINGS: There is no edema or consolidation. Heart is mildly enlarged with pulmonary vascularity within normal limits. There is a stent graft traversing a descending thoracic aortic aneurysm, a finding present previously. No adenopathy. No focal bone lesions. IMPRESSION: Stent graft traversing a descending thoracic aortic aneurysm, a finding seen previously. No edema or consolidation. Stable cardiac prominence. Electronically Signed   By: Lowella Grip III M.D.   On: 08/22/2016 10:16   Ct Angio Chest Aorta W/cm &/or Wo/cm  Result Date: 08/22/2016 CLINICAL DATA:  83 year old female with new onset dyspnea on exertion and orthopnea. Known history of thoracic aortic aneurysm status post TEVAR reportedly in 2005. EXAM: CT ANGIOGRAPHY CHEST WITH CONTRAST TECHNIQUE: Multidetector CT imaging of the chest was performed using the standard protocol during bolus administration of intravenous contrast. Multiplanar CT image reconstructions and MIPs were obtained to evaluate the vascular anatomy. CONTRAST:  80 mL Isovue-300 COMPARISON:  Prior CT scan of the chest 06/27/2016 FINDINGS: Cardiovascular: Aneurysmal dilatation of the aortic arch measures 3.9 cm which is insignificantly changed compared to 3.8 cm previously. Surgical changes of prior thoracic aortic aneurysm repair using nest did core endo prostheses extending from the distal aortic infundibulum throughout the visualized descending thoracic aorta and into the super renal abdominal aorta. No evidence unchanging configuration of the endograft system. The excluded aneurysm sac measures 10.3 x 6.8 cm in diameter, unchanged compared to prior. The super renal abdominal aorta is irregular in aneurysmal with a maximal diameter of 3.8 cm which is unchanged compared to prior. Of note, the distal most landing zone is not completely opposed to this irregular aneurysmal segment, however there is sufficient seal proximal to this that a type 1B endoleak is highly  unlikely. The origin of the mesenteric vessels are ligated. Incompletely imaged evidence of surgical debranching with probable distal anastomosis. All visualized vessels are patent. Both renal arteries are patent. Calcifications are present along the aortic valve leaflets. There is left ventricular dilatation with marked thinning of the lateral wall consistent with prior lateral wall infarction. No pericardial effusion. The main and central pulmonary arteries are well visualized and well opacified. No evidence of pulmonary arterial enlargement or pulmonary embolus. Mediastinum/Nodes: Approximately 1 cm hypoechoic nodule within the right thyroid gland demonstrates no significant interval change compared to prior. New mediastinal adenopathy. The visualized thoracic esophagus is unremarkable. Lungs/Pleura:  Moderate centrilobular pulmonary emphysema. No suspicious pulmonary nodule or mass. No focal airspace consolidation, pleural effusion, pneumothorax or evidence of pulmonary edema. Upper Abdomen: 1.8 cm intermediate attenuation lesion arising exophytic from the upper pole of the left kidney demonstrates borderline enhancement between the non contrasted series and the arterial phase series. Musculoskeletal: No acute fracture or aggressive appearing lytic or blastic osseous lesion. Stable T10 compression fracture. Review of the MIP images confirms the above findings. IMPRESSION: 1. No acute abnormality within the chest to explain the patient's clinical symptoms. Specifically, no evidence of pulmonary embolus, acute aortic abnormality, pulmonary edema or pneumonia. 2. Moderately severe centrilobular pulmonary emphysema. 3. **An incidental finding of potential clinical significance has been found. Borderline enhancement of an intermediate attenuation lesion arising exophytic from the upper pole of the left kidney. This could represent a low grade renal neoplasm such as papillary carcinoma. Recommend further evaluation  with contrasted MRI of the abdomen.** 4. Stable changes of thoracic and upper abdominal aneurysm status post TEVAR without significant interval change compared to 06/27/2016. 5. Left ventricular dilatation with marked thinning of the lateral and inferolateral walls consistent with prior myocardial infarction. The thinned segment also bows outward slightly suggesting early post MI aneurysmal dilatation of the left ventricle. 6. Aortic valve calcifications. 7. Stable T10 compression fracture. Electronically Signed   By: Jacqulynn Cadet M.D.   On: 08/22/2016 13:56        Scheduled Meds: . aspirin  81 mg Oral Daily  . atorvastatin  80 mg Oral Daily  . calcium-vitamin D  1 tablet Oral Q breakfast  . enalapril  10 mg Oral Daily  . enoxaparin (LOVENOX) injection  40 mg Subcutaneous Q24H  . levalbuterol  0.63 mg Nebulization TID  . metoprolol tartrate  50 mg Oral BID  . multivitamin with minerals  1 tablet Oral Daily  . predniSONE  40 mg Oral Q breakfast  . sodium chloride flush  3 mL Intravenous Q12H   Continuous Infusions: . sodium chloride    . levofloxacin (LEVAQUIN) IV 750 mg (08/22/16 1315)     LOS: 0 days    Time spent: 25 min    Gratz, DO [facility] Pager [phone]  If 7PM-7AM, please contact night-coverage [email or web address] Password [email or web address] 08/23/2016, 1:29 PM

## 2016-08-23 NOTE — Progress Notes (Signed)
Patient blood pressure 80/48 manually.  Patient asymptomatic.  Dr. Eliseo Squires made aware.  Will continue to monitor.

## 2016-08-24 DIAGNOSIS — B348 Other viral infections of unspecified site: Secondary | ICD-10-CM

## 2016-08-24 LAB — BASIC METABOLIC PANEL
ANION GAP: 7 (ref 5–15)
BUN: 23 mg/dL — ABNORMAL HIGH (ref 6–20)
CHLORIDE: 100 mmol/L — AB (ref 101–111)
CO2: 31 mmol/L (ref 22–32)
Calcium: 10 mg/dL (ref 8.9–10.3)
Creatinine, Ser: 0.94 mg/dL (ref 0.44–1.00)
GFR calc Af Amer: >60 mL/min (ref >60)
GFR calc non Af Amer: 56 mL/min — ABNORMAL LOW (ref >60)
Glucose, Bld: 123 mg/dL — ABNORMAL HIGH (ref 65–99)
POTASSIUM: 4.3 mmol/L (ref 3.5–5.1)
SODIUM: 138 mmol/L (ref 135–145)

## 2016-08-24 MED ORDER — LEVALBUTEROL HCL 0.63 MG/3ML IN NEBU
0.6300 mg | INHALATION_SOLUTION | Freq: Two times a day (BID) | RESPIRATORY_TRACT | Status: DC
  Administered 2016-08-24 – 2016-08-26 (×4): 0.63 mg via RESPIRATORY_TRACT
  Filled 2016-08-24 (×4): qty 3

## 2016-08-24 MED ORDER — IPRATROPIUM BROMIDE 0.02 % IN SOLN
0.5000 mg | Freq: Two times a day (BID) | RESPIRATORY_TRACT | Status: DC
  Administered 2016-08-24 – 2016-08-26 (×4): 0.5 mg via RESPIRATORY_TRACT
  Filled 2016-08-24 (×4): qty 2.5

## 2016-08-24 MED ORDER — GUAIFENESIN ER 600 MG PO TB12
600.0000 mg | ORAL_TABLET | Freq: Two times a day (BID) | ORAL | Status: DC
  Administered 2016-08-24 – 2016-08-28 (×10): 600 mg via ORAL
  Filled 2016-08-24 (×10): qty 1

## 2016-08-24 MED ORDER — IPRATROPIUM BROMIDE 0.02 % IN SOLN
0.5000 mg | Freq: Four times a day (QID) | RESPIRATORY_TRACT | Status: DC
  Administered 2016-08-24: 0.5 mg via RESPIRATORY_TRACT
  Filled 2016-08-24: qty 2.5

## 2016-08-24 NOTE — Progress Notes (Addendum)
PROGRESS NOTE    Maria Simon  [id number] DOB: 10-24-1933 DOA: 08/22/2016 PCP: Ria Bush, MD   Outpatient Specialists:     Brief Narrative:  Maria Simon is a 83 y.o. female who presents with acute respiratory failure likely multifactorial with predominance of symptoms due to COPD exacerbation with undiagnosed underlying COPD. On CT Scan, An incidental finding of potential clinical significance has been found. Borderline enhancement of an intermediate attenuation lesion arising exophytic from the upper pole of the left kidney. This could represent a low grade renal neoplasm such as papillary carcinoma. Recommend further evaluation with contrasted MRI of the abdomen. Working to wean off O2 before d/c-- will nee outpatient pulm follow up.      Assessment & Plan:   Principal Problem:   Acute respiratory failure with hypoxia (HCC) Active Problems:   Acute on chronic systolic heart failure (HCC)   COPD with acute bronchitis (HCC)   Abnormal EKG   Hypertension   History of endograft repair of thoracic aortic aneurysm (2005)   Dyslipidemia   Acute hypokalemia   Thrombocytopenia (HCC)   Tobacco abuse    Acute respiratory failure with hypoxia  --likely due to COPD exacerbation due to rhinovirus -wean O2 as tolerated -echo: Left ventricle: The cavity size was normal. Systolic function was   normal. The estimated ejection fraction was in the range of 60%   to 65%. Wall motion was normal; there were no regional wall   motion abnormalities. Doppler parameters are consistent with   abnormal left ventricular relaxation (grade 1 diastolic   dysfunction). Home O2 eval Does not appear to be volume overloaded  Mild elevation in troponin -trend- flat in nature -?due to COPD exacerbation -no chest pain -echo done and above    COPD with acute bronchitis due to rhinovirus -needs formal PFT testing -steroids/abx -nebs -encourage smoking cessation- has been  smoking since age 53    History of endograft repair of thoracic aortic aneurysm (2005) -Underwent imaging of graft via CTA in April which demonstrated stability of graft -CTA done with incidental finding that will need outpatient MRI done to clarify    Hypertension -holding parameters    Dyslipidemia -Continue statin    Acute hypokalemia -repleted    Thrombocytopenia  -Platelets greater than 100,000 -Patient has history of transient thrombocytopenia previously      DVT prophylaxis:  Lovenox   Code Status: DNR   Family Communication: At bedside 6/20 LM for daughter on 6/21  Disposition Plan:     Consultants:      Subjective: Able to cough up more sputum  Objective: Vitals:   08/23/16 2110 08/24/16 0640 08/24/16 0933 08/24/16 0953  BP: 108/65 119/63  123/65  Pulse: (!) 112 98  94  Resp: 18 20    Temp: 98.1 F (36.7 C) 98 F (36.7 C)    TempSrc: Oral Oral    SpO2: (!) 88% 97% 96%   Weight:  51.5 kg (113 lb 9.6 oz)    Height:        Intake/Output Summary (Last 24 hours) at 08/24/16 1132 Last data filed at 08/24/16 0955  Gross per 24 hour  Intake              663 ml  Output              201 ml  Net              462 ml   Filed Weights   08/22/16 1429  08/23/16 7673 08/24/16 0640  Weight: 51.7 kg (114 lb) 51.6 kg (113 lb 11.2 oz) 51.5 kg (113 lb 9.6 oz)    Examination:  General exam: In bed, flat affect  Respiratory system: coarse, with exp wheezing but moving more air Cardiovascular system: rrr, no LE edema Gastrointestinal system: +BS, soft Central nervous system: A+Ox3, NAD Extremities: moves all 4 ext Skin: few bruises     Data Reviewed: I have personally reviewed following labs and imaging studies  CBC:  Recent Labs Lab 08/22/16 0948 08/22/16 0959  WBC 5.4  --   NEUTROABS 3.4  --   HGB 13.6 14.6  HCT 42.9 43.0  MCV 84.8  --   PLT 137*  --    Basic Metabolic Panel:  Recent Labs Lab 08/22/16 0948 08/22/16 0959  08/23/16 0216 08/24/16 0315  NA 141 140 137 138  K 3.5 3.4* 4.2 4.3  CL 100* 98* 100* 100*  CO2 30  --  29 31  GLUCOSE 132* 132* 150* 123*  BUN 10 13 16  23*  CREATININE 0.82 0.70 1.04* 0.94  CALCIUM 10.0  --  10.2 10.0   GFR: Estimated Creatinine Clearance: 37.8 mL/min (by C-G formula based on SCr of 0.94 mg/dL). Liver Function Tests:  Recent Labs Lab 08/22/16 0948  AST 33  ALT 19  ALKPHOS 75  BILITOT 0.9  PROT 6.8  ALBUMIN 3.6   No results for input(s): LIPASE, AMYLASE in the last 168 hours. No results for input(s): AMMONIA in the last 168 hours. Coagulation Profile: No results for input(s): INR, PROTIME in the last 168 hours. Cardiac Enzymes:  Recent Labs Lab 08/22/16 1215 08/22/16 1805 08/22/16 2320 08/23/16 0814 08/23/16 1845  TROPONINI <0.03 <0.03 0.06* 0.09* 0.09*   BNP (last 3 results) No results for input(s): PROBNP in the last 8760 hours. HbA1C: No results for input(s): HGBA1C in the last 72 hours. CBG: No results for input(s): GLUCAP in the last 168 hours. Lipid Profile: No results for input(s): CHOL, HDL, LDLCALC, TRIG, CHOLHDL, LDLDIRECT in the last 72 hours. Thyroid Function Tests: No results for input(s): TSH, T4TOTAL, FREET4, T3FREE, THYROIDAB in the last 72 hours. Anemia Panel: No results for input(s): VITAMINB12, FOLATE, FERRITIN, TIBC, IRON, RETICCTPCT in the last 72 hours. Urine analysis: No results found for: COLORURINE, APPEARANCEUR, LABSPEC, PHURINE, GLUCOSEU, HGBUR, BILIRUBINUR, KETONESUR, PROTEINUR, UROBILINOGEN, NITRITE, LEUKOCYTESUR   ) Recent Results (from the past 240 hour(s))  Culture, blood (routine x 2) Call MD if unable to obtain prior to antibiotics being given     Status: None (Preliminary result)   Collection Time: 08/22/16 11:35 AM  Result Value Ref Range Status   Specimen Description BLOOD RIGHT FOREARM  Final   Special Requests   Final    BOTTLES DRAWN AEROBIC AND ANAEROBIC Blood Culture adequate volume   Culture  NO GROWTH 1 DAY  Final   Report Status PENDING  Incomplete  Culture, blood (routine x 2) Call MD if unable to obtain prior to antibiotics being given     Status: None (Preliminary result)   Collection Time: 08/22/16 11:46 AM  Result Value Ref Range Status   Specimen Description BLOOD RIGHT ARM  Final   Special Requests   Final    BOTTLES DRAWN AEROBIC AND ANAEROBIC Blood Culture adequate volume   Culture NO GROWTH 1 DAY  Final   Report Status PENDING  Incomplete  Respiratory Panel by PCR     Status: Abnormal   Collection Time: 08/22/16  2:52 PM  Result  Value Ref Range Status   Adenovirus NOT DETECTED NOT DETECTED Final   Coronavirus 229E NOT DETECTED NOT DETECTED Final   Coronavirus HKU1 NOT DETECTED NOT DETECTED Final   Coronavirus NL63 NOT DETECTED NOT DETECTED Final   Coronavirus OC43 NOT DETECTED NOT DETECTED Final   Metapneumovirus NOT DETECTED NOT DETECTED Final   Rhinovirus / Enterovirus DETECTED (A) NOT DETECTED Final   Influenza A NOT DETECTED NOT DETECTED Final   Influenza B NOT DETECTED NOT DETECTED Final   Parainfluenza Virus 1 NOT DETECTED NOT DETECTED Final   Parainfluenza Virus 2 NOT DETECTED NOT DETECTED Final   Parainfluenza Virus 3 NOT DETECTED NOT DETECTED Final   Parainfluenza Virus 4 NOT DETECTED NOT DETECTED Final   Respiratory Syncytial Virus NOT DETECTED NOT DETECTED Final   Bordetella pertussis NOT DETECTED NOT DETECTED Final   Chlamydophila pneumoniae NOT DETECTED NOT DETECTED Final   Mycoplasma pneumoniae NOT DETECTED NOT DETECTED Final      Anti-infectives    Start     Dose/Rate Route Frequency Ordered Stop   08/22/16 1200  levofloxacin (LEVAQUIN) IVPB 750 mg     750 mg 100 mL/hr over 90 Minutes Intravenous Every 48 hours 08/22/16 1129 08/28/16 1159       Radiology Studies: Dg Chest 2 View  Result Date: 08/23/2016 CLINICAL DATA:  Cough.  Shortness of breath.  Hypoxia. EXAM: CHEST  2 VIEW COMPARISON:  Chest x-ray 08/22/2016.  Chest CT 08/22/2016  . FINDINGS: Thoracic aortic aneurysm with aortic stent graft again noted. Heart size normal. Mild basilar subsegmental atelectasis. No pleural effusion or pneumothorax. Surgical clips upper abdomen. IMPRESSION: 1. Aortic aneurysm with aortic stent graft again noted. No significant change in appearance. 2. Low lung volumes with mild basilar atelectasis. Electronically Signed   By: Marcello Moores  Register   On: 08/23/2016 12:33   Ct Angio Chest Aorta W/cm &/or Wo/cm  Result Date: 08/22/2016 CLINICAL DATA:  83 year old female with new onset dyspnea on exertion and orthopnea. Known history of thoracic aortic aneurysm status post TEVAR reportedly in 2005. EXAM: CT ANGIOGRAPHY CHEST WITH CONTRAST TECHNIQUE: Multidetector CT imaging of the chest was performed using the standard protocol during bolus administration of intravenous contrast. Multiplanar CT image reconstructions and MIPs were obtained to evaluate the vascular anatomy. CONTRAST:  80 mL Isovue-300 COMPARISON:  Prior CT scan of the chest 06/27/2016 FINDINGS: Cardiovascular: Aneurysmal dilatation of the aortic arch measures 3.9 cm which is insignificantly changed compared to 3.8 cm previously. Surgical changes of prior thoracic aortic aneurysm repair using nest did core endo prostheses extending from the distal aortic infundibulum throughout the visualized descending thoracic aorta and into the super renal abdominal aorta. No evidence unchanging configuration of the endograft system. The excluded aneurysm sac measures 10.3 x 6.8 cm in diameter, unchanged compared to prior. The super renal abdominal aorta is irregular in aneurysmal with a maximal diameter of 3.8 cm which is unchanged compared to prior. Of note, the distal most landing zone is not completely opposed to this irregular aneurysmal segment, however there is sufficient seal proximal to this that a type 1B endoleak is highly unlikely. The origin of the mesenteric vessels are ligated. Incompletely imaged  evidence of surgical debranching with probable distal anastomosis. All visualized vessels are patent. Both renal arteries are patent. Calcifications are present along the aortic valve leaflets. There is left ventricular dilatation with marked thinning of the lateral wall consistent with prior lateral wall infarction. No pericardial effusion. The main and central pulmonary arteries are well visualized  and well opacified. No evidence of pulmonary arterial enlargement or pulmonary embolus. Mediastinum/Nodes: Approximately 1 cm hypoechoic nodule within the right thyroid gland demonstrates no significant interval change compared to prior. New mediastinal adenopathy. The visualized thoracic esophagus is unremarkable. Lungs/Pleura: Moderate centrilobular pulmonary emphysema. No suspicious pulmonary nodule or mass. No focal airspace consolidation, pleural effusion, pneumothorax or evidence of pulmonary edema. Upper Abdomen: 1.8 cm intermediate attenuation lesion arising exophytic from the upper pole of the left kidney demonstrates borderline enhancement between the non contrasted series and the arterial phase series. Musculoskeletal: No acute fracture or aggressive appearing lytic or blastic osseous lesion. Stable T10 compression fracture. Review of the MIP images confirms the above findings. IMPRESSION: 1. No acute abnormality within the chest to explain the patient's clinical symptoms. Specifically, no evidence of pulmonary embolus, acute aortic abnormality, pulmonary edema or pneumonia. 2. Moderately severe centrilobular pulmonary emphysema. 3. **An incidental finding of potential clinical significance has been found. Borderline enhancement of an intermediate attenuation lesion arising exophytic from the upper pole of the left kidney. This could represent a low grade renal neoplasm such as papillary carcinoma. Recommend further evaluation with contrasted MRI of the abdomen.** 4. Stable changes of thoracic and upper  abdominal aneurysm status post TEVAR without significant interval change compared to 06/27/2016. 5. Left ventricular dilatation with marked thinning of the lateral and inferolateral walls consistent with prior myocardial infarction. The thinned segment also bows outward slightly suggesting early post MI aneurysmal dilatation of the left ventricle. 6. Aortic valve calcifications. 7. Stable T10 compression fracture. Electronically Signed   By: Jacqulynn Cadet M.D.   On: 08/22/2016 13:56        Scheduled Meds: . aspirin  81 mg Oral Daily  . atorvastatin  80 mg Oral Daily  . calcium-vitamin D  1 tablet Oral Q breakfast  . enalapril  10 mg Oral Daily  . enoxaparin (LOVENOX) injection  40 mg Subcutaneous Q24H  . ipratropium  0.5 mg Nebulization Q6H  . levalbuterol  0.63 mg Nebulization TID  . metoprolol tartrate  50 mg Oral BID  . multivitamin with minerals  1 tablet Oral Daily  . predniSONE  40 mg Oral Q breakfast  . sodium chloride flush  3 mL Intravenous Q12H   Continuous Infusions: . sodium chloride    . levofloxacin (LEVAQUIN) IV 750 mg (08/22/16 1315)     LOS: 1 day    Time spent: 25 min    [place], DO [facility] Pager [phone]  If 7PM-7AM, please contact night-coverage [email or web address] Password [facility] 08/24/2016, 11:32 AM

## 2016-08-25 DIAGNOSIS — I5023 Acute on chronic systolic (congestive) heart failure: Secondary | ICD-10-CM

## 2016-08-25 DIAGNOSIS — E876 Hypokalemia: Secondary | ICD-10-CM

## 2016-08-25 DIAGNOSIS — J9601 Acute respiratory failure with hypoxia: Principal | ICD-10-CM

## 2016-08-25 DIAGNOSIS — J209 Acute bronchitis, unspecified: Secondary | ICD-10-CM

## 2016-08-25 DIAGNOSIS — J440 Chronic obstructive pulmonary disease with acute lower respiratory infection: Secondary | ICD-10-CM

## 2016-08-25 LAB — BASIC METABOLIC PANEL
Anion gap: 6 (ref 5–15)
BUN: 21 mg/dL — AB (ref 6–20)
CHLORIDE: 102 mmol/L (ref 101–111)
CO2: 31 mmol/L (ref 22–32)
Calcium: 9.8 mg/dL (ref 8.9–10.3)
Creatinine, Ser: 0.93 mg/dL (ref 0.44–1.00)
GFR calc non Af Amer: 57 mL/min — ABNORMAL LOW (ref >60)
Glucose, Bld: 99 mg/dL (ref 65–99)
Potassium: 4.1 mmol/L (ref 3.5–5.1)
Sodium: 139 mmol/L (ref 135–145)

## 2016-08-25 NOTE — Progress Notes (Signed)
   08/25/16 2152  Vitals  Temp 98.4 F (36.9 C)  Temp Source Oral  BP (!) 108/56  MAP (mmHg) 71  BP Location Left Arm  BP Method Automatic  Patient Position (if appropriate) Lying  Pulse Rate 91  Resp 18  Oxygen Therapy  SpO2 (!) 88 %  O2 Device Room Air  Pt's SpO2 is 88%  on room air, pt resting in bed comfortably. Placed on 1.5L oxygen via [place]. Pt's O2 sat up to 95%. Will continue to monitor.

## 2016-08-25 NOTE — Progress Notes (Signed)
Pt daughter at bedside, [name], requesting MD update. Provided MD with family phone number, [phone].   Kert Shackett Leory Plowman

## 2016-08-25 NOTE — Progress Notes (Signed)
Pt third ambulation of the day per MD, pt tolerated well, no assistive devices Ambulated 150 feet   SATURATION QUALIFICATIONS: (This note is used to comply with regulatory documentation for home oxygen)  Patient Saturations on Room Air at Rest = 95%  Patient Saturations on Room Air while Ambulating = 91%  Patient Saturations on 2 Liters of oxygen while Ambulating = 97%     Maria Simon

## 2016-08-25 NOTE — Care Management Note (Signed)
Case Management Note  Patient Details  Name: Maria Simon MRN: [id number] Date of Birth: Mar 18, 1933  Subjective/Objective:   Admitted with Acute Resp Failure                Action/Plan: Patient lives at home alone, supportive daughter Lynelle Smoke [phone]); PCP: Ria Bush, MD; has private insurance with Medicare with prescription drug coverage; pharmacy of choice is [facility]; patient could benefit from a Disease Management program for COPD/ Pneumonia; CM talked to her daughter as instructed by the patient; [name] chose [facility]; [place] with [facility] called for arrangements. Possibly need home 02 at discharge; CM will continue to follow for DCP.  Expected Discharge Date:      Possibly 08/28/2016            Expected Discharge Plan:  [place]  In-House Referral:   [facility]  Discharge planning Services  CM Consult    Choice offered to:  Patient, Adult Children  HH Arranged:  RN, Disease Management, PT [place] Agency:  [facility] (now [facility])  Status of Service:  In process, will continue to follow  Sherrilyn Rist [phone] 08/25/2016, 3:58 PM

## 2016-08-25 NOTE — Progress Notes (Signed)
PROGRESS NOTE    Maria Simon  ZWC:[id number] DOB: 1933-05-05 DOA: 08/22/2016  PCP: Ria Bush, MD   Brief Narrative:  Pt is 83 yo female who presented with one to two weeks duration of progressively worsening weakness, dyspnea with exertion and at rest. Her daughter says she has caught a flu last week and seems like she has not gotten over it since that time. She is not oxygen dependent at baseline.    On CT Scan, An incidental finding of potential clinical significance has been found. Borderline enhancement of an intermediate attenuation lesion arising exophytic from the upper pole of the left kidney. This could represent a low grade renal neoplasm such as papillary carcinoma. Recommend further evaluation with contrasted MRI of the abdomen.   Assessment & Plan:   Principal Problem:   Acute respiratory failure with hypoxia (HCC) - secondary to acute exacerbation of COPD in the setting of Rhinovirus + viral illness, acute bronchitis  - pt overall improving but still with rhonchi at bases - no wheezing on exam this AM - continue with bronchodilators scheduled and as needed - keep on oxygen for now to keep O2 sat > 90% - keep on empiric Levaquin - also continue with Prednisone taper   Active Problems:  Chronic diastolic heart failure (HCC) - no signs of volume overload  - monitor daily weights     Abnormal EKG, elevated trop - no chest pain this AM - on CT angio chest, evidence of prior MI - pt denies any known hx of MI and her daughter also denies any known hx of this     Hypertension, essential - keep on Enalapril and Metoprolol     Dyslipidemia - continue statin     Acute hypokalemia - supplemented and WNL this AM     History of endograft repair of thoracic aortic aneurysm (2005) - Underwent imaging of graft via CTA in April which demonstrated stability of graft - CTA done with incidental finding that will need outpatient MRI done to clarify    Left renal  mass - small, needs close follow up  - discussed with daughter over the phone, prefers not to tell mom at this time   Family Communication: daughter over the phone  Disposition Plan: home in 1-2 days LOVENOX SQ for DVT prophylaxis   Subjective: Pt reports feeling better but still with exertional dyspnea.   Objective: Vitals:   08/25/16 0600 08/25/16 0919 08/25/16 1000 08/25/16 1237  BP: (!) 117/59  107/62 108/60  Pulse: 83  90 78  Resp: 20   20  Temp: 98.4 F (36.9 C)   97.9 F (36.6 C)  TempSrc: Oral   Oral  SpO2: 92% 94% 94% 95%  Weight: 51.2 kg (112 lb 14.4 oz)     Height:        Intake/Output Summary (Last 24 hours) at 08/25/16 1806 Last data filed at 08/25/16 1418  Gross per 24 hour  Intake              840 ml  Output             1000 ml  Net             -160 ml   Filed Weights   08/23/16 0632 08/24/16 0640 08/25/16 0600  Weight: 51.6 kg (113 lb 11.2 oz) 51.5 kg (113 lb 9.6 oz) 51.2 kg (112 lb 14.4 oz)   Physical Exam  Constitutional: Appears well-developed and well-nourished. No distress.  Pulmonary:rhonhi  at bases with mild exp wheeze  Abdominal: Soft. BS +,  no distension, tenderness, rebound or guarding.  Musculoskeletal: Normal range of motion. No edema and no tenderness.   Data Reviewed: I have personally reviewed following labs and imaging studies  CBC:  Recent Labs Lab 08/22/16 0948 08/22/16 0959  WBC 5.4  --   NEUTROABS 3.4  --   HGB 13.6 14.6  HCT 42.9 43.0  MCV 84.8  --   PLT 137*  --    Basic Metabolic Panel:  Recent Labs Lab 08/22/16 0948 08/22/16 0959 08/23/16 0216 08/24/16 0315 08/25/16 0458  NA 141 140 137 138 139  K 3.5 3.4* 4.2 4.3 4.1  CL 100* 98* 100* 100* 102  CO2 30  --  29 31 31   GLUCOSE 132* 132* 150* 123* 99  BUN 10 13 16  23* 21*  CREATININE 0.82 0.70 1.04* 0.94 0.93  CALCIUM 10.0  --  10.2 10.0 9.8   Liver Function Tests:  Recent Labs Lab 08/22/16 0948  AST 33  ALT 19  ALKPHOS 75  BILITOT 0.9  PROT 6.8   ALBUMIN 3.6   Cardiac Enzymes:  Recent Labs Lab 08/22/16 1215 08/22/16 1805 08/22/16 2320 08/23/16 0814 08/23/16 1845  TROPONINI <0.03 <0.03 0.06* 0.09* 0.09*   Recent Results (from the past 240 hour(s))  Culture, blood (routine x 2) Call MD if unable to obtain prior to antibiotics being given     Status: None (Preliminary result)   Collection Time: 08/22/16 11:35 AM  Result Value Ref Range Status   Specimen Description BLOOD RIGHT FOREARM  Final   Special Requests   Final    BOTTLES DRAWN AEROBIC AND ANAEROBIC Blood Culture adequate volume   Culture NO GROWTH 3 DAYS  Final   Report Status PENDING  Incomplete  Culture, blood (routine x 2) Call MD if unable to obtain prior to antibiotics being given     Status: None (Preliminary result)   Collection Time: 08/22/16 11:46 AM  Result Value Ref Range Status   Specimen Description BLOOD RIGHT ARM  Final   Special Requests   Final    BOTTLES DRAWN AEROBIC AND ANAEROBIC Blood Culture adequate volume   Culture NO GROWTH 3 DAYS  Final   Report Status PENDING  Incomplete  Respiratory Panel by PCR     Status: Abnormal   Collection Time: 08/22/16  2:52 PM  Result Value Ref Range Status   Adenovirus NOT DETECTED NOT DETECTED Final   Coronavirus 229E NOT DETECTED NOT DETECTED Final   Coronavirus HKU1 NOT DETECTED NOT DETECTED Final   Coronavirus NL63 NOT DETECTED NOT DETECTED Final   Coronavirus OC43 NOT DETECTED NOT DETECTED Final   Metapneumovirus NOT DETECTED NOT DETECTED Final   Rhinovirus / Enterovirus DETECTED (A) NOT DETECTED Final   Influenza A NOT DETECTED NOT DETECTED Final   Influenza B NOT DETECTED NOT DETECTED Final   Parainfluenza Virus 1 NOT DETECTED NOT DETECTED Final   Parainfluenza Virus 2 NOT DETECTED NOT DETECTED Final   Parainfluenza Virus 3 NOT DETECTED NOT DETECTED Final   Parainfluenza Virus 4 NOT DETECTED NOT DETECTED Final   Respiratory Syncytial Virus NOT DETECTED NOT DETECTED Final   Bordetella pertussis  NOT DETECTED NOT DETECTED Final   Chlamydophila pneumoniae NOT DETECTED NOT DETECTED Final   Mycoplasma pneumoniae NOT DETECTED NOT DETECTED Final      Anti-infectives    Start     Dose/Rate Route Frequency Ordered Stop   08/22/16 1200  levofloxacin (LEVAQUIN)  IVPB 750 mg     750 mg 100 mL/hr over 90 Minutes Intravenous Every 48 hours 08/22/16 1129 08/28/16 1159     Radiology Studies: No results found.  Scheduled Meds: . aspirin  81 mg Oral Daily  . atorvastatin  80 mg Oral Daily  . calcium-vitamin D  1 tablet Oral Q breakfast  . enalapril  10 mg Oral Daily  . enoxaparin (LOVENOX) injection  40 mg Subcutaneous Q24H  . guaiFENesin  600 mg Oral BID  . ipratropium  0.5 mg Nebulization BID  . levalbuterol  0.63 mg Nebulization BID  . metoprolol tartrate  50 mg Oral BID  . multivitamin with minerals  1 tablet Oral Daily  . predniSONE  40 mg Oral Q breakfast  . sodium chloride flush  3 mL Intravenous Q12H   Continuous Infusions: . sodium chloride    . levofloxacin (LEVAQUIN) IV Stopped (08/24/16 1457)     LOS: 2 days   Time spent: 25 minutes   Faye Ramsay, MD [facility] Pager [phone]  If 7PM-7AM, please contact night-coverage [email or web address] Password [facility]  08/25/2016, 6:06 PM

## 2016-08-25 NOTE — Evaluation (Signed)
Physical Therapy Evaluation Patient Details Name: Maria Simon MRN: [id number] DOB: Mar 25, 1933 Today's Date: 08/25/2016   History of Present Illness  Pt is a 83 yo female admitted with c/o SoB dx acute respiratory failure with hypoxia and acute on chronic systolic heart failure. 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  Clinical Impression  Pt admitted with above diagnosis. Pt currently with functional limitations due to the deficits listed below (see PT Problem List). Pt limited by O2 desaturation and weakness. Pt currently, supervision with bed mobility, min guard for transfers and minA for ambulation of 100 feet with HHA. Pt will benefit from skilled PT for gait training with RW to increase their independence and safety with mobility to allow discharge to the venue listed below.       Follow Up Recommendations Home health PT;Supervision/Assistance - 24 hour    Equipment Recommendations  Rolling walker with 5" wheels    Recommendations for Other Services       Precautions / Restrictions Precautions Precautions: Fall Restrictions Weight Bearing Restrictions: No      Mobility  Bed Mobility Overal bed mobility: Needs Assistance Bed Mobility: Supine to Sit     Supine to sit: HOB elevated;Supervision     General bed mobility comments: supervision for safety  Transfers Overall transfer level: Needs assistance Equipment used: None Transfers: Sit to/from Stand Sit to Stand: Min guard         General transfer comment: min guard for safety, held onto bedside table to steady herself  Ambulation/Gait Ambulation/Gait assistance: Min assist Ambulation Distance (Feet): 100 Feet Assistive device: 1 person hand held assist Gait Pattern/deviations: Decreased step length - right;Decreased step length - left;Step-through  pattern;Trunk flexed;Shuffle Gait velocity: slowed Gait velocity interpretation: Below normal speed for age/gender General Gait Details: pt reports no AD use at home but utilized bed side table, and sink for stability and then asked for PT hand for steadying. minA to steady with gait, required 2 standing rest breaks for SoB and vc for pursed lipped breathing for O2 levels to increase         Balance Overall balance assessment: Needs assistance Sitting-balance support: No upper extremity supported;Feet supported Sitting balance-Leahy Scale: Good     Standing balance support: Single extremity supported Standing balance-Leahy Scale: Fair Standing balance comment: required bed side table to steady herself on coming to upright but then could stand statically                             Pertinent Vitals/Pain Pain Assessment: No/denies pain    Home Living Family/patient expects to be discharged to:: Private residence Living Arrangements: Alone Available Help at Discharge: Family;Available PRN/intermittently Type of Home: Mobile home Home Access: Stairs to enter Entrance Stairs-Rails: Can reach both Entrance Stairs-Number of Steps: 5 Home Layout: One level Home Equipment: None      Prior Function Level of Independence: Independent         Comments: limited community ambulator        Extremity/Trunk Assessment   Upper Extremity Assessment Upper Extremity Assessment: Generalized weakness    Lower Extremity Assessment Lower Extremity Assessment: Generalized weakness    Cervical / Trunk Assessment Cervical / Trunk Assessment: Kyphotic  Communication   Communication: No difficulties  Cognition Arousal/Alertness: Awake/alert Behavior During Therapy: WFL for tasks  assessed/performed Overall Cognitive Status: Within Functional Limits for tasks assessed                                        General Comments General comments (skin integrity,  edema, etc.): Pt on 2 L O2 via nasal cannula at entry, SaO2 at rest 92 %O2, HR 103 bpm, with ambulation on RA SaO2 dropped to 83%O2 HR  120 bpm , with pursed lipped breathing able to recover SaO2 to 88%O2, HR 107 bpm, ambulated back to room and placed back on 2L O2 via nasal cannula and SaO2 94%O2, HR 105 bpm          Assessment/Plan    PT Assessment Patient needs continued PT services  PT Problem List Decreased strength;Decreased activity tolerance;Decreased balance;Decreased mobility;Decreased knowledge of use of DME;Decreased safety awareness;Decreased knowledge of precautions       PT Treatment Interventions DME instruction;Gait training;Stair training;Functional mobility training;Therapeutic activities;Therapeutic exercise;Balance training;Patient/family education    PT Goals (Current goals can be found in the Care Plan section)  Acute Rehab PT Goals Patient Stated Goal: go home PT Goal Formulation: With patient Time For Goal Achievement: 09/01/16    Frequency Min 3X/week   Barriers to discharge Decreased caregiver support         AM-PAC PT "6 Clicks" Daily Activity  Outcome Measure Difficulty turning over in bed (including adjusting bedclothes, sheets and blankets)?: A Lot Difficulty moving from lying on back to sitting on the side of the bed? : A Lot Difficulty sitting down on and standing up from a chair with arms (e.g., wheelchair, bedside commode, etc,.)?: A Little Help needed moving to and from a bed to chair (including a wheelchair)?: A Little Help needed walking in hospital room?: A Little Help needed climbing 3-5 steps with a railing? : A Lot 6 Click Score: 15    End of Session Equipment Utilized During Treatment: Gait belt Activity Tolerance: Patient limited by fatigue;Other (comment) Patient left: in bed;with call bell/phone within reach;with bed alarm set Nurse Communication: Mobility status PT Visit Diagnosis: Unsteadiness on feet (R26.81);Other  abnormalities of gait and mobility (R26.89);Muscle weakness (generalized) (M62.81);Difficulty in walking, not elsewhere classified (R26.2)    Time: 6962-9528 PT Time Calculation (min) (ACUTE ONLY): 26 min   Charges:   PT Evaluation $PT Eval High Complexity: 1 Procedure PT Treatments $Gait Training: 8-22 mins   PT G Codes:        Ramil Edgington B. Migdalia Dk PT, DPT Acute Rehabilitation  [phone] Pager [phone]     [place] 08/25/2016, 10:13 AM

## 2016-08-25 NOTE — Progress Notes (Addendum)
SATURATION QUALIFICATIONS: (This note is used to comply with regulatory documentation for home oxygen)  Patient Saturations on Room Air at Rest = 95%  Patient Saturations on Room Air while Ambulating = 89%  Patient Saturations on 2 Liters of oxygen while Ambulating = 97%  Pt states "I don't feel as wiped as I did on this mornings walk"  Maria Simon

## 2016-08-26 DIAGNOSIS — R9431 Abnormal electrocardiogram [ECG] [EKG]: Secondary | ICD-10-CM

## 2016-08-26 LAB — BASIC METABOLIC PANEL
Anion gap: 5 (ref 5–15)
BUN: 26 mg/dL — AB (ref 6–20)
CHLORIDE: 102 mmol/L (ref 101–111)
CO2: 30 mmol/L (ref 22–32)
CREATININE: 0.85 mg/dL (ref 0.44–1.00)
Calcium: 9.5 mg/dL (ref 8.9–10.3)
GFR calc Af Amer: >60 mL/min (ref >60)
GLUCOSE: 102 mg/dL — AB (ref 65–99)
POTASSIUM: 4 mmol/L (ref 3.5–5.1)
SODIUM: 137 mmol/L (ref 135–145)

## 2016-08-26 LAB — CBC
HEMATOCRIT: 40.6 % (ref 36.0–46.0)
Hemoglobin: 12.5 g/dL (ref 12.0–15.0)
MCH: 26.2 pg (ref 26.0–34.0)
MCHC: 30.8 g/dL (ref 30.0–36.0)
MCV: 85.1 fL (ref 78.0–100.0)
PLATELETS: 185 10*3/uL (ref 150–400)
RBC: 4.77 MIL/uL (ref 3.87–5.11)
RDW: 15.7 % — AB (ref 11.5–15.5)
WBC: 9.5 10*3/uL (ref 4.0–10.5)

## 2016-08-26 MED ORDER — LEVALBUTEROL HCL 1.25 MG/0.5ML IN NEBU: 1.2500 mg | INHALATION_SOLUTION | Freq: Four times a day (QID) | RESPIRATORY_TRACT | Status: DC | PRN

## 2016-08-26 MED ORDER — GUAIFENESIN-DM 100-10 MG/5ML PO SYRP
5.0000 mL | ORAL_SOLUTION | ORAL | Status: DC | PRN
  Administered 2016-08-27: 5 mL via ORAL
  Filled 2016-08-26: qty 5

## 2016-08-26 MED ORDER — PREDNISONE 20 MG PO TABS
30.0000 mg | ORAL_TABLET | Freq: Every day | ORAL | Status: DC
  Administered 2016-08-27 – 2016-08-28 (×2): 30 mg via ORAL
  Filled 2016-08-26 (×2): qty 1

## 2016-08-26 MED ORDER — LEVALBUTEROL HCL 0.63 MG/3ML IN NEBU
0.6300 mg | INHALATION_SOLUTION | Freq: Four times a day (QID) | RESPIRATORY_TRACT | Status: DC
  Administered 2016-08-26 – 2016-08-28 (×9): 0.63 mg via RESPIRATORY_TRACT
  Filled 2016-08-26 (×10): qty 3

## 2016-08-26 MED ORDER — GUAIFENESIN ER 600 MG PO TB12: 600.0000 mg | ORAL_TABLET | Freq: Two times a day (BID) | ORAL | Status: DC

## 2016-08-26 MED ORDER — IPRATROPIUM BROMIDE 0.02 % IN SOLN
0.5000 mg | Freq: Four times a day (QID) | RESPIRATORY_TRACT | Status: DC
  Administered 2016-08-26 – 2016-08-28 (×9): 0.5 mg via RESPIRATORY_TRACT
  Filled 2016-08-26 (×10): qty 2.5

## 2016-08-26 NOTE — Progress Notes (Addendum)
Started taking care of the patient at 3pm, pt is sitting in a recliner, no complain of pain but complaining about not having good appetite, will continue to monitor.

## 2016-08-26 NOTE — Progress Notes (Signed)
PROGRESS NOTE    Maria Simon  DZH:[id number] DOB: 11-Apr-1933 DOA: 08/22/2016  PCP: Ria Bush, MD   Brief Narrative:  Pt is 83 yo female who presented with one to two weeks duration of progressively worsening weakness, dyspnea with exertion and at rest. Her daughter says she has caught a flu last week and seems like she has not gotten over it since that time. She is not oxygen dependent at baseline.   On CT Scan, An incidental finding of potential clinical significance has been found. Borderline enhancement of an intermediate attenuation lesion arising exophytic from the upper pole of the left kidney. This could represent a low grade renal neoplasm such as papillary carcinoma. Recommend further evaluation with contrasted MRI of the abdomen.  Assessment & Plan:   Principal Problem:   Acute respiratory failure with hypoxia (HCC) - secondary to acute exacerbation of COPD in the setting of Rhinovirus + viral illness, acute bronchitis  - rhonchi still on exam and more dyspnea this am especially with exertion  - no wheezing  - oxygen levels did drop yesterday pm and pt placed back on oxygen via [place] - continue bronchodilators scheduled and as needed  - pt also on empiric Levaquin which will continue  - I will add robitussin as needed as well as scheduled mucinex  - pt is on Prednisone 40 mg PO QD, will continue to taper down by 10 mg daily until completed  - I have discussed with pt and family diagnostic tests and have personally reviewed CXR and CT chest with pt and daughter at bedside due their concern of what they consider new diagnosis of COPD, we have discussed that constellation of findinsg on CXR and CT chest with pt's long hx of smoking is consistent with COPD and we have discussed necessary precautions in the future to hopefully avoid more exacerbations   Active Problems:  Chronic diastolic heart failure ([place]) - no signs of volume overload on exam - this is the weight trend  since admission  Filed Weights   08/24/16 0640 08/25/16 0600 08/26/16 0538  Weight: 51.5 kg (113 lb 9.6 oz) 51.2 kg (112 lb 14.4 oz) 51.7 kg (113 lb 14.4 oz)     Abnormal EKG, elevated trop - on CT angio chest, evidence of prior MI - pt denies any known hx of MI  - no chest pain this am - no need to cycle cardiac enzymes     Hypertension, essential - continue Enalapril and Metoprolol     Dyslipidemia - on statin     Acute hypokalemia - supplemented and WNL this AM - no need for repeat BMP in AM    History of endograft repair of thoracic aortic aneurysm (2005) - Underwent imaging of graft via CTA in April which demonstrated stability of graft - CTA done with incidental finding that will need outpatient MRI done to clarify    Left renal mass - small, needs close follow up  - discussed with daughter over the phone, prefers not to tell mom at this time   Family Communication: daughter at bedside  Disposition Plan: home possibly in AM  LOVENOX SQ for DVT prophylaxis   Subjective: Pt reports more dyspnea this am especially with exertion.   Objective: Vitals:   08/25/16 2159 08/26/16 0538 08/26/16 0941 08/26/16 1041  BP:  120/74 126/63   Pulse:  78 91   Resp:  18    Temp:  98.9 F (37.2 C) 97.8 F (36.6 C)  TempSrc:  Oral Oral   SpO2: 95% 96% 92% 91%  Weight:  51.7 kg (113 lb 14.4 oz)    Height:        Intake/Output Summary (Last 24 hours) at 08/26/16 1109 Last data filed at 08/26/16 1008  Gross per 24 hour  Intake              961 ml  Output             1450 ml  Net             -489 ml   Filed Weights   08/24/16 0640 08/25/16 0600 08/26/16 0538  Weight: 51.5 kg (113 lb 9.6 oz) 51.2 kg (112 lb 14.4 oz) 51.7 kg (113 lb 14.4 oz)   Physical Exam  Constitutional: Appears well-developed and well-nourished. No distress.  CVS: RRR, S1/S2 +, no murmurs, no gallops, no carotid bruit.  Pulmonary: Effort and breath sounds normal, scattered rhonchi bilaterally with no  wheezing  Abdominal: Soft. BS +,  no distension, tenderness, rebound or guarding.  Musculoskeletal: Normal range of motion. No edema and no tenderness.  Lymphadenopathy: No lymphadenopathy noted, cervical, inguinal. Neuro: Alert. Normal reflexes, muscle tone coordination. No cranial nerve deficit. Skin: Skin is warm and dry. No rash noted. Not diaphoretic. No erythema. No pallor.  Psychiatric: Normal mood and affect. Behavior, judgment, thought content normal.   Data Reviewed: I have personally reviewed following labs and imaging studies  CBC:  Recent Labs Lab 08/22/16 0948 08/22/16 0959 08/26/16 0343  WBC 5.4  --  9.5  NEUTROABS 3.4  --   --   HGB 13.6 14.6 12.5  HCT 42.9 43.0 40.6  MCV 84.8  --  85.1  PLT 137*  --  119   Basic Metabolic Panel:  Recent Labs Lab 08/22/16 0948 08/22/16 0959 08/23/16 0216 08/24/16 0315 08/25/16 0458 08/26/16 0343  NA 141 140 137 138 139 137  K 3.5 3.4* 4.2 4.3 4.1 4.0  CL 100* 98* 100* 100* 102 102  CO2 30  --  29 31 31 30   GLUCOSE 132* 132* 150* 123* 99 102*  BUN 10 13 16  23* 21* 26*  CREATININE 0.82 0.70 1.04* 0.94 0.93 0.85  CALCIUM 10.0  --  10.2 10.0 9.8 9.5   Liver Function Tests:  Recent Labs Lab 08/22/16 0948  AST 33  ALT 19  ALKPHOS 75  BILITOT 0.9  PROT 6.8  ALBUMIN 3.6   Cardiac Enzymes:  Recent Labs Lab 08/22/16 1215 08/22/16 1805 08/22/16 2320 08/23/16 0814 08/23/16 1845  TROPONINI <0.03 <0.03 0.06* 0.09* 0.09*   Recent Results (from the past 240 hour(s))  Culture, blood (routine x 2) Call MD if unable to obtain prior to antibiotics being given     Status: None (Preliminary result)   Collection Time: 08/22/16 11:35 AM  Result Value Ref Range Status   Specimen Description BLOOD RIGHT FOREARM  Final   Special Requests   Final    BOTTLES DRAWN AEROBIC AND ANAEROBIC Blood Culture adequate volume   Culture NO GROWTH 4 DAYS  Final   Report Status PENDING  Incomplete  Culture, blood (routine x 2) Call MD  if unable to obtain prior to antibiotics being given     Status: None (Preliminary result)   Collection Time: 08/22/16 11:46 AM  Result Value Ref Range Status   Specimen Description BLOOD RIGHT ARM  Final   Special Requests   Final    BOTTLES DRAWN AEROBIC AND ANAEROBIC Blood Culture  adequate volume   Culture NO GROWTH 4 DAYS  Final   Report Status PENDING  Incomplete  Respiratory Panel by PCR     Status: Abnormal   Collection Time: 08/22/16  2:52 PM  Result Value Ref Range Status   Adenovirus NOT DETECTED NOT DETECTED Final   Coronavirus 229E NOT DETECTED NOT DETECTED Final   Coronavirus HKU1 NOT DETECTED NOT DETECTED Final   Coronavirus NL63 NOT DETECTED NOT DETECTED Final   Coronavirus OC43 NOT DETECTED NOT DETECTED Final   Metapneumovirus NOT DETECTED NOT DETECTED Final   Rhinovirus / Enterovirus DETECTED (A) NOT DETECTED Final   Influenza A NOT DETECTED NOT DETECTED Final   Influenza B NOT DETECTED NOT DETECTED Final   Parainfluenza Virus 1 NOT DETECTED NOT DETECTED Final   Parainfluenza Virus 2 NOT DETECTED NOT DETECTED Final   Parainfluenza Virus 3 NOT DETECTED NOT DETECTED Final   Parainfluenza Virus 4 NOT DETECTED NOT DETECTED Final   Respiratory Syncytial Virus NOT DETECTED NOT DETECTED Final   Bordetella pertussis NOT DETECTED NOT DETECTED Final   Chlamydophila pneumoniae NOT DETECTED NOT DETECTED Final   Mycoplasma pneumoniae NOT DETECTED NOT DETECTED Final      Anti-infectives    Start     Dose/Rate Route Frequency Ordered Stop   08/22/16 1200  levofloxacin (LEVAQUIN) IVPB 750 mg     750 mg 100 mL/hr over 90 Minutes Intravenous Every 48 hours 08/22/16 1129 08/28/16 1159     Radiology Studies: No results found.  Scheduled Meds: . aspirin  81 mg Oral Daily  . atorvastatin  80 mg Oral Daily  . calcium-vitamin D  1 tablet Oral Q breakfast  . enalapril  10 mg Oral Daily  . enoxaparin (LOVENOX) injection  40 mg Subcutaneous Q24H  . guaiFENesin  600 mg Oral BID    . guaiFENesin  600 mg Oral BID  . ipratropium  0.5 mg Nebulization BID  . levalbuterol  0.63 mg Nebulization BID  . metoprolol tartrate  50 mg Oral BID  . multivitamin with minerals  1 tablet Oral Daily  . predniSONE  40 mg Oral Q breakfast  . sodium chloride flush  3 mL Intravenous Q12H   Continuous Infusions: . sodium chloride    . levofloxacin (LEVAQUIN) IV Stopped (08/24/16 1457)     LOS: 3 days   Time spent: 35 minutes, 50% of this time was spend on counseling and reviewing medications, diagnostic studies with pt and family at bedside   Faye Ramsay, MD [facility] Pager [phone]  If 7PM-7AM, please contact night-coverage [email or web address] Password [facility]  08/26/2016, 11:09 AM

## 2016-08-27 DIAGNOSIS — F17210 Nicotine dependence, cigarettes, uncomplicated: Secondary | ICD-10-CM

## 2016-08-27 LAB — CULTURE, BLOOD (ROUTINE X 2)
Culture: NO GROWTH
Culture: NO GROWTH
SPECIAL REQUESTS: ADEQUATE
Special Requests: ADEQUATE

## 2016-08-27 MED ORDER — BUDESONIDE 0.25 MG/2ML IN SUSP
0.2500 mg | Freq: Two times a day (BID) | RESPIRATORY_TRACT | Status: DC
  Administered 2016-08-27 – 2016-08-28 (×3): 0.25 mg via RESPIRATORY_TRACT
  Filled 2016-08-27 (×3): qty 2

## 2016-08-27 NOTE — Progress Notes (Addendum)
PROGRESS NOTE    Maria Simon  XBM:[id number] DOB: 23-Sep-1933 DOA: 08/22/2016  PCP: Ria Bush, MD   Brief Narrative:  Pt is 83 yo female who presented with one to two weeks duration of progressively worsening weakness, dyspnea with exertion and at rest. Her daughter says she has caught a flu last week and seems like she has not gotten over it since that time. She is not oxygen dependent at baseline.   On CT Scan, An incidental finding of potential clinical significance has been found. Borderline enhancement of an intermediate attenuation lesion arising exophytic from the upper pole of the left kidney. This could represent a low grade renal neoplasm such as papillary carcinoma. Recommend further evaluation with contrasted MRI of the abdomen.  Assessment & Plan:   Principal Problem:   Acute respiratory failure with hypoxia ([place]), long history of smoking > 60 years  - secondary to acute exacerbation of COPD in the setting of Rhinovirus + viral illness, acute bronchitis  - pt overall improving but still unable to taper off oxygen - she is still wheezing and has rhonchi bilaterally  - pt has completed therapy with Levaquin from 6/19 - 6/23 (was getting it Q48 hours, so total three doses)  - will continue BD's scheduled and as needed - continue Prednisone 30 mg PO QD for now - will need close outpatient follow up  - pt has been counseled on smoking cessation   Active Problems:  Chronic diastolic heart failure ([place]) - no signs of volume overload on exam  - weight has been stable since admission  [facility] Weights   08/25/16 0600 08/26/16 0538 08/27/16 0542  Weight: 51.2 kg (112 lb 14.4 oz) 51.7 kg (113 lb 14.4 oz) 52 kg (114 lb 11.2 oz)     Abnormal EKG, elevated trop - no chest pain this AM - d/c tele     Hypertension, essential - reasonable inpatient control     Dyslipidemia - on statin     Acute hypokalemia - has been supplemented     History of endograft repair of  thoracic aortic aneurysm (2005) - Underwent imaging of graft via CTA in April which demonstrated stability of graft - CTA done with incidental finding that will need outpatient MRI done to clarify    Left renal mass - small, needs close follow up  - discussed with daughter over the phone, prefers not to tell mom at this time   Family Communication: daughter at bedside  Disposition Plan: home in AM, [facility] PT requested  LOVENOX SQ for DVT prophylaxis  CODE STATUS: DNR  Subjective: Pt still dyspneic but says overall better.   Objective: Vitals:   08/27/16 0227 08/27/16 0542 08/27/16 0715 08/27/16 1038  BP:  (!) 110/57    Pulse:  95    Resp:  18    Temp:  98.4 F (36.9 C)    TempSrc:  Oral    SpO2: 94% 95% 92% 95%  Weight:  52 kg (114 lb 11.2 oz)    Height:        Intake/Output Summary (Last 24 hours) at 08/27/16 1329 Last data filed at 08/27/16 0600  Gross per 24 hour  Intake              363 ml  Output             1100 ml  Net             -737 ml   [employer]  08/25/16 0600 08/26/16 0538 08/27/16 0542  Weight: 51.2 kg (112 lb 14.4 oz) 51.7 kg (113 lb 14.4 oz) 52 kg (114 lb 11.2 oz)   Physical Exam  Constitutional: Appears well-developed and well-nourished. No distress.  CVS: RRR, S1/S2 +, no murmurs, no gallops, no carotid bruit.  Pulmonary: bilateral rhonchi noted mostly in lower lobes, no wheezing  Abdominal: Soft. BS +,  no distension, tenderness, rebound or guarding.  Musculoskeletal: Normal range of motion. No edema and no tenderness.  Lymphadenopathy: No lymphadenopathy noted, cervical, inguinal. Neuro: Alert. Normal reflexes, muscle tone coordination. No cranial nerve deficit. Skin: Skin is warm and dry. No rash noted. Not diaphoretic. No erythema. No pallor.  Psychiatric: Normal mood and affect. Behavior, judgment, thought content normal.   Data Reviewed: I have personally reviewed following labs and imaging studies  CBC:  Recent Labs Lab  08/22/16 0948 08/22/16 0959 08/26/16 0343  WBC 5.4  --  9.5  NEUTROABS 3.4  --   --   HGB 13.6 14.6 12.5  HCT 42.9 43.0 40.6  MCV 84.8  --  85.1  PLT 137*  --  093   Basic Metabolic Panel:  Recent Labs Lab 08/22/16 0948 08/22/16 0959 08/23/16 0216 08/24/16 0315 08/25/16 0458 08/26/16 0343  NA 141 140 137 138 139 137  K 3.5 3.4* 4.2 4.3 4.1 4.0  CL 100* 98* 100* 100* 102 102  CO2 30  --  29 31 31 30   GLUCOSE 132* 132* 150* 123* 99 102*  BUN 10 13 16  23* 21* 26*  CREATININE 0.82 0.70 1.04* 0.94 0.93 0.85  CALCIUM 10.0  --  10.2 10.0 9.8 9.5   Liver Function Tests:  Recent Labs Lab 08/22/16 0948  AST 33  ALT 19  ALKPHOS 75  BILITOT 0.9  PROT 6.8  ALBUMIN 3.6   Cardiac Enzymes:  Recent Labs Lab 08/22/16 1215 08/22/16 1805 08/22/16 2320 08/23/16 0814 08/23/16 1845  TROPONINI <0.03 <0.03 0.06* 0.09* 0.09*   Recent Results (from the past 240 hour(s))  Culture, blood (routine x 2) Call MD if unable to obtain prior to antibiotics being given     Status: None   Collection Time: 08/22/16 11:35 AM  Result Value Ref Range Status   Specimen Description BLOOD RIGHT FOREARM  Final   Special Requests   Final    BOTTLES DRAWN AEROBIC AND ANAEROBIC Blood Culture adequate volume   Culture NO GROWTH 5 DAYS  Final   Report Status 08/27/2016 FINAL  Final  Culture, blood (routine x 2) Call MD if unable to obtain prior to antibiotics being given     Status: None   Collection Time: 08/22/16 11:46 AM  Result Value Ref Range Status   Specimen Description BLOOD RIGHT ARM  Final   Special Requests   Final    BOTTLES DRAWN AEROBIC AND ANAEROBIC Blood Culture adequate volume   Culture NO GROWTH 5 DAYS  Final   Report Status 08/27/2016 FINAL  Final  Respiratory Panel by PCR     Status: Abnormal   Collection Time: 08/22/16  2:52 PM  Result Value Ref Range Status   Adenovirus NOT DETECTED NOT DETECTED Final   Coronavirus 229E NOT DETECTED NOT DETECTED Final   Coronavirus HKU1  NOT DETECTED NOT DETECTED Final   Coronavirus NL63 NOT DETECTED NOT DETECTED Final   Coronavirus OC43 NOT DETECTED NOT DETECTED Final   Metapneumovirus NOT DETECTED NOT DETECTED Final   Rhinovirus / Enterovirus DETECTED (A) NOT DETECTED Final   Influenza A  NOT DETECTED NOT DETECTED Final   Influenza B NOT DETECTED NOT DETECTED Final   Parainfluenza Virus 1 NOT DETECTED NOT DETECTED Final   Parainfluenza Virus 2 NOT DETECTED NOT DETECTED Final   Parainfluenza Virus 3 NOT DETECTED NOT DETECTED Final   Parainfluenza Virus 4 NOT DETECTED NOT DETECTED Final   Respiratory Syncytial Virus NOT DETECTED NOT DETECTED Final   Bordetella pertussis NOT DETECTED NOT DETECTED Final   Chlamydophila pneumoniae NOT DETECTED NOT DETECTED Final   Mycoplasma pneumoniae NOT DETECTED NOT DETECTED Final      Anti-infectives    Start     Dose/Rate Route Frequency Ordered Stop   08/22/16 1200  levofloxacin (LEVAQUIN) IVPB 750 mg     750 mg 100 mL/hr over 90 Minutes Intravenous Every 48 hours 08/22/16 1129 08/26/16 1250     Radiology Studies: No results found.  Scheduled Meds: . aspirin  81 mg Oral Daily  . atorvastatin  80 mg Oral Daily  . budesonide (PULMICORT) nebulizer solution  0.25 mg Nebulization BID  . calcium-vitamin D  1 tablet Oral Q breakfast  . enalapril  10 mg Oral Daily  . enoxaparin (LOVENOX) injection  40 mg Subcutaneous Q24H  . guaiFENesin  600 mg Oral BID  . ipratropium  0.5 mg Nebulization Q6H  . levalbuterol  0.63 mg Nebulization Q6H  . metoprolol tartrate  50 mg Oral BID  . multivitamin with minerals  1 tablet Oral Daily  . predniSONE  30 mg Oral Q breakfast  . sodium chloride flush  3 mL Intravenous Q12H   Continuous Infusions: . sodium chloride       LOS: 4 days   Time spent: 25 minutes   Faye Ramsay, MD [facility] Pager [phone]  If 7PM-7AM, please contact night-coverage [email or web address] Password [email or web address]  08/27/2016, 1:29 PM

## 2016-08-27 NOTE — Progress Notes (Signed)
Tele DC at 2.13pm, CCMD informed

## 2016-08-27 NOTE — Progress Notes (Signed)
SATURATION QUALIFICATIONS: (This note is used to comply with regulatory documentation for home oxygen)  Patient Saturations on Room Air at Rest = 89%  Patient Saturations on Room Air while Ambulating = 89%  Patient Saturations on 2 Liters of oxygen while Ambulating = 95%  Please briefly explain why patient needs home oxygen:

## 2016-08-28 MED ORDER — BENZONATATE 100 MG PO CAPS: 100.0000 mg | ORAL_CAPSULE | Freq: Two times a day (BID) | ORAL | 0 refills | Status: DC | PRN

## 2016-08-28 MED ORDER — LEVALBUTEROL HCL 1.25 MG/0.5ML IN NEBU
1.2500 mg | INHALATION_SOLUTION | Freq: Four times a day (QID) | RESPIRATORY_TRACT | 1 refills | Status: AC | PRN
Start: 2016-08-28 — End: ?

## 2016-08-28 MED ORDER — GUAIFENESIN ER 600 MG PO TB12: 600.0000 mg | ORAL_TABLET | Freq: Two times a day (BID) | ORAL | Status: DC

## 2016-08-28 MED ORDER — BUDESONIDE 0.25 MG/2ML IN SUSP: 0.2500 mg | Freq: Two times a day (BID) | RESPIRATORY_TRACT | 1 refills | Status: DC

## 2016-08-28 MED ORDER — BISACODYL 5 MG PO TBEC: 5.0000 mg | DELAYED_RELEASE_TABLET | Freq: Every day | ORAL | 0 refills | Status: AC | PRN

## 2016-08-28 NOTE — Discharge Summary (Signed)
Physician Discharge Summary  Maria Simon STM:[id number] DOB: Oct 30, 1933 DOA: 08/22/2016  PCP: Ria Bush, MD  Admit date: 08/22/2016 Discharge date: 08/28/2016  Recommendations for Outpatient Follow-up:  1. Pt will need to follow up with PCP in 1-2 weeks post discharge 2. Please obtain BMP to evaluate electrolytes and kidney function 3. Please also check CBC to evaluate Hg and Hct levels 4. Please note that pt was discharged on home oxygen 5. Consider referral to pulmonologist for further follow up on this newly diagnosed COPD 6. Please also note that pt was given home nebulizer machine to use nebs as needed   On CT Scan, An incidental finding of potential clinical significance has been found. Borderline enhancement of an intermediate attenuation lesion arising exophytic from the upper pole of the left kidney. This could represent a low grade renal neoplasm such as papillary carcinoma. Recommend further evaluation with contrasted MRI of the abdomen. Please note that per daughter request this was not discussed with patient until follow up studies done and conform the concern above.    Discharge Diagnoses:  Principal Problem:   Acute respiratory failure with hypoxia (HCC) Active Problems:   Acute on chronic systolic heart failure (HCC)   COPD with acute bronchitis (HCC)   Abnormal EKG   Hypertension   History of endograft repair of thoracic aortic aneurysm (2005)   Dyslipidemia   Acute hypokalemia   Thrombocytopenia (HCC)   Tobacco abuse  Discharge Condition: Stable  Diet recommendation: Heart healthy diet discussed in details   History of present illness:  Pt is 83 yo female who presented with one to two weeks duration of progressively worsening weakness, dyspnea with exertion and at rest. Her daughter says she has caught a flu last week and seems like she has not gotten over it since that time. She is not oxygen dependent at baseline.   On CT Scan, An incidental  finding of potential clinical significance has been found. Borderline enhancement of an intermediate attenuation lesion arising exophytic from the upper pole of the left kidney. This could represent a low grade renal neoplasm such as papillary carcinoma. Recommend further evaluation with contrasted MRI of the abdomen.  Assessment & Plan:   Principal Problem:   Acute respiratory failure with hypoxia ([place]), long history of smoking > 60 years  - secondary to acute exacerbation of COPD in the setting of Rhinovirus + viral illness, acute bronchitis  - pt overall improving but still unable to taper off oxygen - wants to go home, completed Prednisone taper  - pt has completed therapy with Levaquin from 6/19 - 6/23 (was getting it Q48 hours, so total three doses)  - will continue BD's as needed - will need close outpatient follow up  - pt has been counseled on smoking cessation   Active Problems:  Chronic diastolic heart failure ([place]) - no signs of volume overload on exam  - weight has been stable since admission       [facility] Weights   08/25/16 0600 08/26/16 0538 08/27/16 0542  Weight: 51.2 kg (112 lb 14.4 oz) 51.7 kg (113 lb 14.4 oz) 52 kg (114 lb 11.2 oz)     Abnormal EKG, elevated trop - no chest pain this AM    Hypertension, essential - reasonable inpatient control     Dyslipidemia - on statin     Acute hypokalemia - has been supplemented   History of endograft repair of thoracic aortic aneurysm (2005) - Underwent imaging of graft via CTA  in April which demonstrated stability of graft - CTA done with incidental finding that will need outpatient MRI done to clarify    Left renal mass - small, needs close follow up  - discussed with daughter over the phone, prefers not to tell mom at this time   Family Communication: daughter at bedside  Disposition Plan: home  LOVENOX SQ for DVT prophylaxis  CODE STATUS: DNR   Procedures/Studies: Dg Chest 2 View  Result Date:  08/23/2016 CLINICAL DATA:  Cough.  Shortness of breath.  Hypoxia. EXAM: CHEST  2 VIEW COMPARISON:  Chest x-ray 08/22/2016.  Chest CT 08/22/2016 . FINDINGS: Thoracic aortic aneurysm with aortic stent graft again noted. Heart size normal. Mild basilar subsegmental atelectasis. No pleural effusion or pneumothorax. Surgical clips upper abdomen. IMPRESSION: 1. Aortic aneurysm with aortic stent graft again noted. No significant change in appearance. 2. Low lung volumes with mild basilar atelectasis. Electronically Signed   By: Marcello Moores  Register   On: 08/23/2016 12:33   Dg Chest Port 1 View  Result Date: 08/22/2016 CLINICAL DATA:  Shortness of breath and cough for 1 week EXAM: PORTABLE CHEST 1 VIEW COMPARISON:  Chest radiograph May 31, 2012 and chest CT June 27, 2012 FINDINGS: There is no edema or consolidation. Heart is mildly enlarged with pulmonary vascularity within normal limits. There is a stent graft traversing a descending thoracic aortic aneurysm, a finding present previously. No adenopathy. No focal bone lesions. IMPRESSION: Stent graft traversing a descending thoracic aortic aneurysm, a finding seen previously. No edema or consolidation. Stable cardiac prominence. Electronically Signed   By: Lowella Grip III M.D.   On: 08/22/2016 10:16   Ct Angio Chest Aorta W/cm &/or Wo/cm  Result Date: 08/22/2016 CLINICAL DATA:  83 year old female with new onset dyspnea on exertion and orthopnea. Known history of thoracic aortic aneurysm status post TEVAR reportedly in 2005. EXAM: CT ANGIOGRAPHY CHEST WITH CONTRAST TECHNIQUE: Multidetector CT imaging of the chest was performed using the standard protocol during bolus administration of intravenous contrast. Multiplanar CT image reconstructions and MIPs were obtained to evaluate the vascular anatomy. CONTRAST:  80 mL Isovue-300 COMPARISON:  Prior CT scan of the chest 06/27/2016 FINDINGS: Cardiovascular: Aneurysmal dilatation of the aortic arch measures 3.9 cm  which is insignificantly changed compared to 3.8 cm previously. Surgical changes of prior thoracic aortic aneurysm repair using nest did core endo prostheses extending from the distal aortic infundibulum throughout the visualized descending thoracic aorta and into the super renal abdominal aorta. No evidence unchanging configuration of the endograft system. The excluded aneurysm sac measures 10.3 x 6.8 cm in diameter, unchanged compared to prior. The super renal abdominal aorta is irregular in aneurysmal with a maximal diameter of 3.8 cm which is unchanged compared to prior. Of note, the distal most landing zone is not completely opposed to this irregular aneurysmal segment, however there is sufficient seal proximal to this that a type 1B endoleak is highly unlikely. The origin of the mesenteric vessels are ligated. Incompletely imaged evidence of surgical debranching with probable distal anastomosis. All visualized vessels are patent. Both renal arteries are patent. Calcifications are present along the aortic valve leaflets. There is left ventricular dilatation with marked thinning of the lateral wall consistent with prior lateral wall infarction. No pericardial effusion. The main and central pulmonary arteries are well visualized and well opacified. No evidence of pulmonary arterial enlargement or pulmonary embolus. Mediastinum/Nodes: Approximately 1 cm hypoechoic nodule within the right thyroid gland demonstrates no significant interval change compared to prior.  New mediastinal adenopathy. The visualized thoracic esophagus is unremarkable. Lungs/Pleura: Moderate centrilobular pulmonary emphysema. No suspicious pulmonary nodule or mass. No focal airspace consolidation, pleural effusion, pneumothorax or evidence of pulmonary edema. Upper Abdomen: 1.8 cm intermediate attenuation lesion arising exophytic from the upper pole of the left kidney demonstrates borderline enhancement between the non contrasted series and  the arterial phase series. Musculoskeletal: No acute fracture or aggressive appearing lytic or blastic osseous lesion. Stable T10 compression fracture. Review of the MIP images confirms the above findings. IMPRESSION: 1. No acute abnormality within the chest to explain the patient's clinical symptoms. Specifically, no evidence of pulmonary embolus, acute aortic abnormality, pulmonary edema or pneumonia. 2. Moderately severe centrilobular pulmonary emphysema. 3. **An incidental finding of potential clinical significance has been found. Borderline enhancement of an intermediate attenuation lesion arising exophytic from the upper pole of the left kidney. This could represent a low grade renal neoplasm such as papillary carcinoma. Recommend further evaluation with contrasted MRI of the abdomen.** 4. Stable changes of thoracic and upper abdominal aneurysm status post TEVAR without significant interval change compared to 06/27/2016. 5. Left ventricular dilatation with marked thinning of the lateral and inferolateral walls consistent with prior myocardial infarction. The thinned segment also bows outward slightly suggesting early post MI aneurysmal dilatation of the left ventricle. 6. Aortic valve calcifications. 7. Stable T10 compression fracture. Electronically Signed   By: Jacqulynn Cadet M.D.   On: 08/22/2016 13:56     Discharge Exam: Vitals:   08/27/16 2039 08/28/16 0500  BP: (!) 97/51 126/63  Pulse: 96 72  Resp:  18  Temp: 98.4 F (36.9 C) 98.1 F (36.7 C)   Vitals:   08/27/16 1955 08/27/16 2039 08/28/16 0500 08/28/16 0734  BP:  (!) 97/51 126/63   Pulse:  96 72   Resp:   18   Temp:  98.4 F (36.9 C) 98.1 F (36.7 C)   TempSrc:  Oral Oral   SpO2: 94% 92% 95% 95%  Weight:   52.4 kg (115 lb 8 oz)   Height:        General: Pt is alert, follows commands appropriately, not in acute distress Cardiovascular: Regular rate and rhythm, S1/S2 +, no murmurs, no rubs, no gallops Respiratory: Clear  to auscultation bilaterally, rhonchi at bases  Abdominal: Soft, non tender, non distended, bowel sounds +, no guarding Extremities: no edema, no cyanosis, pulses palpable bilaterally DP and PT Neuro: Grossly nonfocal  Discharge Instructions   Allergies as of 08/28/2016      Reactions   Fosamax [alendronate Sodium] Other (See Comments)   Groin pain      Medication List    TAKE these medications   aspirin 81 MG tablet Take 81 mg by mouth daily.   atorvastatin 80 MG tablet Commonly known as:  LIPITOR TAKE 1 TABLET BY MOUTH EVERY DAY   benzonatate 100 MG capsule Commonly known as:  TESSALON Take 1 capsule (100 mg total) by mouth 2 (two) times daily as needed for cough.   bisacodyl 5 MG EC tablet Commonly known as:  DULCOLAX Take 1 tablet (5 mg total) by mouth daily as needed for moderate constipation.   budesonide 0.25 MG/2ML nebulizer solution Commonly known as:  PULMICORT Take 2 mLs (0.25 mg total) by nebulization 2 (two) times daily.   Calcium-Vitamin D 600-400 MG-UNIT Tabs Take 1 tablet by mouth every other day.   enalapril 10 MG tablet Commonly known as:  VASOTEC TAKE 1 TABLET BY MOUTH EVERY DAY  guaiFENesin 600 MG 12 hr tablet Commonly known as:  MUCINEX Take 1 tablet (600 mg total) by mouth 2 (two) times daily.   hydrochlorothiazide 12.5 MG capsule Commonly known as:  MICROZIDE TAKE 1 CAPSULE BY MOUTH EVERY DAY   levalbuterol 1.25 MG/0.5ML nebulizer solution Commonly known as:  XOPENEX Take 1.25 mg by nebulization every 6 (six) hours as needed for wheezing or shortness of breath.   metoprolol tartrate 50 MG tablet Commonly known as:  LOPRESSOR TAKE 1 TABLET BY MOUTH TWICE DAILY   multivitamin tablet Take 1 tablet by mouth daily. Centrum Silver            Durable Medical Equipment        Start     Ordered   08/28/16 1044  For home use only DME oxygen  Once    Question Answer Comment  Mode or (Route) Nasal cannula   Liters per Minute 2    Frequency Continuous (stationary and portable oxygen unit needed)   Oxygen delivery system Gas      08/28/16 1043     Follow-up Information    Home, [facility] Follow up.   Specialty:  [place] Why:  They will do your home health care at your home Contact information: [address] [place] [address] [phone]        Ria Bush, MD Follow up.   Specialty:  Family Medicine Contact information: [place] [address] [phone]           The results of significant diagnostics from this hospitalization (including imaging, microbiology, ancillary and laboratory) are listed below for reference.    Microbiology: Recent Results (from the past 240 hour(s))  Culture, blood (routine x 2) Call MD if unable to obtain prior to antibiotics being given     Status: None   Collection Time: 08/22/16 11:35 AM  Result Value Ref Range Status   Specimen Description BLOOD RIGHT FOREARM  Final   Special Requests   Final    BOTTLES DRAWN AEROBIC AND ANAEROBIC Blood Culture adequate volume   Culture NO GROWTH 5 DAYS  Final   Report Status 08/27/2016 FINAL  Final  Culture, blood (routine x 2) Call MD if unable to obtain prior to antibiotics being given     Status: None   Collection Time: 08/22/16 11:46 AM  Result Value Ref Range Status   Specimen Description BLOOD RIGHT ARM  Final   Special Requests   Final    BOTTLES DRAWN AEROBIC AND ANAEROBIC Blood Culture adequate volume   Culture NO GROWTH 5 DAYS  Final   Report Status 08/27/2016 FINAL  Final  Respiratory Panel by PCR     Status: Abnormal   Collection Time: 08/22/16  2:52 PM  Result Value Ref Range Status   Adenovirus NOT DETECTED NOT DETECTED Final   Coronavirus 229E NOT DETECTED NOT DETECTED Final   Coronavirus HKU1 NOT DETECTED NOT DETECTED Final   Coronavirus NL63 NOT DETECTED NOT DETECTED Final   Coronavirus OC43 NOT DETECTED NOT DETECTED Final   Metapneumovirus NOT  DETECTED NOT DETECTED Final   Rhinovirus / Enterovirus DETECTED (A) NOT DETECTED Final   Influenza A NOT DETECTED NOT DETECTED Final   Influenza B NOT DETECTED NOT DETECTED Final   Parainfluenza Virus 1 NOT DETECTED NOT DETECTED Final   Parainfluenza Virus 2 NOT DETECTED NOT DETECTED Final   Parainfluenza Virus 3 NOT DETECTED NOT DETECTED Final   Parainfluenza Virus 4  NOT DETECTED NOT DETECTED Final   Respiratory Syncytial Virus NOT DETECTED NOT DETECTED Final   Bordetella pertussis NOT DETECTED NOT DETECTED Final   Chlamydophila pneumoniae NOT DETECTED NOT DETECTED Final   Mycoplasma pneumoniae NOT DETECTED NOT DETECTED Final     Labs: Basic Metabolic Panel:  Recent Labs Lab 08/22/16 0948 08/22/16 0959 08/23/16 0216 08/24/16 0315 08/25/16 0458 08/26/16 0343  NA 141 140 137 138 139 137  K 3.5 3.4* 4.2 4.3 4.1 4.0  CL 100* 98* 100* 100* 102 102  CO2 30  --  29 31 31 30   GLUCOSE 132* 132* 150* 123* 99 102*  BUN 10 13 16  23* 21* 26*  CREATININE 0.82 0.70 1.04* 0.94 0.93 0.85  CALCIUM 10.0  --  10.2 10.0 9.8 9.5   Liver Function Tests:  Recent Labs Lab 08/22/16 0948  AST 33  ALT 19  ALKPHOS 75  BILITOT 0.9  PROT 6.8  ALBUMIN 3.6   CBC:  Recent Labs Lab 08/22/16 0948 08/22/16 0959 08/26/16 0343  WBC 5.4  --  9.5  NEUTROABS 3.4  --   --   HGB 13.6 14.6 12.5  HCT 42.9 43.0 40.6  MCV 84.8  --  85.1  PLT 137*  --  185   Cardiac Enzymes:  Recent Labs Lab 08/22/16 1215 08/22/16 1805 08/22/16 2320 08/23/16 0814 08/23/16 1845  TROPONINI <0.03 <0.03 0.06* 0.09* 0.09*   BNP: BNP (last 3 results)  Recent Labs  08/22/16 0958  BNP 188.5*   SIGNED: Time coordinating discharge: 45 minutes  Faye Ramsay, MD  [facility] 08/28/2016, 10:53 AM Pager [phone]  If 7PM-7AM, please contact night-coverage [email or web address] Password [email or web address]

## 2016-08-28 NOTE — Care Management Note (Signed)
Case Management Note  Patient Details  Name: Maria Simon MRN: [id number] Date of Birth: 03/03/1934  Subjective/Objective:        Patient presented with dyspnea on exertion with weakness.            Action/Plan: CM received call from [place] concerning patient being up for discharge this evening.  Recommendations for [facility] services DME home oxygen, rolling walker, nebulizer machine. CM explained that due after hours DME agencies are limited, CM will  Contact [facility] and fax referral to [facility]. Received call from [facility] after hours team, they will be able to process referral for home oxygen and will have a portable tank delivered to the hospital  tonight. CM updated patient and family, who were agreeable with discharge plan, however CM unable to provide an ETA.  [facility] will contact patient's daughter Osborne Oman [phone] when delivery person is on the way the way. Updated Tiffany RN on floor. Discussed HH and choice given patient selected [facility] Referral faxed into via [facility]. All this information has been placed on patient's AVS no further questions or concerns verbalized teach back done.    Expected Discharge Date:  08/28/16               Expected Discharge Plan:  [place]  In-House Referral:     Discharge planning Services  CM Consult  Post Acute Care Choice:    Choice offered to:  Patient, Adult Children  DME Arranged:  Oxygen, Walker rolling DME Agency:  [place]:  RN, Disease Management, PT, Nurse's Aide [place] Agency:  [facility] (now [facility])  Status of Service:  Completed, signed off  If discussed at [place] of Stay Meetings, dates discussed:    Additional CommentsLaurena Slimmer, RN 08/28/2016, 6:28 PM

## 2016-08-28 NOTE — Progress Notes (Signed)
Physical Therapy Treatment Patient Details Name: Maria Simon MRN: [id number] DOB: 18-Mar-1933 Today's Date: 08/28/2016    History of Present Illness Pt is a 83 yo female admitted with c/o SoB dx acute respiratory failure with hypoxia and acute on chronic systolic heart failure. PMH for COPD, HTN, thoracic aortic aneurysm status post endograft repair in 4315, systolic heart failure with last echo 2011 EF 10%, ongoing tobacco abuse and clinical diagnosis of mild emphysema.    PT Comments    Pt is making progress towards her goals. Pt currently supervision for bed mobility, minA for transfers with HHA, or min guard for transfers to RW and minA for ambulation with HHA or min guard for ambulation of 50 feet with RW, minA for ascend/descend of 5 stairs with rail. Pt encouraged to maintain her independence with utilizing RW, pt felt more secure with using RW after training. Pt continues to require 2L O2 via nasal cannula with activity to maintain SaO2 greater than 90%O2. Pt requires skilled PT in the acute setting to progress gait training with RW and to maintain strength and balance to safely navigate in her home environment at discharge.   Follow Up Recommendations  Home health PT;Supervision/Assistance - 24 hour     Equipment Recommendations  Rolling walker with 5" wheels       Precautions / Restrictions Precautions Precautions: Fall Restrictions Weight Bearing Restrictions: No    Mobility  Bed Mobility Overal bed mobility: Needs Assistance Bed Mobility: Supine to Sit     Supine to sit: HOB elevated;Supervision     General bed mobility comments: supervision for safety  Transfers Overall transfer level: Needs assistance Equipment used: None Transfers: Sit to/from Stand Sit to Stand: Min guard         General transfer comment: min guard for safety, held onto bedside table to steady herself  Ambulation/Gait Ambulation/Gait assistance: Min assist Ambulation Distance  (Feet): 250 Feet Assistive device: 1 person hand held assist;Rolling walker (2 wheeled) Gait Pattern/deviations: Decreased step length - right;Decreased step length - left;Step-through pattern;Trunk flexed;Shuffle Gait velocity: slowed Gait velocity interpretation: Below normal speed for age/gender General Gait Details: pt ambulated 200 feet with min HHA, for steadying, pt then ambulated 50 feet with RW and min guard  for safety, pt was able to improve her stability and cadence with the RW.PT recommended pt use RW at home and pt agreed it felt steadier.   Stairs Stairs: Yes   Stair Management: One rail Left;Alternating pattern;Forwards Number of Stairs: 5 General stair comments: pt reluctant to complete stair training because she normally gets so SoB with climbing stairs, with 2L O2 via nasal cannula pt able to maintain SaO2 at 90% with stair training    Balance Overall balance assessment: Needs assistance Sitting-balance support: No upper extremity supported;Feet supported Sitting balance-Leahy Scale: Good     Standing balance support: Single extremity supported Standing balance-Leahy Scale: Fair Standing balance comment: requires HHA or RW for steading with upright                            Cognition Arousal/Alertness: Awake/alert Behavior During Therapy: WFL for tasks assessed/performed Overall Cognitive Status: Within Functional Limits for tasks assessed                                           General Comments General comments (  skin integrity, edema, etc.): at rest on 2L via nasal cannula SaO2 91%, on RA SaO2 89%O2, with ambulation on RA SaO2 83% O2 with pursed lipped breathinga ble to increase to 87%O2, ambulation on 2L O2 via nasal cannula 90% O2        Pertinent Vitals/Pain Pain Assessment: No/denies pain           PT Goals (current goals can now be found in the care plan section) Acute Rehab PT Goals Patient Stated Goal: go home PT  Goal Formulation: With patient Time For Goal Achievement: 09/01/16    Frequency    Min 3X/week      PT Plan Current plan remains appropriate    Co-evaluation              AM-PAC PT "6 Clicks" Daily Activity  Outcome Measure  Difficulty turning over in bed (including adjusting bedclothes, sheets and blankets)?: A Little Difficulty moving from lying on back to sitting on the side of the bed? : A Little Difficulty sitting down on and standing up from a chair with arms (e.g., wheelchair, bedside commode, etc,.)?: A Little Help needed moving to and from a bed to chair (including a wheelchair)?: None Help needed walking in hospital room?: A Little Help needed climbing 3-5 steps with a railing? : A Little 6 Click Score: 19    End of Session Equipment Utilized During Treatment: Gait belt Activity Tolerance: Patient limited by fatigue;Other (comment) Patient left: in bed;with call bell/phone within reach;with bed alarm set Nurse Communication: Mobility status PT Visit Diagnosis: Unsteadiness on feet (R26.81);Other abnormalities of gait and mobility (R26.89);Muscle weakness (generalized) (M62.81);Difficulty in walking, not elsewhere classified (R26.2)     Time: 1610-9604 PT Time Calculation (min) (ACUTE ONLY): 28 min  Charges:  $Gait Training: 23-37 mins                    G Codes:       Princes Finger B. Migdalia Dk PT, DPT Acute Rehabilitation  [phone] Pager [phone]     [place] 08/28/2016, 3:20 PM

## 2016-08-28 NOTE — Discharge Instructions (Signed)

## 2016-08-28 NOTE — Progress Notes (Signed)
Patient discharged: Home with family  Via: Wheelchair   Discharge paperwork given: to patient and family  Reviewed with teach back  IV and telemetry disconnected  Belongings given to patient. Patient discharged with  Portable oxygen.  Jenisse Vullo, RN

## 2016-08-28 NOTE — Progress Notes (Signed)
RN called CM on call to get home o2 for pt.  CM with a pt at the time and will call RN back.

## 2016-08-28 NOTE — Progress Notes (Signed)
SATURATION QUALIFICATIONS: (This note is used to comply with regulatory documentation for home oxygen)  Patient Saturations on Room Air at Rest = 89%  Patient Saturations on Room Air while Ambulating = 83%  Patient Saturations on 2 Liters of oxygen while Ambulating = 90%  Please briefly explain why patient needs home oxygen: SaO2 continues to drop below 90% O2 on RA with activities like ambulation and ascent/descent of stairs.

## 2016-08-29 ENCOUNTER — Telehealth: Payer: Self-pay | Admitting: *Deleted

## 2016-08-29 NOTE — Telephone Encounter (Signed)
Spoke to pt who scheduled f/u appt 7/6, but did not want to complete TCM questionnaire, as she "doesnt feel up to it today."

## 2016-08-30 NOTE — Telephone Encounter (Signed)
noted 

## 2016-09-01 ENCOUNTER — Encounter: Payer: Self-pay | Admitting: Family Medicine

## 2016-09-01 ENCOUNTER — Ambulatory Visit (INDEPENDENT_AMBULATORY_CARE_PROVIDER_SITE_OTHER): Payer: Medicare Other | Admitting: Family Medicine

## 2016-09-01 VITALS — BP 100/70 | HR 93 | Temp 98.5°F | Wt 114.0 lb

## 2016-09-01 DIAGNOSIS — J209 Acute bronchitis, unspecified: Secondary | ICD-10-CM

## 2016-09-01 DIAGNOSIS — I712 Thoracic aortic aneurysm, without rupture, unspecified: Secondary | ICD-10-CM

## 2016-09-01 DIAGNOSIS — Z87891 Personal history of nicotine dependence: Secondary | ICD-10-CM

## 2016-09-01 DIAGNOSIS — I5032 Chronic diastolic (congestive) heart failure: Secondary | ICD-10-CM | POA: Diagnosis not present

## 2016-09-01 DIAGNOSIS — J432 Centrilobular emphysema: Secondary | ICD-10-CM | POA: Diagnosis not present

## 2016-09-01 DIAGNOSIS — J9601 Acute respiratory failure with hypoxia: Secondary | ICD-10-CM | POA: Diagnosis not present

## 2016-09-01 DIAGNOSIS — J440 Chronic obstructive pulmonary disease with acute lower respiratory infection: Secondary | ICD-10-CM | POA: Diagnosis not present

## 2016-09-01 DIAGNOSIS — N2889 Other specified disorders of kidney and ureter: Secondary | ICD-10-CM | POA: Insufficient documentation

## 2016-09-01 DIAGNOSIS — I10 Essential (primary) hypertension: Secondary | ICD-10-CM | POA: Diagnosis not present

## 2016-09-01 DIAGNOSIS — N289 Disorder of kidney and ureter, unspecified: Secondary | ICD-10-CM | POA: Diagnosis not present

## 2016-09-01 LAB — POC URINALSYSI DIPSTICK (AUTOMATED)
Bilirubin, UA: NEGATIVE
Glucose, UA: NEGATIVE
KETONES UA: NEGATIVE
Leukocytes, UA: NEGATIVE
Nitrite, UA: NEGATIVE
PH UA: 6 (ref 5.0–8.0)
PROTEIN UA: NEGATIVE
RBC UA: 1
SPEC GRAV UA: 1.015 (ref 1.010–1.025)
UROBILINOGEN UA: 0.2 U/dL

## 2016-09-01 MED ORDER — ALBUTEROL SULFATE 108 (90 BASE) MCG/ACT IN AEPB: 2.0000 | INHALATION_SPRAY | Freq: Four times a day (QID) | RESPIRATORY_TRACT | 3 refills | Status: DC | PRN

## 2016-09-01 NOTE — Assessment & Plan Note (Signed)
Treated with lasix x1 in hospital, seems euvolemic today.

## 2016-09-01 NOTE — Assessment & Plan Note (Signed)
Low readings recently - will stop HCTZ. Reassess at f/u visit 3 wks, consider updated labs

## 2016-09-01 NOTE — Assessment & Plan Note (Signed)
Stable on rpt CTA 08/2016

## 2016-09-01 NOTE — Addendum Note (Signed)
Addended by: Mady Haagensen on: 09/01/2016 10:02 AM   Modules accepted: Orders

## 2016-09-01 NOTE — Progress Notes (Signed)
Pre visit review using our clinic review tool, if applicable. No additional management support is needed unless otherwise documented below in the visit note. 

## 2016-09-01 NOTE — Patient Instructions (Addendum)
I will send in albuterol rescue inhaler to have on hand.  Stop hydrochlorothiazide water pill. Continue other blood pressure medicines. Keep an eye on blood pressures.  Congratulations on quitting smoking! Urinalysis today.  Good to see you today. Return in 3 week for follow up visit.

## 2016-09-01 NOTE — Assessment & Plan Note (Signed)
Exophytic lesion L kidney by CTA. Reviewed with patient. Discussed MRI vs [place]. Pt opts to monitor for now, wants to reassess when feeling better. Check UA today.

## 2016-09-01 NOTE — Assessment & Plan Note (Signed)
Continue supplemental O2 for now. Check ambulatory pulse ox when she returns

## 2016-09-01 NOTE — Assessment & Plan Note (Addendum)
Moderate to severe. Recent COPD exacerbation after rhinovirus infection necessitating hospitalization.  New oxygen requirement. Continue supplemental O2 use for now, check ambulatory pulse ox when returns in 3 weeks.  Congratulated on smoking cessation to date.  New COPD regimen is pulmicort neb BID, xopenex neb or alb inhaler PRN.

## 2016-09-01 NOTE — Assessment & Plan Note (Signed)
Rhinovirus bronchitis precipitating COPD exacerbation with new oxygen requirement. Continues recovering slowly.

## 2016-09-01 NOTE — Assessment & Plan Note (Deleted)
Quit 08/2016 after recent hospitalization. Congratulated and encouraged perseverance

## 2016-09-01 NOTE — Assessment & Plan Note (Signed)
Quit 08/2016 after recent hospitalization. Congratulated and encouraged perseverance

## 2016-09-01 NOTE — Progress Notes (Signed)
BP 100/70   Pulse 93   Temp 98.5 F (36.9 C)   Wt 114 lb (51.7 kg)   SpO2 96% Comment: 2LNC  BMI 20.85 kg/m    CC: hosp f/u visit Subjective:    Patient ID: Maria Simon, female    DOB: 09-14-1933, 83 y.o.   MRN: [id number]  HPI: Maria Simon is a 83 y.o. female presenting on 09/01/2016 for Hospitalization Follow-up   Here with daughter.   Recent hospitalization for acute respiratory failure with hypoxia, found to have COPD exacerbation as well as acute on chronic dCHF in setting of rhinovirus respiratory infection. New oxygen requirement, treated with levaquin course, prednisone taper. Started on bronchodilators.   Imaging showed moderately severe centrilobular pulmonary emphysema.  Continues coughing, but this is loosening up. Stays dyspneic with exertion. Mild wheezing. She is producing grey mucous. Denies fevers/chills, chest pain. Blood pressures running low but patient denies dizziness or lightheadedness.   Still waiting on [facility] set up.  Echocardiogram in hospital - EF 20-94%, grade 1 diastolic dysfunction, nrmal wall motion, increased pulm artery systolic pressures.   Known remote thoracic aortic aneurysm repair lost to f/u, pt had previously declined return to [facility]. CTA was obtained in hospital, showing stable graft compared to prior CTA 06/2016. Of note, potential exophytic lesion seen on L kidney - rec f/u with contrasted MRI of abdomen.   Admit date: 08/22/2016 Discharge date: 08/28/2016 TCM hosp f/u phone call attempted 08/29/2016, pt declined  Recommendations for Outpatient Follow-up:  1. Pt will need to follow up with PCP in 1-2 weeks post discharge 2. Please obtain BMP to evaluate electrolytes and kidney function 3. Please also check CBC to evaluate Hg and Hct levels 4. Please note that pt was discharged on home oxygen 5. Consider referral to pulmonologist for further follow up on this newly diagnosed COPD 6. Please also note that pt was given home nebulizer  machine to use nebs as needed   On CT Scan, An incidental finding of potential clinical significance has been found. Borderline enhancement of an intermediate attenuation lesion arising exophytic from the upper pole of the left kidney. This could represent a low grade renal neoplasm such as papillary carcinoma. Recommend further evaluation with contrasted MRI of the abdomen. Please note that per daughter request this was not discussed with patient until follow up studies done and conform the concern above.   Discharge Diagnoses:  Principal Problem:   Acute respiratory failure with hypoxia (HCC) Active Problems:   Acute on chronic systolic heart failure (HCC)   COPD with acute bronchitis (HCC)   Abnormal EKG   Hypertension   History of endograft repair of thoracic aortic aneurysm (2005)   Dyslipidemia   Acute hypokalemia   Thrombocytopenia (HCC)   Tobacco abuse  Discharge Condition: Stable Diet recommendation: Heart healthy diet discussed in details   Relevant past medical, surgical, family and social history reviewed and updated as indicated. Interim medical history since our last visit reviewed. Allergies and medications reviewed and updated. Outpatient Medications Prior to Visit  Medication Sig Dispense Refill  . aspirin 81 MG tablet Take 81 mg by mouth daily.     Marland Kitchen atorvastatin (LIPITOR) 80 MG tablet TAKE 1 TABLET BY MOUTH EVERY DAY 90 tablet 3  . benzonatate (TESSALON) 100 MG capsule Take 1 capsule (100 mg total) by mouth 2 (two) times daily as needed for cough. 20 capsule 0  . bisacodyl (DULCOLAX) 5 MG EC tablet Take 1 tablet (5 mg total)  by mouth daily as needed for moderate constipation. 30 tablet 0  . budesonide (PULMICORT) 0.25 MG/2ML nebulizer solution Take 2 mLs (0.25 mg total) by nebulization 2 (two) times daily. 60 mL 1  . Calcium Carb-Cholecalciferol (CALCIUM-VITAMIN D) 600-400 MG-UNIT TABS Take 1 tablet by mouth every other day.     . enalapril (VASOTEC) 10 MG  tablet TAKE 1 TABLET BY MOUTH EVERY DAY 90 tablet 3  . guaiFENesin (MUCINEX) 600 MG 12 hr tablet Take 1 tablet (600 mg total) by mouth 2 (two) times daily.    Marland Kitchen levalbuterol (XOPENEX) 1.25 MG/0.5ML nebulizer solution Take 1.25 mg by nebulization every 6 (six) hours as needed for wheezing or shortness of breath. 1 each 1  . metoprolol (LOPRESSOR) 50 MG tablet TAKE 1 TABLET BY MOUTH TWICE DAILY 180 tablet 1  . Multiple Vitamin (MULTIVITAMIN) tablet Take 1 tablet by mouth daily. Centrum Silver    . hydrochlorothiazide (MICROZIDE) 12.5 MG capsule TAKE 1 CAPSULE BY MOUTH EVERY DAY 90 capsule 3   No facility-administered medications prior to visit.      Per HPI unless specifically indicated in ROS section below Review of Systems     Objective:    BP 100/70   Pulse 93   Temp 98.5 F (36.9 C)   Wt 114 lb (51.7 kg)   SpO2 96% Comment: 2LNC  BMI 20.85 kg/m   Wt Readings from Last 3 Encounters:  09/01/16 114 lb (51.7 kg)  08/28/16 115 lb 8 oz (52.4 kg)  06/16/16 118 lb 12 oz (53.9 kg)    Physical Exam  Constitutional: She appears well-developed and well-nourished. No distress.  Frail, supplemental O2 in place by [place]  Cardiovascular: Normal rate, regular rhythm, normal heart sounds and intact distal pulses.   No murmur heard. Pulmonary/Chest: Effort normal and breath sounds normal. No respiratory distress. She has no wheezes. She has no rales.  Lungs overall clear, coarse some  Musculoskeletal: She exhibits no edema.  Skin: There is pallor.  Nursing note and vitals reviewed.  Results for orders placed or performed during the hospital encounter of 08/22/16  Respiratory Panel by PCR  Result Value Ref Range   Adenovirus NOT DETECTED NOT DETECTED   Coronavirus 229E NOT DETECTED NOT DETECTED   Coronavirus HKU1 NOT DETECTED NOT DETECTED   Coronavirus NL63 NOT DETECTED NOT DETECTED   Coronavirus OC43 NOT DETECTED NOT DETECTED   Metapneumovirus NOT DETECTED NOT DETECTED   Rhinovirus /  Enterovirus DETECTED (A) NOT DETECTED   Influenza A NOT DETECTED NOT DETECTED   Influenza B NOT DETECTED NOT DETECTED   Parainfluenza Virus 1 NOT DETECTED NOT DETECTED   Parainfluenza Virus 2 NOT DETECTED NOT DETECTED   Parainfluenza Virus 3 NOT DETECTED NOT DETECTED   Parainfluenza Virus 4 NOT DETECTED NOT DETECTED   Respiratory Syncytial Virus NOT DETECTED NOT DETECTED   Bordetella pertussis NOT DETECTED NOT DETECTED   Chlamydophila pneumoniae NOT DETECTED NOT DETECTED   Mycoplasma pneumoniae NOT DETECTED NOT DETECTED  Culture, blood (routine x 2) Call MD if unable to obtain prior to antibiotics being given  Result Value Ref Range   Specimen Description BLOOD RIGHT FOREARM    Special Requests      BOTTLES DRAWN AEROBIC AND ANAEROBIC Blood Culture adequate volume   Culture NO GROWTH 5 DAYS    Report Status 08/27/2016 FINAL   Culture, blood (routine x 2) Call MD if unable to obtain prior to antibiotics being given  Result Value Ref Range   Specimen Description  BLOOD RIGHT ARM    Special Requests      BOTTLES DRAWN AEROBIC AND ANAEROBIC Blood Culture adequate volume   Culture NO GROWTH 5 DAYS    Report Status 08/27/2016 FINAL   Comprehensive metabolic panel  Result Value Ref Range   Sodium 141 135 - 145 mmol/L   Potassium 3.5 3.5 - 5.1 mmol/L   Chloride 100 (L) 101 - 111 mmol/L   CO2 30 22 - 32 mmol/L   Glucose, Bld 132 (H) 65 - 99 mg/dL   BUN 10 6 - 20 mg/dL   Creatinine, Ser 0.82 0.44 - 1.00 mg/dL   Calcium 10.0 8.9 - 10.3 mg/dL   Total Protein 6.8 6.5 - 8.1 g/dL   Albumin 3.6 3.5 - 5.0 g/dL   AST 33 15 - 41 U/L   ALT 19 14 - 54 U/L   Alkaline Phosphatase 75 38 - 126 U/L   Total Bilirubin 0.9 0.3 - 1.2 mg/dL   GFR calc non Af Amer >60 >60 mL/min   GFR calc Af Amer >60 >60 mL/min   Anion gap 11 5 - 15  CBC with Differential  Result Value Ref Range   WBC 5.4 4.0 - 10.5 K/uL   RBC 5.06 3.87 - 5.11 MIL/uL   Hemoglobin 13.6 12.0 - 15.0 g/dL   HCT 42.9 36.0 - 46.0 %   MCV  84.8 78.0 - 100.0 fL   MCH 26.9 26.0 - 34.0 pg   MCHC 31.7 30.0 - 36.0 g/dL   RDW 15.5 11.5 - 15.5 %   Platelets 137 (L) 150 - 400 K/uL   Neutrophils Relative % 63 %   Neutro Abs 3.4 1.7 - 7.7 K/uL   Lymphocytes Relative 28 %   Lymphs Abs 1.5 0.7 - 4.0 K/uL   Monocytes Relative 8 %   Monocytes Absolute 0.4 0.1 - 1.0 K/uL   Eosinophils Relative 1 %   Eosinophils Absolute 0.0 0.0 - 0.7 K/uL   Basophils Relative 0 %   Basophils Absolute 0.0 0.0 - 0.1 K/uL  Brain natriuretic peptide  Result Value Ref Range   B Natriuretic Peptide 188.5 (H) 0.0 - 100.0 pg/mL  HIV antibody  Result Value Ref Range   HIV Screen 4th Generation wRfx Non Reactive Non Reactive  Strep pneumoniae urinary antigen  Result Value Ref Range   Strep Pneumo Urinary Antigen NEGATIVE NEGATIVE  Troponin I (q 6hr x 3)  Result Value Ref Range   Troponin I <0.03 <0.03 ng/mL  Troponin I (q 6hr x 3)  Result Value Ref Range   Troponin I 0.06 (HH) <0.03 ng/mL  Procalcitonin  Result Value Ref Range   Procalcitonin <0.10 ng/mL  Troponin I  Result Value Ref Range   Troponin I <0.03 <0.03 ng/mL  Basic metabolic panel  Result Value Ref Range   Sodium 137 135 - 145 mmol/L   Potassium 4.2 3.5 - 5.1 mmol/L   Chloride 100 (L) 101 - 111 mmol/L   CO2 29 22 - 32 mmol/L   Glucose, Bld 150 (H) 65 - 99 mg/dL   BUN 16 6 - 20 mg/dL   Creatinine, Ser 1.04 (H) 0.44 - 1.00 mg/dL   Calcium 10.2 8.9 - 10.3 mg/dL   GFR calc non Af Amer 49 (L) >60 mL/min   GFR calc Af Amer 57 (L) >60 mL/min   Anion gap 8 5 - 15  Troponin I  Result Value Ref Range   Troponin I 0.09 (HH) <0.03 ng/mL  Troponin I  Result Value Ref Range   Troponin I 0.09 (HH) <0.03 ng/mL  Basic metabolic panel  Result Value Ref Range   Sodium 138 135 - 145 mmol/L   Potassium 4.3 3.5 - 5.1 mmol/L   Chloride 100 (L) 101 - 111 mmol/L   CO2 31 22 - 32 mmol/L   Glucose, Bld 123 (H) 65 - 99 mg/dL   BUN 23 (H) 6 - 20 mg/dL   Creatinine, Ser 0.94 0.44 - 1.00 mg/dL    Calcium 10.0 8.9 - 10.3 mg/dL   GFR calc non Af Amer 56 (L) >60 mL/min   GFR calc Af Amer >60 >60 mL/min   Anion gap 7 5 - 15  Basic metabolic panel  Result Value Ref Range   Sodium 139 135 - 145 mmol/L   Potassium 4.1 3.5 - 5.1 mmol/L   Chloride 102 101 - 111 mmol/L   CO2 31 22 - 32 mmol/L   Glucose, Bld 99 65 - 99 mg/dL   BUN 21 (H) 6 - 20 mg/dL   Creatinine, Ser 0.93 0.44 - 1.00 mg/dL   Calcium 9.8 8.9 - 10.3 mg/dL   GFR calc non Af Amer 57 (L) >60 mL/min   GFR calc Af Amer >60 >60 mL/min   Anion gap 6 5 - 15  Basic metabolic panel  Result Value Ref Range   Sodium 137 135 - 145 mmol/L   Potassium 4.0 3.5 - 5.1 mmol/L   Chloride 102 101 - 111 mmol/L   CO2 30 22 - 32 mmol/L   Glucose, Bld 102 (H) 65 - 99 mg/dL   BUN 26 (H) 6 - 20 mg/dL   Creatinine, Ser 0.85 0.44 - 1.00 mg/dL   Calcium 9.5 8.9 - 10.3 mg/dL   GFR calc non Af Amer >60 >60 mL/min   GFR calc Af Amer >60 >60 mL/min   Anion gap 5 5 - 15  CBC  Result Value Ref Range   WBC 9.5 4.0 - 10.5 K/uL   RBC 4.77 3.87 - 5.11 MIL/uL   Hemoglobin 12.5 12.0 - 15.0 g/dL   HCT 40.6 36.0 - 46.0 %   MCV 85.1 78.0 - 100.0 fL   MCH 26.2 26.0 - 34.0 pg   MCHC 30.8 30.0 - 36.0 g/dL   RDW 15.7 (H) 11.5 - 15.5 %   Platelets 185 150 - 400 K/uL  I-Stat Chem 8, ED  Result Value Ref Range   Sodium 140 135 - 145 mmol/L   Potassium 3.4 (L) 3.5 - 5.1 mmol/L   Chloride 98 (L) 101 - 111 mmol/L   BUN 13 6 - 20 mg/dL   Creatinine, Ser 0.70 0.44 - 1.00 mg/dL   Glucose, Bld 132 (H) 65 - 99 mg/dL   Calcium, Ion 1.26 1.15 - 1.40 mmol/L   TCO2 32 0 - 100 mmol/L   Hemoglobin 14.6 12.0 - 15.0 g/dL   HCT 43.0 36.0 - 46.0 %  I-Stat Troponin, ED (not at [facility])  Result Value Ref Range   Troponin i, poc 0.00 0.00 - 0.08 ng/mL   Comment 3          ECHOCARDIOGRAM COMPLETE  Result Value Ref Range   Weight 1,819.2 oz   Height 62 in   BP 80/48 mmHg      Assessment & Plan:   Problem List Items Addressed This Visit    Acute respiratory failure  with hypoxia (HCC)    Continue supplemental O2 for now. Check ambulatory pulse ox when she  returns      CHF (congestive heart failure) (HCC)    Treated with lasix x1 in hospital, seems euvolemic today.       COPD with acute bronchitis ([place]) - Primary    Rhinovirus bronchitis precipitating COPD exacerbation with new oxygen requirement. Continues recovering slowly.       Relevant Medications   Albuterol Sulfate (PROAIR RESPICLICK) 174 (90 Base) MCG/ACT AEPB   Emphysema of lung (HCC)    Moderate to severe. Recent COPD exacerbation after rhinovirus infection necessitating hospitalization.  New oxygen requirement. Continue supplemental O2 use for now, check ambulatory pulse ox when returns in 3 weeks.  Congratulated on smoking cessation to date.  New COPD regimen is pulmicort neb BID, xopenex neb or alb inhaler PRN.       Relevant Medications   Albuterol Sulfate (PROAIR RESPICLICK) 081 (90 Base) MCG/ACT AEPB   Ex-smoker    Quit 08/2016 after recent hospitalization. Congratulated and encouraged perseverance       HTN (hypertension)    Low readings recently - will stop HCTZ. Reassess at f/u visit 3 wks, consider updated labs      Lesion of left native kidney    Exophytic lesion L kidney by CTA. Reviewed with patient. Discussed MRI vs [place]. Pt opts to monitor for now, wants to reassess when feeling better. Check UA today.       Thoracic aortic aneurysm without rupture ([place])    Stable on rpt CTA 08/2016          Follow up plan: Return in about 3 weeks (around 09/22/2016) for follow up visit.  Ria Bush, MD

## 2016-09-02 DIAGNOSIS — M810 Age-related osteoporosis without current pathological fracture: Secondary | ICD-10-CM | POA: Diagnosis not present

## 2016-09-02 DIAGNOSIS — I5042 Chronic combined systolic (congestive) and diastolic (congestive) heart failure: Secondary | ICD-10-CM | POA: Diagnosis not present

## 2016-09-02 DIAGNOSIS — I110 Hypertensive heart disease with heart failure: Secondary | ICD-10-CM | POA: Diagnosis not present

## 2016-09-02 DIAGNOSIS — I2510 Atherosclerotic heart disease of native coronary artery without angina pectoris: Secondary | ICD-10-CM | POA: Diagnosis not present

## 2016-09-02 DIAGNOSIS — I70209 Unspecified atherosclerosis of native arteries of extremities, unspecified extremity: Secondary | ICD-10-CM | POA: Diagnosis not present

## 2016-09-02 DIAGNOSIS — J441 Chronic obstructive pulmonary disease with (acute) exacerbation: Secondary | ICD-10-CM | POA: Diagnosis not present

## 2016-09-04 ENCOUNTER — Other Ambulatory Visit: Payer: Self-pay | Admitting: *Deleted

## 2016-09-04 NOTE — Patient Outreach (Signed)
[facility]) Care Management  09/04/2016  Maria Simon 04/24/1933 [id number]   EMMI-Pneumonia RED ON EMMI ALERT DAY#: 3 DATE: 09/02/16 RED ALERT: Feeling better overall? NO More short of breath than yesterday? Yes  Spoke with patient. Reviewed and addressed red alert. HIPAA verified with patient. According to the patient, she wasn't diagnosed with Pneumonia. She confirmed having respiratory complications. She continues to have coughing, however she is "doing well". She is "moving along as good as she can". She "feels her health is improving". Patient is active with [facility] (RN/PT). Patient reported, she started weighing daily, based on home heath RN recommendations. She communicated being able to understand her discharge paperwork. Patient able to afford and taking her medications as prescribed.  Patient's discharge follow-up appointment was 09/01/16.   Plan: RN CM will notify [facility] CM administrative assistant regarding case closure.   Lake Bells, RN, BSN, MHA/MSL, [place] Telephonic Care Manager Coordinator [facility] Direct Phone: [phone] Toll Free: [phone] Fax: [phone]

## 2016-09-05 ENCOUNTER — Telehealth: Payer: Self-pay

## 2016-09-05 DIAGNOSIS — I2510 Atherosclerotic heart disease of native coronary artery without angina pectoris: Secondary | ICD-10-CM | POA: Diagnosis not present

## 2016-09-05 DIAGNOSIS — I110 Hypertensive heart disease with heart failure: Secondary | ICD-10-CM | POA: Diagnosis not present

## 2016-09-05 DIAGNOSIS — M810 Age-related osteoporosis without current pathological fracture: Secondary | ICD-10-CM | POA: Diagnosis not present

## 2016-09-05 DIAGNOSIS — I70209 Unspecified atherosclerosis of native arteries of extremities, unspecified extremity: Secondary | ICD-10-CM | POA: Diagnosis not present

## 2016-09-05 DIAGNOSIS — J441 Chronic obstructive pulmonary disease with (acute) exacerbation: Secondary | ICD-10-CM | POA: Diagnosis not present

## 2016-09-05 DIAGNOSIS — I5042 Chronic combined systolic (congestive) and diastolic (congestive) heart failure: Secondary | ICD-10-CM | POA: Diagnosis not present

## 2016-09-05 NOTE — Telephone Encounter (Signed)
Lattie Haw RN with [facility] left v/m; wants to know if should continue mucinex bid or change to prn. Also do you want pt to keep oxygen on 2L continuous or do you want pt to wein down if gets up to 100%. Also request order for a breather. Lisa request cb.

## 2016-09-05 NOTE — Telephone Encounter (Signed)
Left message on voicemail.

## 2016-09-05 NOTE — Telephone Encounter (Signed)
Would change mucinex to PRN. May attempt oxygen wean at home.  What's a breather? Do they mean spacer? Ok to do.

## 2016-09-07 ENCOUNTER — Other Ambulatory Visit: Payer: Self-pay | Admitting: *Deleted

## 2016-09-07 DIAGNOSIS — I110 Hypertensive heart disease with heart failure: Secondary | ICD-10-CM | POA: Diagnosis not present

## 2016-09-07 DIAGNOSIS — I5042 Chronic combined systolic (congestive) and diastolic (congestive) heart failure: Secondary | ICD-10-CM | POA: Diagnosis not present

## 2016-09-07 DIAGNOSIS — J441 Chronic obstructive pulmonary disease with (acute) exacerbation: Secondary | ICD-10-CM | POA: Diagnosis not present

## 2016-09-07 DIAGNOSIS — I70209 Unspecified atherosclerosis of native arteries of extremities, unspecified extremity: Secondary | ICD-10-CM | POA: Diagnosis not present

## 2016-09-07 DIAGNOSIS — I2510 Atherosclerotic heart disease of native coronary artery without angina pectoris: Secondary | ICD-10-CM | POA: Diagnosis not present

## 2016-09-07 DIAGNOSIS — M810 Age-related osteoporosis without current pathological fracture: Secondary | ICD-10-CM | POA: Diagnosis not present

## 2016-09-07 NOTE — Patient Outreach (Signed)
[facility]) Care Management  09/05/16  Maria Simon [birth year] [id number]   EMMI-Pneumonia RED ON EMMI ALERT DAY#: 5 DATE: 09/04/16 RED ALERT: Feeling better overall? NO  Outreach attempt #1 to patient. No answer. RN CM left HIPAA compliant message along with contact info.    Plan: RN CM will contact patient within 1 business day.  Lake Bells, RN, BSN, MHA/MSL, [place] Telephonic Care Manager Coordinator [facility] Direct Phone: [phone] Toll Free: [phone] Fax: [phone]

## 2016-09-07 NOTE — Patient Outreach (Signed)
[facility]) Care Management  09/07/2016  Maria Simon [birth year] [id number]  EMMI-Pneumonia RED ON EMMI ALERT DAY#: 5 DATE: 09/04/16 RED ALERT: Feeling better overall? NO  Spoke with patient. Reviewed and addressed red alert. Outreach attempt # 2 spoke with patient regarding EMMI automated phone calls. HIPAA verified with patient. Patient reported having good days and bad days. She verbalized, "She hopes that she has more good days than bad". PT from [facility] has a planned visit with patient at 1 pm today. Home health RN has a scheduled visit on 09/08/16. Patient concerned about continued use of oxygen. She asked how long does she have to continue using the oxygen. RN CM [place]) discussed with the patient to speak with PT and RN to determine if she can be weaned from the oxygen and she agreed. Patient last PCP appointment was 09/01/16. Patient stated, the MD discontinued her HCTZ during the visit. Her next scheduled PCP appointment is scheduled for 3 weeks.   Advised patient that she would continue to get automated EMMI-PNA post discharge calls to assess how she is doing following recent hospitalization. Informed patient, she will receive a call from a nurse if any of her responses were abnormal. Patient voiced understanding and was appreciative of f/u call.  Plan: RN CM will notify [facility] CM administrative assistant regarding case closure.   Lake Bells, RN, BSN, MHA/MSL, [place] Telephonic Care Manager Coordinator [facility] Direct Phone: [phone] Toll Free: [phone] Fax: [phone]

## 2016-09-08 ENCOUNTER — Other Ambulatory Visit: Payer: Self-pay | Admitting: *Deleted

## 2016-09-08 ENCOUNTER — Ambulatory Visit: Payer: Medicare Other | Admitting: Family Medicine

## 2016-09-08 DIAGNOSIS — J441 Chronic obstructive pulmonary disease with (acute) exacerbation: Secondary | ICD-10-CM | POA: Diagnosis not present

## 2016-09-08 DIAGNOSIS — I2510 Atherosclerotic heart disease of native coronary artery without angina pectoris: Secondary | ICD-10-CM | POA: Diagnosis not present

## 2016-09-08 DIAGNOSIS — I5042 Chronic combined systolic (congestive) and diastolic (congestive) heart failure: Secondary | ICD-10-CM | POA: Diagnosis not present

## 2016-09-08 DIAGNOSIS — M810 Age-related osteoporosis without current pathological fracture: Secondary | ICD-10-CM | POA: Diagnosis not present

## 2016-09-08 DIAGNOSIS — I70209 Unspecified atherosclerosis of native arteries of extremities, unspecified extremity: Secondary | ICD-10-CM | POA: Diagnosis not present

## 2016-09-08 DIAGNOSIS — I110 Hypertensive heart disease with heart failure: Secondary | ICD-10-CM | POA: Diagnosis not present

## 2016-09-08 NOTE — Patient Outreach (Signed)
Patient triggered Red on EMMI COPD dashboard.  Notification sent to:  Lake Bells, RN

## 2016-09-08 NOTE — Patient Outreach (Addendum)
[facility]) Care Management  09/08/2016  Maria Simon 1933-05-20 [id number]  EMMI-COPD RED ON EMMI ALERT DAY#: 8 DATE: 09/07/16 RED ALERT: Feeling worse overall? Yes  Outreach # 1 spoke with patient regarding EMMI automated phone calls. HIPAA verified with patient. Patient reported, she is feeling better today. "She was not feeling well when she received the EMMI call yesterday. She starts to feel worse towards the evening hours. She feels drained, tired, and she continues to have a cough. Patient verbalized having a home health RN visit today. Patient stated, her vital signs were good. She remained off of her oxygen for 45 minutes today. She did not experience any shortness of breath. She is concerned how long can she remove the oxygen and maintain room air. Patient stated, the home health RN will contact her MD to determine if oxygen liter flow can be decreased.   Patient offered [facility] services, however she declined. She spoke about feeling better. She said, the home health services are enough, currently.  Plan: RN CM will notify [facility] CM administrative assistant regarding case closure.   Lake Bells, RN, BSN, MHA/MSL, [place] Telephonic Care Manager Coordinator [facility] Direct Phone: [phone] Toll Free: [phone] Fax: [phone]

## 2016-09-11 ENCOUNTER — Other Ambulatory Visit: Payer: Self-pay | Admitting: *Deleted

## 2016-09-11 NOTE — Patient Outreach (Signed)
[facility]) Care Management  09/11/2016  Maria Simon August 24, 1933 [id number]   EMMI-COPD RED ON EMMI ALERT DAY#: 41, 11 DATE: 09/09/16, 09/10/16 RED ALERT: Wheezing more than yesterday? Yes Had diarrhea or felt sick to stomach? Yes Number of times rescue inhaler used in past 24 hours? 8 Sad, hopeless, anxious, or empty? Yes  Outreach attempt # 1, spoke with patient. Reviewed and addressed red alerts. Patient stated, her daughter answered EMMI PNA questions. Patient reported, she is getting better a little every day. She is attempting to wean off of oxygen each day. She provided O2 saturations ranging in the upper 90s on RA. Patient continues to have a mild cough. She verbalized not using her respiratory medications as prescribed. After reviewing patient's discharge AVS in EPIC, re-educated patient on how medications were prescribed by MD. Patient stated, she would start taking her respiratory medication as ordered, tonight.  Patient informed to review her medications with the [facility] RN.  Educated patient on the importance of notifying her PCP if she has any questions or concerns. She agreed.    Plan: RN CM will call [facility] RN for care coordination. RN CM will notify [facility] CM administrative assistant regarding case closure.   Lake Bells, RN, BSN, MHA/MSL, [place] Telephonic Care Manager Coordinator [facility] Direct Phone: [phone] Toll Free: [phone] Fax: [phone]

## 2016-09-12 ENCOUNTER — Other Ambulatory Visit: Payer: Self-pay | Admitting: *Deleted

## 2016-09-12 DIAGNOSIS — I2510 Atherosclerotic heart disease of native coronary artery without angina pectoris: Secondary | ICD-10-CM | POA: Diagnosis not present

## 2016-09-12 DIAGNOSIS — I5042 Chronic combined systolic (congestive) and diastolic (congestive) heart failure: Secondary | ICD-10-CM | POA: Diagnosis not present

## 2016-09-12 DIAGNOSIS — J441 Chronic obstructive pulmonary disease with (acute) exacerbation: Secondary | ICD-10-CM | POA: Diagnosis not present

## 2016-09-12 DIAGNOSIS — I70209 Unspecified atherosclerosis of native arteries of extremities, unspecified extremity: Secondary | ICD-10-CM | POA: Diagnosis not present

## 2016-09-12 DIAGNOSIS — I110 Hypertensive heart disease with heart failure: Secondary | ICD-10-CM | POA: Diagnosis not present

## 2016-09-12 DIAGNOSIS — M810 Age-related osteoporosis without current pathological fracture: Secondary | ICD-10-CM | POA: Diagnosis not present

## 2016-09-12 NOTE — Telephone Encounter (Signed)
Left message on voicemail for Lattie Haw to call back.

## 2016-09-12 NOTE — Patient Outreach (Signed)
[facility]) Care Management  09/11/16 CONNELLY SPRUELL Sep 08, 1933 [id number]  Care Coordination  Phone call placed to [facility], spoke with nursing supervisor.Requested for a return phone call from [facility] RN Annamarie Dawley) in reference to patient.   Lake Bells, RN, BSN, MHA/MSL, [place] Telephonic Care Manager Coordinator [facility] Direct Phone: [phone] Toll Free: [phone] Fax: [phone]

## 2016-09-13 NOTE — Telephone Encounter (Signed)
Lm on Maria Simon's vm requesting a call back

## 2016-09-14 DIAGNOSIS — I70209 Unspecified atherosclerosis of native arteries of extremities, unspecified extremity: Secondary | ICD-10-CM | POA: Diagnosis not present

## 2016-09-14 DIAGNOSIS — I2510 Atherosclerotic heart disease of native coronary artery without angina pectoris: Secondary | ICD-10-CM | POA: Diagnosis not present

## 2016-09-14 DIAGNOSIS — I110 Hypertensive heart disease with heart failure: Secondary | ICD-10-CM | POA: Diagnosis not present

## 2016-09-14 DIAGNOSIS — M810 Age-related osteoporosis without current pathological fracture: Secondary | ICD-10-CM | POA: Diagnosis not present

## 2016-09-14 DIAGNOSIS — J441 Chronic obstructive pulmonary disease with (acute) exacerbation: Secondary | ICD-10-CM | POA: Diagnosis not present

## 2016-09-14 DIAGNOSIS — I5042 Chronic combined systolic (congestive) and diastolic (congestive) heart failure: Secondary | ICD-10-CM | POA: Diagnosis not present

## 2016-09-15 NOTE — Telephone Encounter (Signed)
See my prior note - ok to use breather per recommendations.

## 2016-09-15 NOTE — Telephone Encounter (Signed)
Left detailed message.   

## 2016-09-15 NOTE — Telephone Encounter (Signed)
Verbal order given to [facility].

## 2016-09-15 NOTE — Telephone Encounter (Signed)
Lattie Haw RN with [facility] left v/m; the breather is a device that will help pt to exercise the lungs and will allow the pt to breathe more effectively;it is not a spacer; Lattie Haw can order thru their supply co. Lattie Haw request cb with order.

## 2016-09-18 ENCOUNTER — Other Ambulatory Visit: Payer: Self-pay | Admitting: Family Medicine

## 2016-09-19 DIAGNOSIS — I2510 Atherosclerotic heart disease of native coronary artery without angina pectoris: Secondary | ICD-10-CM | POA: Diagnosis not present

## 2016-09-19 DIAGNOSIS — I110 Hypertensive heart disease with heart failure: Secondary | ICD-10-CM | POA: Diagnosis not present

## 2016-09-19 DIAGNOSIS — I70209 Unspecified atherosclerosis of native arteries of extremities, unspecified extremity: Secondary | ICD-10-CM | POA: Diagnosis not present

## 2016-09-19 DIAGNOSIS — M810 Age-related osteoporosis without current pathological fracture: Secondary | ICD-10-CM | POA: Diagnosis not present

## 2016-09-19 DIAGNOSIS — J441 Chronic obstructive pulmonary disease with (acute) exacerbation: Secondary | ICD-10-CM | POA: Diagnosis not present

## 2016-09-19 DIAGNOSIS — I5042 Chronic combined systolic (congestive) and diastolic (congestive) heart failure: Secondary | ICD-10-CM | POA: Diagnosis not present

## 2016-09-20 DIAGNOSIS — Z9181 History of falling: Secondary | ICD-10-CM | POA: Diagnosis not present

## 2016-09-20 DIAGNOSIS — F17210 Nicotine dependence, cigarettes, uncomplicated: Secondary | ICD-10-CM | POA: Diagnosis not present

## 2016-09-20 DIAGNOSIS — J441 Chronic obstructive pulmonary disease with (acute) exacerbation: Secondary | ICD-10-CM | POA: Diagnosis not present

## 2016-09-20 DIAGNOSIS — I2510 Atherosclerotic heart disease of native coronary artery without angina pectoris: Secondary | ICD-10-CM | POA: Diagnosis not present

## 2016-09-20 DIAGNOSIS — I5042 Chronic combined systolic (congestive) and diastolic (congestive) heart failure: Secondary | ICD-10-CM | POA: Diagnosis not present

## 2016-09-20 DIAGNOSIS — R7303 Prediabetes: Secondary | ICD-10-CM | POA: Diagnosis not present

## 2016-09-20 DIAGNOSIS — I70209 Unspecified atherosclerosis of native arteries of extremities, unspecified extremity: Secondary | ICD-10-CM | POA: Diagnosis not present

## 2016-09-20 DIAGNOSIS — Z7982 Long term (current) use of aspirin: Secondary | ICD-10-CM | POA: Diagnosis not present

## 2016-09-20 DIAGNOSIS — I110 Hypertensive heart disease with heart failure: Secondary | ICD-10-CM | POA: Diagnosis not present

## 2016-09-20 DIAGNOSIS — M810 Age-related osteoporosis without current pathological fracture: Secondary | ICD-10-CM | POA: Diagnosis not present

## 2016-09-21 DIAGNOSIS — I110 Hypertensive heart disease with heart failure: Secondary | ICD-10-CM | POA: Diagnosis not present

## 2016-09-21 DIAGNOSIS — J441 Chronic obstructive pulmonary disease with (acute) exacerbation: Secondary | ICD-10-CM | POA: Diagnosis not present

## 2016-09-21 DIAGNOSIS — I5042 Chronic combined systolic (congestive) and diastolic (congestive) heart failure: Secondary | ICD-10-CM | POA: Diagnosis not present

## 2016-09-21 DIAGNOSIS — I2510 Atherosclerotic heart disease of native coronary artery without angina pectoris: Secondary | ICD-10-CM | POA: Diagnosis not present

## 2016-09-21 DIAGNOSIS — I70209 Unspecified atherosclerosis of native arteries of extremities, unspecified extremity: Secondary | ICD-10-CM | POA: Diagnosis not present

## 2016-09-21 DIAGNOSIS — M810 Age-related osteoporosis without current pathological fracture: Secondary | ICD-10-CM | POA: Diagnosis not present

## 2016-09-22 ENCOUNTER — Encounter: Payer: Self-pay | Admitting: Family Medicine

## 2016-09-22 ENCOUNTER — Ambulatory Visit (INDEPENDENT_AMBULATORY_CARE_PROVIDER_SITE_OTHER): Payer: Medicare Other | Admitting: Family Medicine

## 2016-09-22 VITALS — BP 114/76 | HR 83 | Temp 97.6°F | Wt 115.8 lb

## 2016-09-22 DIAGNOSIS — I70209 Unspecified atherosclerosis of native arteries of extremities, unspecified extremity: Secondary | ICD-10-CM | POA: Diagnosis not present

## 2016-09-22 DIAGNOSIS — I2510 Atherosclerotic heart disease of native coronary artery without angina pectoris: Secondary | ICD-10-CM | POA: Diagnosis not present

## 2016-09-22 DIAGNOSIS — J432 Centrilobular emphysema: Secondary | ICD-10-CM

## 2016-09-22 DIAGNOSIS — Z87891 Personal history of nicotine dependence: Secondary | ICD-10-CM | POA: Diagnosis not present

## 2016-09-22 DIAGNOSIS — J441 Chronic obstructive pulmonary disease with (acute) exacerbation: Secondary | ICD-10-CM | POA: Diagnosis not present

## 2016-09-22 DIAGNOSIS — I10 Essential (primary) hypertension: Secondary | ICD-10-CM | POA: Diagnosis not present

## 2016-09-22 DIAGNOSIS — R5383 Other fatigue: Secondary | ICD-10-CM

## 2016-09-22 DIAGNOSIS — N289 Disorder of kidney and ureter, unspecified: Secondary | ICD-10-CM | POA: Diagnosis not present

## 2016-09-22 DIAGNOSIS — I5042 Chronic combined systolic (congestive) and diastolic (congestive) heart failure: Secondary | ICD-10-CM | POA: Diagnosis not present

## 2016-09-22 DIAGNOSIS — M810 Age-related osteoporosis without current pathological fracture: Secondary | ICD-10-CM | POA: Diagnosis not present

## 2016-09-22 DIAGNOSIS — I110 Hypertensive heart disease with heart failure: Secondary | ICD-10-CM | POA: Diagnosis not present

## 2016-09-22 LAB — BASIC METABOLIC PANEL
BUN: 14 mg/dL (ref 6–23)
CHLORIDE: 103 meq/L (ref 96–112)
CO2: 31 meq/L (ref 19–32)
CREATININE: 0.86 mg/dL (ref 0.40–1.20)
Calcium: 10.4 mg/dL (ref 8.4–10.5)
GFR: 67.32 mL/min (ref >60.00)
Glucose, Bld: 108 mg/dL — ABNORMAL HIGH (ref 70–99)
Potassium: 4.8 mEq/L (ref 3.5–5.1)
Sodium: 141 mEq/L (ref 135–145)

## 2016-09-22 LAB — VITAMIN B12: VITAMIN B 12: 1084 pg/mL — AB (ref 211–911)

## 2016-09-22 LAB — TSH: TSH: 1.82 u[IU]/mL (ref 0.35–4.50)

## 2016-09-22 NOTE — Assessment & Plan Note (Signed)
Continued cessation - congratulated.

## 2016-09-22 NOTE — Assessment & Plan Note (Addendum)
1.8 cm exophytic growth lower pole of left kidney incidentally found on CT. Discussed possible causes - including possible tumor/malignancy. Offered options of MRI as ideal study for further evaluation vs less ideal and less expensive [place] to start. Discussed if tumor found would likely recommend surgical removal.  Pt declines further imaging at this time, she is clear that she wants to avoid surgery at any cost. Wants to just watch things for now.  Discussed red flags to indicate need for further evaluation with [place] and labs (left flank pain, hematuria, urinary complaints, nausea, abdominal pain).  Pt and daughter expressed understanding and agree with plan.

## 2016-09-22 NOTE — Progress Notes (Signed)
BP 114/76 (BP Location: Right Arm, Patient Position: Sitting, Cuff Size: Normal)   Pulse 83   Temp 97.6 F (36.4 C) (Oral)   Wt 115 lb 12 oz (52.5 kg)   SpO2 97% Comment: RA  BMI 21.17 kg/m    CC: 3 mo f/u visit Subjective:    Patient ID: Maria Simon, female    DOB: January 08, 1934, 83 y.o.   MRN: [id number]  HPI: Maria Simon is a 83 y.o. female presenting on 09/22/2016 for Follow-up (3 week)   See prior note for details.  Seen here 3 wks ago after hospitalization with acute resp failure with hypoxia after COPD exacerbation and acute on chronic dCHF exacerbation in setting of rhinovirus bronchitis. She was placed on oxygen and started on bronchodilators. Imaging showed moderately severe centrilobular pulm emphysema. She quit smoking.  Due for ambulatory pulse ox today.  New COPD regimen is pulmicort neb BID, xopenex neb or alb inhaler PRN.  Brings log of blood pressures and oxygen which was reviewed - overall good.   HH involved.  Off oxygen since Tuesday. She doesn't use at night time. Maintaining good oxygenation.   Main concern is no energy. Sleeping ok. She does awaken feeling rested.   Known remote thoracic aortic aneurysm repair lost to f/u, pt had previously declined return to [facility]. CTA was obtained in hospital, showing stable graft compared to prior CTA 06/2016. Of note, potential exophytic lesion seen on L kidney - rec f/u with contrasted MRI of abdomen.   Relevant past medical, surgical, family and social history reviewed and updated as indicated. Interim medical history since our last visit reviewed. Allergies and medications reviewed and updated. Outpatient Medications Prior to Visit  Medication Sig Dispense Refill  . Albuterol Sulfate (PROAIR RESPICLICK) 829 (90 Base) MCG/ACT AEPB Inhale 2 puffs into the lungs every 6 (six) hours as needed. 1 each 3  . aspirin 81 MG tablet Take 81 mg by mouth daily.     Marland Kitchen atorvastatin (LIPITOR) 80 MG tablet TAKE 1 TABLET BY MOUTH  EVERY DAY 90 tablet 3  . bisacodyl (DULCOLAX) 5 MG EC tablet Take 1 tablet (5 mg total) by mouth daily as needed for moderate constipation. 30 tablet 0  . budesonide (PULMICORT) 0.25 MG/2ML nebulizer solution Take 2 mLs (0.25 mg total) by nebulization 2 (two) times daily. 60 mL 1  . Calcium Carb-Cholecalciferol (CALCIUM-VITAMIN D) 600-400 MG-UNIT TABS Take 1 tablet by mouth every other day.     . enalapril (VASOTEC) 10 MG tablet TAKE 1 TABLET BY MOUTH EVERY DAY 90 tablet 3  . levalbuterol (XOPENEX) 1.25 MG/0.5ML nebulizer solution Take 1.25 mg by nebulization every 6 (six) hours as needed for wheezing or shortness of breath. 1 each 1  . metoprolol tartrate (LOPRESSOR) 50 MG tablet TAKE 1 TABLET BY MOUTH TWICE DAILY 180 tablet 3  . Multiple Vitamin (MULTIVITAMIN) tablet Take 1 tablet by mouth daily. Centrum Silver    . guaiFENesin (MUCINEX) 600 MG 12 hr tablet Take 1 tablet (600 mg total) by mouth 2 (two) times daily.    . benzonatate (TESSALON) 100 MG capsule Take 1 capsule (100 mg total) by mouth 2 (two) times daily as needed for cough. (Patient not taking: Reported on 09/22/2016) 20 capsule 0   No facility-administered medications prior to visit.      Per HPI unless specifically indicated in ROS section below Review of Systems     Objective:    BP 114/76 (BP Location: Right Arm, Patient Position:  Sitting, Cuff Size: Normal)   Pulse 83   Temp 97.6 F (36.4 C) (Oral)   Wt 115 lb 12 oz (52.5 kg)   SpO2 97% Comment: RA  BMI 21.17 kg/m   Wt Readings from Last 3 Encounters:  09/22/16 115 lb 12 oz (52.5 kg)  09/01/16 114 lb (51.7 kg)  08/28/16 115 lb 8 oz (52.4 kg)    Physical Exam  Constitutional: She appears well-developed and well-nourished. No distress.  HENT:  Head: Normocephalic and atraumatic.  Mouth/Throat: Oropharynx is clear and moist. No oropharyngeal exudate.  Eyes: Pupils are equal, round, and reactive to light. Conjunctivae and EOM are normal.  Neck: Normal range of  motion. Neck supple. No thyromegaly present.  Cardiovascular: Normal rate, regular rhythm, normal heart sounds and intact distal pulses.   No murmur heard. Pulmonary/Chest: Effort normal and breath sounds normal. No respiratory distress. She has no wheezes. She has no rales.  Musculoskeletal: She exhibits no edema.  Lymphadenopathy:    She has no cervical adenopathy.  Skin: Skin is warm and dry. No rash noted.  Psychiatric: She has a normal mood and affect.  Nursing note and vitals reviewed.  Results for orders placed or performed in visit on 09/01/16  POCT Urinalysis Dipstick (Automated)  Result Value Ref Range   Color, UA YELLOW    Clarity, UA CLEAR    Glucose, UA NEG    Bilirubin, UA NEG    Ketones, UA NEG    Spec Grav, UA 1.015 1.010 - 1.025   Blood, UA 1    pH, UA 6.0 5.0 - 8.0   Protein, UA NEG    Urobilinogen, UA 0.2 0.2 or 1.0 E.U./dL   Nitrite, UA NEG    Leukocytes, UA Negative Negative      Assessment & Plan:   Problem List Items Addressed This Visit    Emphysema of lung ([place]) - Primary    Mod-severe. Discussed continue controller med pulmicort neb BID, with PRN albuterol.  Will check ambulatory pulse ox for home O2 need - if maintains O2 sat, ok to discontinue supplemental home oxygen. Pt hopes to stop oxygen.  Passed ambulatory pulse ox - ok to stop supplemental O2      Ex-smoker    Continued cessation - congratulated.       Fatigue    Chronic, ongoing. Check labs for reversible cause today.       Relevant Orders   TSH   Vitamin B12   HTN (hypertension)    Chronic, stable off hctz. Continue enalapril and metoprolol       Lesion of left native kidney    1.8 cm exophytic growth lower pole of left kidney incidentally found on CT. Discussed possible causes - including possible tumor/malignancy. Offered options of MRI as ideal study for further evaluation vs less ideal and less expensive [place] to start. Discussed if tumor found would likely recommend surgical  removal.  Pt declines further imaging at this time, she is clear that she wants to avoid surgery at any cost. Wants to just watch things for now.  Discussed red flags to indicate need for further evaluation with [place] and labs (left flank pain, hematuria, urinary complaints, nausea, abdominal pain).  Pt and daughter expressed understanding and agree with plan.       Relevant Orders   Basic metabolic panel       Follow up plan: Return in about 3 months (around 12/23/2016) for follow up visit.  Ria Bush, MD

## 2016-09-22 NOTE — Assessment & Plan Note (Addendum)
Mod-severe. Discussed continue controller med pulmicort neb BID, with PRN albuterol.  Will check ambulatory pulse ox for home O2 need - if maintains O2 sat, ok to discontinue supplemental home oxygen. Pt hopes to stop oxygen.  Passed ambulatory pulse ox - ok to stop supplemental O2

## 2016-09-22 NOTE — Patient Instructions (Addendum)
Ambulatory pulse ox today.  Labs today.  Continue pulmicort nebulizer twice daily as controller medication for breathing.  We will just watch left kidney growth for now- let me know if any left abdominal or flank pain or blood in urine or trouble voiding.  Return in 3-4 months for follow up visit.

## 2016-09-22 NOTE — Assessment & Plan Note (Signed)
Chronic, ongoing. Check labs for reversible cause today.

## 2016-09-22 NOTE — Assessment & Plan Note (Signed)
Chronic, stable off hctz. Continue enalapril and metoprolol

## 2016-09-25 DIAGNOSIS — M810 Age-related osteoporosis without current pathological fracture: Secondary | ICD-10-CM | POA: Diagnosis not present

## 2016-09-25 DIAGNOSIS — I5042 Chronic combined systolic (congestive) and diastolic (congestive) heart failure: Secondary | ICD-10-CM | POA: Diagnosis not present

## 2016-09-25 DIAGNOSIS — I70209 Unspecified atherosclerosis of native arteries of extremities, unspecified extremity: Secondary | ICD-10-CM | POA: Diagnosis not present

## 2016-09-25 DIAGNOSIS — J441 Chronic obstructive pulmonary disease with (acute) exacerbation: Secondary | ICD-10-CM | POA: Diagnosis not present

## 2016-09-25 DIAGNOSIS — I2510 Atherosclerotic heart disease of native coronary artery without angina pectoris: Secondary | ICD-10-CM | POA: Diagnosis not present

## 2016-09-25 DIAGNOSIS — I110 Hypertensive heart disease with heart failure: Secondary | ICD-10-CM | POA: Diagnosis not present

## 2016-09-26 DIAGNOSIS — J441 Chronic obstructive pulmonary disease with (acute) exacerbation: Secondary | ICD-10-CM | POA: Diagnosis not present

## 2016-09-26 DIAGNOSIS — I2510 Atherosclerotic heart disease of native coronary artery without angina pectoris: Secondary | ICD-10-CM | POA: Diagnosis not present

## 2016-09-26 DIAGNOSIS — I110 Hypertensive heart disease with heart failure: Secondary | ICD-10-CM | POA: Diagnosis not present

## 2016-09-26 DIAGNOSIS — I5042 Chronic combined systolic (congestive) and diastolic (congestive) heart failure: Secondary | ICD-10-CM | POA: Diagnosis not present

## 2016-09-26 DIAGNOSIS — M810 Age-related osteoporosis without current pathological fracture: Secondary | ICD-10-CM | POA: Diagnosis not present

## 2016-09-26 DIAGNOSIS — I70209 Unspecified atherosclerosis of native arteries of extremities, unspecified extremity: Secondary | ICD-10-CM | POA: Diagnosis not present

## 2016-09-27 ENCOUNTER — Telehealth: Payer: Self-pay

## 2016-09-27 DIAGNOSIS — I70209 Unspecified atherosclerosis of native arteries of extremities, unspecified extremity: Secondary | ICD-10-CM | POA: Diagnosis not present

## 2016-09-27 DIAGNOSIS — J441 Chronic obstructive pulmonary disease with (acute) exacerbation: Secondary | ICD-10-CM | POA: Diagnosis not present

## 2016-09-27 DIAGNOSIS — I110 Hypertensive heart disease with heart failure: Secondary | ICD-10-CM | POA: Diagnosis not present

## 2016-09-27 DIAGNOSIS — I5032 Chronic diastolic (congestive) heart failure: Secondary | ICD-10-CM

## 2016-09-27 DIAGNOSIS — I2510 Atherosclerotic heart disease of native coronary artery without angina pectoris: Secondary | ICD-10-CM | POA: Diagnosis not present

## 2016-09-27 DIAGNOSIS — M810 Age-related osteoporosis without current pathological fracture: Secondary | ICD-10-CM | POA: Diagnosis not present

## 2016-09-27 DIAGNOSIS — I5042 Chronic combined systolic (congestive) and diastolic (congestive) heart failure: Secondary | ICD-10-CM | POA: Diagnosis not present

## 2016-09-27 NOTE — Telephone Encounter (Signed)
Ordered. Thanks

## 2016-09-27 NOTE — Addendum Note (Signed)
Addended by: Ria Bush on: 09/27/2016 08:06 PM   Modules accepted: Orders

## 2016-09-27 NOTE — Telephone Encounter (Addendum)
Below is message from [place] from [facility]... Once order is done forward to me and I will reply to previous staff msg I have in my inbox  ----- Message from Darlina Guys sent at 09/27/2016  4:39 PM EDT ----- Regarding: RE: discontinue home [place]. We'll need an order to d/c O2 please.  You can have the MD use the DME O2 order in Epic and just write in the comments d/c O2.  No need to fill in all the other fields.  Since O2 is a drug, we have to have it in writing and signed by an MD. Just message me when the order is ready. Thank you! Melissa

## 2016-09-28 ENCOUNTER — Other Ambulatory Visit: Payer: Self-pay | Admitting: Family Medicine

## 2016-09-28 NOTE — Telephone Encounter (Signed)
msg sent to [facility] via staff msg

## 2016-09-29 ENCOUNTER — Telehealth: Payer: Self-pay

## 2016-09-29 NOTE — Telephone Encounter (Signed)
-----   Message from [facility] sent at 09/28/2016 10:48 AM EDT ----- Regarding: RE: discontinue home O2 Got it!  Thank you! ----- Message ----- From: Lurlean Nanny, CMA Sent: 09/28/2016   8:20 AM To: Melissa Stenson Subject: RE: discontinue home O2                        Good morning,  Dr Danise Mina let me know that he did put in the order as instructed. Please let me know if anything further is needed.  Thanks Melanie,CMA ----- Message ----- From: Darlina Guys Sent: 09/27/2016   4:39 PM To: Lurlean Nanny, CMA Subject: RE: discontinue home O2                        Hello Melanie. We'll need an order to d/c O2 please.  You can have the MD use the DME O2 order in Epic and just write in the comments d/c O2.  No need to fill in all the other fields.  Since O2 is a drug, we have to have it in writing and signed by an MD. Just message me when the order is ready. Thank you! Melissa  ----- Message ----- From: Lurlean Nanny, CMA Sent: 09/27/2016   1:44 PM To: Darlina Guys, Tera Partridge Devontenno, CMA Subject: discontinue home O2                            Good afternoon,  Patient was recently seen by Dr Danise Mina. He reports patient no longer needs home O2 and would like to discontinue. Please let me know if anything further is needed.  Melanie,CMA

## 2016-10-04 DIAGNOSIS — J441 Chronic obstructive pulmonary disease with (acute) exacerbation: Secondary | ICD-10-CM | POA: Diagnosis not present

## 2016-10-04 DIAGNOSIS — M810 Age-related osteoporosis without current pathological fracture: Secondary | ICD-10-CM | POA: Diagnosis not present

## 2016-10-04 DIAGNOSIS — I110 Hypertensive heart disease with heart failure: Secondary | ICD-10-CM | POA: Diagnosis not present

## 2016-10-04 DIAGNOSIS — I70209 Unspecified atherosclerosis of native arteries of extremities, unspecified extremity: Secondary | ICD-10-CM | POA: Diagnosis not present

## 2016-10-04 DIAGNOSIS — I5042 Chronic combined systolic (congestive) and diastolic (congestive) heart failure: Secondary | ICD-10-CM | POA: Diagnosis not present

## 2016-10-04 DIAGNOSIS — I2510 Atherosclerotic heart disease of native coronary artery without angina pectoris: Secondary | ICD-10-CM | POA: Diagnosis not present

## 2016-10-09 ENCOUNTER — Encounter: Payer: Self-pay | Admitting: Family Medicine

## 2016-10-09 ENCOUNTER — Telehealth: Payer: Self-pay | Admitting: Family Medicine

## 2016-10-09 ENCOUNTER — Ambulatory Visit (INDEPENDENT_AMBULATORY_CARE_PROVIDER_SITE_OTHER): Payer: Medicare Other | Admitting: Family Medicine

## 2016-10-09 VITALS — BP 134/78 | HR 85 | Temp 97.9°F | Ht 62.0 in | Wt 119.8 lb

## 2016-10-09 DIAGNOSIS — I5033 Acute on chronic diastolic (congestive) heart failure: Secondary | ICD-10-CM

## 2016-10-09 DIAGNOSIS — I2510 Atherosclerotic heart disease of native coronary artery without angina pectoris: Secondary | ICD-10-CM

## 2016-10-09 DIAGNOSIS — Z87891 Personal history of nicotine dependence: Secondary | ICD-10-CM | POA: Diagnosis not present

## 2016-10-09 MED ORDER — FUROSEMIDE 20 MG PO TABS: 10.0000 mg | ORAL_TABLET | Freq: Every day | ORAL | 3 refills | Status: DC | PRN

## 2016-10-09 NOTE — Patient Instructions (Signed)
I think you have a bit of extra fluid building up leading to weight gain, blood pressure creeping up, and leg swelling. Start lasix (furosemide) 10mg  (1/2 tablet daily). First 3 days, take 1/2 tablet daily, then may change to as needed for leg swelling. Let us know if not improving, or if not making good urine on new water pill.

## 2016-10-09 NOTE — Assessment & Plan Note (Signed)
Anticipate mild flare of dCHF. Treat with lasix 10mg  x3 days then PRN. Discussed lasix precautions and indications for treatment. Update if not improving with treatment. If ongoing need, would want to add potassium tablet PRN. Pt will update me with effect in 1-2 wks.

## 2016-10-09 NOTE — Telephone Encounter (Signed)
Pt has appt with Dr Darnell Level 10/09/16 at [place].

## 2016-10-09 NOTE — Assessment & Plan Note (Signed)
Remains abstinent. Congratulated and encouraged abstinence

## 2016-10-09 NOTE — Telephone Encounter (Signed)
[place] [facility]  Patient Name: Maria Simon  DOB: Jul 06, 1933    Initial Comment Caller states that her mother has bilateral foot swelling and has rapid weight gain    Nurse Assessment  Nurse: Harlow Mares, RN, Suanne Marker Date/Time (Eastern Time): 10/09/2016 10:20:58 AM  Confirm and document reason for call. If symptomatic, describe symptoms. ---Caller states that her mother has bilateral foot swelling and has rapid weight gain of 2 pounds in the last couple of days. Gets a couple of breathing txs daily. Doesn't wear O2. She was also discontinued the water pills. No noted SOB. Reports that she feels okay. Does complain of being very tired.  Does the patient have any new or worsening symptoms? ---Yes  Will a triage be completed? ---Yes  Related visit to physician within the last 2 weeks? ---No  Does the PT have any chronic conditions? (i.e. diabetes, asthma, etc.) ---Yes  List chronic conditions. ---COPD; HTN  Is this a behavioral health or substance abuse call? ---No     Guidelines    Guideline Title Affirmed Question Affirmed Notes  Leg Swelling and Edema [1] Thigh, calf, or ankle swelling AND [2] bilateral AND [3] 1 side is more swollen    Final Disposition User   See Physician within 4 Hours (or PCP triage) Harlow Mares, RN, Rhonda    Referrals  REFERRED TO PCP OFFICE   Disagree/Comply: Leta Baptist

## 2016-10-09 NOTE — Progress Notes (Signed)
BP 134/78 (BP Location: Right Arm, Cuff Size: Normal)   Pulse 85   Temp 97.9 F (36.6 C) (Oral)   Ht 5\' 2"  (1.575 m)   Wt 119 lb 12.8 oz (54.3 kg)   SpO2 95%   BMI 21.91 kg/m    CC: bilat foot swelling Subjective:    Patient ID: Maria Simon, female    DOB: 13-May-1933, 83 y.o.   MRN: [id number]  HPI: Maria Simon is a 83 y.o. female presenting on 10/09/2016 for Acute Visit (Pt here today, to discuss bilateral feet swelling on going for x 1 week but has gotten worse,Pt denies swelling any where else, doesn't hurt to walk )   Known COPD, known HFpEF. She had rhinovirus bronchitis with COPD exacerbation necessitating hospitalization several months ago. Imaging showed moderately severe centrilobular emphysema. She has been compliant off cigarettes on pulmicort neb BID, xopenex neb or alb inh PRN. Breathing is doing well - minimal cough or dyspnea. No longer using oxygen.   Here with 1 wk h/o bilateral foot and leg swelling L>R slightly. No pain with ambulation.  Denies dyspnea or cough.  Brings log of blood pressures, weights and oxygen - slowly progressing weight gain (113-->119lb), slowly trending BP up from 629B to 284X systolic, O2 sat dropping from 96% to 92%.   She tries to avoid salt in diet.   Relevant past medical, surgical, family and social history reviewed and updated as indicated. Interim medical history since our last visit reviewed. Allergies and medications reviewed and updated. Outpatient Medications Prior to Visit  Medication Sig Dispense Refill  . Albuterol Sulfate (PROAIR RESPICLICK) 324 (90 Base) MCG/ACT AEPB Inhale 2 puffs into the lungs every 6 (six) hours as needed. 1 each 3  . aspirin 81 MG tablet Take 81 mg by mouth daily.     Marland Kitchen atorvastatin (LIPITOR) 80 MG tablet TAKE 1 TABLET BY MOUTH EVERY DAY 90 tablet 3  . benzonatate (TESSALON) 100 MG capsule Take 1 capsule (100 mg total) by mouth 2 (two) times daily as needed for cough. 20 capsule 0  . bisacodyl  (DULCOLAX) 5 MG EC tablet Take 1 tablet (5 mg total) by mouth daily as needed for moderate constipation. 30 tablet 0  . budesonide (PULMICORT) 0.25 MG/2ML nebulizer solution USE 1 VIAL VIA NEBULIZER TWICE DAILY 60 mL 2  . Calcium Carb-Cholecalciferol (CALCIUM-VITAMIN D) 600-400 MG-UNIT TABS Take 1 tablet by mouth every other day.     . enalapril (VASOTEC) 10 MG tablet TAKE 1 TABLET BY MOUTH EVERY DAY 90 tablet 3  . levalbuterol (XOPENEX) 1.25 MG/0.5ML nebulizer solution Take 1.25 mg by nebulization every 6 (six) hours as needed for wheezing or shortness of breath. 1 each 1  . metoprolol tartrate (LOPRESSOR) 50 MG tablet TAKE 1 TABLET BY MOUTH TWICE DAILY 180 tablet 3  . Multiple Vitamin (MULTIVITAMIN) tablet Take 1 tablet by mouth daily. Centrum Silver     No facility-administered medications prior to visit.      Per HPI unless specifically indicated in ROS section below Review of Systems     Objective:    BP 134/78 (BP Location: Right Arm, Cuff Size: Normal)   Pulse 85   Temp 97.9 F (36.6 C) (Oral)   Ht 5\' 2"  (1.575 m)   Wt 119 lb 12.8 oz (54.3 kg)   SpO2 95%   BMI 21.91 kg/m   Wt Readings from Last 3 Encounters:  10/09/16 119 lb 12.8 oz (54.3 kg)  09/22/16 115 lb  12 oz (52.5 kg)  09/01/16 114 lb (51.7 kg)    Physical Exam  Constitutional: She appears well-developed and well-nourished. No distress.  HENT:  Mouth/Throat: Oropharynx is clear and moist. No oropharyngeal exudate.  Cardiovascular: Normal rate, regular rhythm, normal heart sounds and intact distal pulses.   No murmur heard. Pulmonary/Chest: Effort normal and breath sounds normal. No respiratory distress. She has no wheezes. She has no rales.  Crackles bibasilarly  Musculoskeletal: She exhibits edema (1+ pitting bilateral feet to mid calves).  Skin: Skin is warm. No rash noted.  Psychiatric: She has a normal mood and affect.  Nursing note and vitals reviewed.  Results for orders placed or performed in visit on  91/66/06  Basic metabolic panel  Result Value Ref Range   Sodium 141 135 - 145 mEq/L   Potassium 4.8 3.5 - 5.1 mEq/L   Chloride 103 96 - 112 mEq/L   CO2 31 19 - 32 mEq/L   Glucose, Bld 108 (H) 70 - 99 mg/dL   BUN 14 6 - 23 mg/dL   Creatinine, Ser 0.86 0.40 - 1.20 mg/dL   Calcium 10.4 8.4 - 10.5 mg/dL   GFR 67.32 >60.00 mL/min  TSH  Result Value Ref Range   TSH 1.82 0.35 - 4.50 uIU/mL  Vitamin B12  Result Value Ref Range   Vitamin B-12 1,084 (H) 211 - 911 pg/mL      Assessment & Plan:   Problem List Items Addressed This Visit    CHF (congestive heart failure) ([facility]) - Primary    Anticipate mild flare of dCHF. Treat with lasix 10mg  x3 days then PRN. Discussed lasix precautions and indications for treatment. Update if not improving with treatment. If ongoing need, would want to add potassium tablet PRN. Pt will update me with effect in 1-2 wks.       Relevant Medications   furosemide (LASIX) 20 MG tablet   Ex-smoker    Remains abstinent. Congratulated and encouraged abstinence          Follow up plan: Return if symptoms worsen or fail to improve.  Ria Bush, MD

## 2016-10-09 NOTE — Telephone Encounter (Signed)
will see

## 2016-10-10 DIAGNOSIS — J441 Chronic obstructive pulmonary disease with (acute) exacerbation: Secondary | ICD-10-CM | POA: Diagnosis not present

## 2016-10-10 DIAGNOSIS — I110 Hypertensive heart disease with heart failure: Secondary | ICD-10-CM | POA: Diagnosis not present

## 2016-10-10 DIAGNOSIS — I5042 Chronic combined systolic (congestive) and diastolic (congestive) heart failure: Secondary | ICD-10-CM | POA: Diagnosis not present

## 2016-10-10 DIAGNOSIS — I70209 Unspecified atherosclerosis of native arteries of extremities, unspecified extremity: Secondary | ICD-10-CM | POA: Diagnosis not present

## 2016-10-10 DIAGNOSIS — M810 Age-related osteoporosis without current pathological fracture: Secondary | ICD-10-CM | POA: Diagnosis not present

## 2016-10-10 DIAGNOSIS — I2510 Atherosclerotic heart disease of native coronary artery without angina pectoris: Secondary | ICD-10-CM | POA: Diagnosis not present

## 2016-10-20 DIAGNOSIS — I110 Hypertensive heart disease with heart failure: Secondary | ICD-10-CM | POA: Diagnosis not present

## 2016-10-20 DIAGNOSIS — J441 Chronic obstructive pulmonary disease with (acute) exacerbation: Secondary | ICD-10-CM | POA: Diagnosis not present

## 2016-10-20 DIAGNOSIS — I2510 Atherosclerotic heart disease of native coronary artery without angina pectoris: Secondary | ICD-10-CM | POA: Diagnosis not present

## 2016-10-20 DIAGNOSIS — M810 Age-related osteoporosis without current pathological fracture: Secondary | ICD-10-CM | POA: Diagnosis not present

## 2016-10-20 DIAGNOSIS — I5042 Chronic combined systolic (congestive) and diastolic (congestive) heart failure: Secondary | ICD-10-CM | POA: Diagnosis not present

## 2016-10-20 DIAGNOSIS — I70209 Unspecified atherosclerosis of native arteries of extremities, unspecified extremity: Secondary | ICD-10-CM | POA: Diagnosis not present

## 2016-10-23 ENCOUNTER — Telehealth: Payer: Self-pay

## 2016-10-23 NOTE — Telephone Encounter (Signed)
Pt was seen 10/09/16 and did take the lasix 1/2 tab daily for 3 days; then pt stopped and started to swell again and as been taking lasix 1/2 tab daily for few days. Today legs are not swollen and pt wanted to know if should cont. Taking lasix; advised per instructions on 10/09/16 to take lasix prn. Pt is not having and SOB and wt staying same around 116 lbs. Pt is also having her normal urine output. Pt will not take lasix today to see how she does; if begins to have swelling or SOB pt will cb if needed. FYI to Dr Darnell Level.

## 2016-11-13 ENCOUNTER — Other Ambulatory Visit: Payer: Self-pay | Admitting: Family Medicine

## 2016-11-27 ENCOUNTER — Telehealth: Payer: Self-pay

## 2016-11-27 NOTE — Telephone Encounter (Signed)
Agree 

## 2016-11-27 NOTE — Telephone Encounter (Signed)
Pt said [facility] sent her HCTZ and Dr Darnell Level stopped HCTZ on 09/01/16. Pt thinks HCTZ was on auto refill. Pt will contact [facility] to let them know HCTZ was stopped on 09/01/16 and see if pt can get refund. FYI to Dr Darnell Level.

## 2016-12-05 ENCOUNTER — Other Ambulatory Visit: Payer: Self-pay | Admitting: Family Medicine

## 2016-12-15 ENCOUNTER — Ambulatory Visit (INDEPENDENT_AMBULATORY_CARE_PROVIDER_SITE_OTHER): Payer: Medicare Other | Admitting: Family Medicine

## 2016-12-15 ENCOUNTER — Encounter: Payer: Self-pay | Admitting: Family Medicine

## 2016-12-15 VITALS — BP 118/66 | HR 86 | Temp 97.8°F | Wt 119.0 lb

## 2016-12-15 DIAGNOSIS — Z23 Encounter for immunization: Secondary | ICD-10-CM

## 2016-12-15 DIAGNOSIS — I2510 Atherosclerotic heart disease of native coronary artery without angina pectoris: Secondary | ICD-10-CM

## 2016-12-15 DIAGNOSIS — I5032 Chronic diastolic (congestive) heart failure: Secondary | ICD-10-CM

## 2016-12-15 DIAGNOSIS — N2889 Other specified disorders of kidney and ureter: Secondary | ICD-10-CM

## 2016-12-15 MED ORDER — FUROSEMIDE 20 MG PO TABS: 10.0000 mg | ORAL_TABLET | Freq: Every day | ORAL | 6 refills | Status: DC | PRN

## 2016-12-15 NOTE — Patient Instructions (Addendum)
Flu shot today Labs today. We will be in touch with lab results and lasix (furosemide) plan.  Good to see you today.  Return in 6 months for medicare wellness visit with Katha Cabal and follow up with me

## 2016-12-15 NOTE — Addendum Note (Signed)
Addended by: Brenton Grills on: 06/00/4599 03:17 PM   Modules accepted: Orders

## 2016-12-15 NOTE — Assessment & Plan Note (Signed)
Stable period. Check BMP then decide on lasix + potassium dosing. Pt agrees with plan.

## 2016-12-15 NOTE — Assessment & Plan Note (Addendum)
Stable. Will continue to monitor with symptoms only.

## 2016-12-15 NOTE — Progress Notes (Addendum)
BP 118/66 (BP Location: Left Arm, Patient Position: Sitting, Cuff Size: Normal)   Pulse 86   Temp 97.8 F (36.6 C) (Oral)   Wt 119 lb (54 kg)   SpO2 95%   BMI 21.77 kg/m    CC: 3 mo f/u visit Subjective:    Patient ID: Maria Simon, female    DOB: Jun 18, 1933, 83 y.o.   MRN: [id number]  HPI: Maria Simon is a 83 y.o. female presenting on 12/15/2016 for 3 mo follow-up   Known COPD, known HFpEF. H/o rhinovirus bronchitis with COPD exacerbation leading to hospitalization 08/2016. Seen here 10/2016 with mild CHF exacerbation, treated with lasix 10mg  x 3 days then PRN. She has continued 1/2 tab lasix daily, notes swelling if she stops lasix.   Brings log of weight and BP daily - stable 116 lbs over the past month.   Denies chest pain, dyspnea, coughin.   Known 1.8cm exophytic growth lower pole of L kidney incidentally found on CT> pt declined further evaluation. Denies L flank pain or urinary changes.   Daughter notes she is less likely to leave the house since hospitalization, but pt denies depression/anhedonia. Endorses improving energy levels in the past month.   Relevant past medical, surgical, family and social history reviewed and updated as indicated. Interim medical history since our last visit reviewed. Allergies and medications reviewed and updated. Outpatient Medications Prior to Visit  Medication Sig Dispense Refill  . aspirin 81 MG tablet Take 81 mg by mouth daily.     Marland Kitchen atorvastatin (LIPITOR) 80 MG tablet TAKE 1 TABLET BY MOUTH EVERY DAY 90 tablet 3  . bisacodyl (DULCOLAX) 5 MG EC tablet Take 1 tablet (5 mg total) by mouth daily as needed for moderate constipation. 30 tablet 0  . budesonide (PULMICORT) 0.25 MG/2ML nebulizer solution USE 1 VIAL VIA NEBULIZER TWICE DAILY 60 mL 5  . Calcium Carb-Cholecalciferol (CALCIUM-VITAMIN D) 600-400 MG-UNIT TABS Take 1 tablet by mouth every other day.     . enalapril (VASOTEC) 10 MG tablet TAKE 1 TABLET BY MOUTH EVERY DAY 90  tablet 3  . levalbuterol (XOPENEX) 1.25 MG/0.5ML nebulizer solution Take 1.25 mg by nebulization every 6 (six) hours as needed for wheezing or shortness of breath. 1 each 1  . metoprolol tartrate (LOPRESSOR) 50 MG tablet TAKE 1 TABLET BY MOUTH TWICE DAILY 180 tablet 3  . Multiple Vitamin (MULTIVITAMIN) tablet Take 1 tablet by mouth daily. Centrum Silver    . PROAIR RESPICLICK 664 (90 Base) MCG/ACT AEPB INHALE 2 PUFFS INTO THE LUNGS EVERY 6 HOURS AS NEEDED 1 each 3  . benzonatate (TESSALON) 100 MG capsule Take 1 capsule (100 mg total) by mouth 2 (two) times daily as needed for cough. 20 capsule 0  . furosemide (LASIX) 20 MG tablet Take 0.5 tablets (10 mg total) by mouth daily as needed for fluid or edema. 30 tablet 3   No facility-administered medications prior to visit.      Per HPI unless specifically indicated in ROS section below Review of Systems     Objective:    BP 118/66 (BP Location: Left Arm, Patient Position: Sitting, Cuff Size: Normal)   Pulse 86   Temp 97.8 F (36.6 C) (Oral)   Wt 119 lb (54 kg)   SpO2 95%   BMI 21.77 kg/m   Wt Readings from Last 3 Encounters:  12/15/16 119 lb (54 kg)  10/09/16 119 lb 12.8 oz (54.3 kg)  09/22/16 115 lb 12 oz (52.5 kg)  Physical Exam  Constitutional: She appears well-developed and well-nourished. No distress.  HENT:  Mouth/Throat: Oropharynx is clear and moist. No oropharyngeal exudate.  Cardiovascular: Normal rate, regular rhythm, normal heart sounds and intact distal pulses.   No murmur heard. Pulmonary/Chest: Effort normal and breath sounds normal. No respiratory distress. She has no wheezes. She has no rales.  Musculoskeletal: She exhibits no edema.  Skin: Skin is warm and dry. No rash noted.  Psychiatric: She has a normal mood and affect.  Nursing note and vitals reviewed.  Results for orders placed or performed in visit on 82/95/62  Basic metabolic panel  Result Value Ref Range   Sodium 141 135 - 145 mEq/L   Potassium  4.8 3.5 - 5.1 mEq/L   Chloride 103 96 - 112 mEq/L   CO2 31 19 - 32 mEq/L   Glucose, Bld 108 (H) 70 - 99 mg/dL   BUN 14 6 - 23 mg/dL   Creatinine, Ser 0.86 0.40 - 1.20 mg/dL   Calcium 10.4 8.4 - 10.5 mg/dL   GFR 67.32 >60.00 mL/min  TSH  Result Value Ref Range   TSH 1.82 0.35 - 4.50 uIU/mL  Vitamin B12  Result Value Ref Range   Vitamin B-12 1,084 (H) 211 - 911 pg/mL      Assessment & Plan:   Problem List Items Addressed This Visit    CHF (congestive heart failure) ([place]) - Primary    Stable period. Check BMP then decide on lasix + potassium dosing. Pt agrees with plan.      Relevant Medications   furosemide (LASIX) 20 MG tablet   Other Relevant Orders   Basic metabolic panel   Left kidney mass    Stable. Will continue to monitor with symptoms only.           Follow up plan: Return in about 6 months (around 06/15/2017) for medicare wellness visit, follow up visit.  Ria Bush, MD

## 2016-12-16 LAB — BASIC METABOLIC PANEL
BUN/Creatinine Ratio: 23 (calc) — ABNORMAL HIGH (ref 6–22)
BUN: 21 mg/dL (ref 7–25)
CO2: 32 mmol/L (ref 20–32)
Calcium: 9.7 mg/dL (ref 8.6–10.4)
Chloride: 102 mmol/L (ref 98–110)
Creat: 0.92 mg/dL — ABNORMAL HIGH (ref 0.60–0.88)
GLUCOSE: 161 mg/dL — AB (ref 65–99)
Potassium: 4.5 mmol/L (ref 3.5–5.3)
Sodium: 141 mmol/L (ref 135–146)

## 2017-01-01 ENCOUNTER — Other Ambulatory Visit: Payer: Self-pay | Admitting: Family Medicine

## 2017-01-04 ENCOUNTER — Other Ambulatory Visit: Payer: Self-pay

## 2017-01-29 ENCOUNTER — Ambulatory Visit: Payer: Self-pay | Admitting: *Deleted

## 2017-01-29 NOTE — Telephone Encounter (Signed)
  Reason for Disposition . Cough  Answer Assessment - Initial Assessment Questions 1. ONSET: "When did the cough begin?"      4 days 2. SEVERITY: "How bad is the cough today?"      Spells of coughing 3. RESPIRATORY DISTRESS: "Describe your breathing."     Normal  4. FEVER: "Do you have a fever?" If so, ask: "What is your temperature, how was it measured, and when did it start?"    no 5. SPUTUM: "Describe the color of your sputum" (clear, white, yellow, green)     A tint of color 6. HEMOPTYSIS: "Are you coughing up any blood?" If so ask: "How much?" (flecks, streaks, tablespoons, etc.)     no 7. CARDIAC HISTORY: "Do you have any history of heart disease?" (e.g., heart attack, congestive heart failure)     CHF 8. LUNG HISTORY: "Do you have any history of lung disease?"  (e.g., pulmonary embolus, asthma, emphysema)     COPD 9. PE RISK FACTORS: "Do you have a history of blood clots?" (or: recent major surgery, recent prolonged travel, bedridden )    no 10. OTHER SYMPTOMS: "Do you have any other symptoms?" (e.g., runny nose, wheezing, chest pain)     Runny nose 11. PREGNANCY: "Is there any chance you are pregnant?" "When was your last menstrual period?"       no 12. TRAVEL: "Have you traveled out of the country in the last month?" (e.g., travel history, exposures)       no  Protocols used: [place]

## 2017-02-02 ENCOUNTER — Telehealth: Payer: Self-pay | Admitting: Family Medicine

## 2017-02-02 MED ORDER — BENZONATATE 100 MG PO CAPS: 100.0000 mg | ORAL_CAPSULE | Freq: Three times a day (TID) | ORAL | 0 refills | Status: AC | PRN

## 2017-02-02 NOTE — Telephone Encounter (Signed)
refilled 

## 2017-02-02 NOTE — Telephone Encounter (Signed)
Patient needing a refill of benzonatate 100mg  capsules called into [facility] on [address] This is not a current med on med list that [place] can refill

## 2017-02-02 NOTE — Telephone Encounter (Signed)
Copied from [place] [id number]. Topic: Quick Communication - See Telephone Encounter >> Feb 02, 2017 12:54 PM Bea Graff, NT wrote: CRM for notification. See Telephone encounter for: Patient needing a refill of benzonatate 100mg  capsules called into [facility] on [address].   02/02/17.

## 2017-02-11 ENCOUNTER — Other Ambulatory Visit: Payer: Self-pay | Admitting: Family Medicine

## 2017-02-19 ENCOUNTER — Other Ambulatory Visit: Payer: Self-pay | Admitting: Family Medicine

## 2017-04-02 ENCOUNTER — Other Ambulatory Visit: Payer: Self-pay | Admitting: Family Medicine

## 2017-06-01 ENCOUNTER — Other Ambulatory Visit: Payer: Self-pay | Admitting: Family Medicine

## 2017-06-12 ENCOUNTER — Ambulatory Visit: Payer: Medicare Other

## 2017-06-14 ENCOUNTER — Other Ambulatory Visit: Payer: Self-pay | Admitting: Family Medicine

## 2017-06-14 DIAGNOSIS — D696 Thrombocytopenia, unspecified: Secondary | ICD-10-CM

## 2017-06-14 DIAGNOSIS — E7800 Pure hypercholesterolemia, unspecified: Secondary | ICD-10-CM

## 2017-06-14 DIAGNOSIS — M810 Age-related osteoporosis without current pathological fracture: Secondary | ICD-10-CM

## 2017-06-14 DIAGNOSIS — R7303 Prediabetes: Secondary | ICD-10-CM

## 2017-06-15 ENCOUNTER — Ambulatory Visit (INDEPENDENT_AMBULATORY_CARE_PROVIDER_SITE_OTHER): Payer: Medicare Other

## 2017-06-15 ENCOUNTER — Ambulatory Visit: Payer: Medicare Other

## 2017-06-15 VITALS — BP 110/70 | HR 87 | Temp 98.5°F | Ht 60.5 in | Wt 120.0 lb

## 2017-06-15 DIAGNOSIS — D696 Thrombocytopenia, unspecified: Secondary | ICD-10-CM

## 2017-06-15 DIAGNOSIS — E7800 Pure hypercholesterolemia, unspecified: Secondary | ICD-10-CM | POA: Diagnosis not present

## 2017-06-15 DIAGNOSIS — Z0000 Encounter for general adult medical examination without abnormal findings: Secondary | ICD-10-CM

## 2017-06-15 DIAGNOSIS — M810 Age-related osteoporosis without current pathological fracture: Secondary | ICD-10-CM

## 2017-06-15 DIAGNOSIS — R7303 Prediabetes: Secondary | ICD-10-CM | POA: Diagnosis not present

## 2017-06-15 LAB — COMPREHENSIVE METABOLIC PANEL
ALK PHOS: 90 U/L (ref 39–117)
ALT: 17 U/L (ref 0–35)
AST: 25 U/L (ref 0–37)
Albumin: 3.7 g/dL (ref 3.5–5.2)
BILIRUBIN TOTAL: 0.8 mg/dL (ref 0.2–1.2)
BUN: 18 mg/dL (ref 6–23)
CO2: 31 mEq/L (ref 19–32)
Calcium: 9.6 mg/dL (ref 8.4–10.5)
Chloride: 104 mEq/L (ref 96–112)
Creatinine, Ser: 0.85 mg/dL (ref 0.40–1.20)
GFR: 68.11 mL/min (ref >60.00)
GLUCOSE: 128 mg/dL — AB (ref 70–99)
Potassium: 4.7 mEq/L (ref 3.5–5.1)
SODIUM: 142 meq/L (ref 135–145)
TOTAL PROTEIN: 7.1 g/dL (ref 6.0–8.3)

## 2017-06-15 LAB — CBC WITH DIFFERENTIAL/PLATELET
Basophils Absolute: 0.3 10*3/uL — ABNORMAL HIGH (ref 0.0–0.1)
Basophils Relative: 5.2 % — ABNORMAL HIGH (ref 0.0–3.0)
EOS PCT: 1.2 % (ref 0.0–5.0)
Eosinophils Absolute: 0.1 10*3/uL (ref 0.0–0.7)
HEMATOCRIT: 38.5 % (ref 36.0–46.0)
Hemoglobin: 12.5 g/dL (ref 12.0–15.0)
Lymphocytes Relative: 11.3 % — ABNORMAL LOW (ref 12.0–46.0)
Lymphs Abs: 0.8 10*3/uL (ref 0.7–4.0)
MCHC: 32.4 g/dL (ref 30.0–36.0)
MCV: 76.3 fl — ABNORMAL LOW (ref 78.0–100.0)
MONO ABS: 0.5 10*3/uL (ref 0.1–1.0)
Monocytes Relative: 7.6 % (ref 3.0–12.0)
Neutro Abs: 5 10*3/uL (ref 1.4–7.7)
Neutrophils Relative %: 74.7 % (ref 43.0–77.0)
Platelets: 156 10*3/uL (ref 150.0–400.0)
RBC: 5.04 Mil/uL (ref 3.87–5.11)
RDW: 18.9 % — ABNORMAL HIGH (ref 11.5–15.5)
WBC: 6.7 10*3/uL (ref 4.0–10.5)

## 2017-06-15 LAB — LIPID PANEL
Cholesterol: 104 mg/dL (ref 0–200)
HDL: 33.9 mg/dL — ABNORMAL LOW (ref >39.00)
LDL Cholesterol: 57 mg/dL (ref 0–99)
NONHDL: 69.71
Total CHOL/HDL Ratio: 3
Triglycerides: 63 mg/dL (ref 0.0–149.0)
VLDL: 12.6 mg/dL (ref 0.0–40.0)

## 2017-06-15 LAB — VITAMIN D 25 HYDROXY (VIT D DEFICIENCY, FRACTURES): VITD: 45.64 ng/mL (ref 30.00–100.00)

## 2017-06-15 LAB — HEMOGLOBIN A1C: HEMOGLOBIN A1C: 6.1 % (ref 4.6–6.5)

## 2017-06-15 NOTE — Progress Notes (Signed)
Subjective:   Maria Simon is a 84 y.o. female who presents for Medicare Annual (Subsequent) preventive examination.  Review of Systems:  N/A Cardiac Risk Factors include: advanced age (>76men, >19 women);hypertension;dyslipidemia     Objective:     Vitals: BP 110/70 (BP Location: Right Arm, Patient Position: Sitting, Cuff Size: Normal)   Pulse 87   Temp 98.5 F (36.9 C) (Oral)   Ht 5' 0.5" (1.537 m) Comment: shoes  Wt 120 lb (54.4 kg)   SpO2 97%   BMI 23.05 kg/m   Body mass index is 23.05 kg/m.  Advanced Directives 06/15/2017 08/22/2016 08/22/2016 06/09/2016 04/30/2015  Does Patient Have a Medical Advance Directive? No Yes No No Yes  Type of Advance Directive - [place];Living will - - Living will  Does patient want to make changes to medical advance directive? - No - Patient declined - - No - Patient declined  Copy of [place] in Chart? - Yes - - No - copy requested  Would patient like information on creating a medical advance directive? Yes (MAU/Ambulatory/Procedural Areas - Information given) - - - -    Tobacco Social History   Tobacco Use  Smoking Status Former Smoker  . Packs/day: 0.50  . Years: 61.00  . Pack years: 30.50  . Types: Cigarettes  . Last attempt to quit: 08/04/2016  . Years since quitting: 0.8  Smokeless Tobacco Never Used  Tobacco Comment   1/2 PPD usually, but sometimes more     Counseling given: Not Answered Comment: 1/2 PPD usually, but sometimes more   Clinical Intake:  Pre-visit preparation completed: Yes  Pain : No/denies pain Pain Score: 0-No pain     Nutritional Status: BMI of 19-24  Normal Nutritional Risks: None Diabetes: No  How often do you need to have someone help you when you read instructions, pamphlets, or other written materials from your doctor or pharmacy?: 1 - Never What is the last grade level you completed in school?: 9th grade  Interpreter Needed?: No  Comments: pt  lives with spouse Information entered by :: LPinson, LPN  Past Medical History:  Diagnosis Date  . CAD (coronary artery disease)    class 1-2 angina  . History of chicken pox   . History of TIAs 1998  . HLD (hyperlipidemia)   . HTN (hypertension)   . Osteoporosis    h/o compression fx T11, L5 by CT scan, Dexa 11/2010 -3.9 spine, -3.5 femur  . PAD (peripheral artery disease) ([place])   . Prediabetes 05/26/2011  . Smoker    ? COPD  . Status post thoracic aortic aneurysm repair 2006   graft in place, refused CT f/u in past.  . Systolic and diastolic CHF, chronic ([place])    last ultrasound prior to 2011, EF 10-15%, dilated likely nonischemic CM with CHF, will need to [place] with cards   Past Surgical History:  Procedure Laterality Date  . AORTA - SUPERIOR MESENTERIC ARTERY BYPASS GRAFT    . APPENDECTOMY  1950  . CT SCAN  07/2005   patent TAA graft, small endoleak, enlarging aneurysm.  patent SMA/celiac artery graft.  stable dissection of L iliac arteries, emphysema, , osteopenia, compression fx T11, L5, refused further CT scans  . DEXA  11/2010   -3.9 spine, -3.5 femur  . THORACIC AORTIC ANEURYSM REPAIR  01/11/2005   first open then endovascular graft; GORE TAG ref: [id number]; Lot: [id number] [facility])  . TOTAL ABDOMINAL HYSTERECTOMY  1973  ovaries out.  reason: bleeding  . US ECHOCARDIOGRAPHY  11/3233   systolic and diastolic dysfunction, EF 57-32%, dilated L vent, CAD, degenerative mitral valve with regurg, aortic sclerosis, R vent dysfunction, pulm HTN  . VEIN LIGATION AND STRIPPING     Family History  Problem Relation Age of Onset  . Mental illness Mother        deceased suicide  . Cancer Neg Hx   . Diabetes Neg Hx   . Stroke Neg Hx   . Hypertension Neg Hx    Social History   Socioeconomic History  . Marital status: Widowed    Spouse name: Not on file  . Number of children: 8  . Years of education: 10th grade  . Highest education level: Not on file  Occupational  History  . 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 Financial resource strain: Not on file  . Food insecurity:    Worry: Not on file    Inability: Not on file  . Transportation needs:    Medical: Not on file    Non-medical: Not on file  Tobacco Use  . Smoking status: Former Smoker    Packs/day: 0.50    Years: 61.00    Pack years: 30.50    Types: Cigarettes    Last attempt to quit: 08/04/2016    Years since quitting: 0.8  . Smokeless tobacco: Never Used  . Tobacco comment: 1/2 PPD usually, but sometimes more  Substance and Sexual Activity  . Alcohol use: No  . Drug use: No  . Sexual activity: Not Currently  Lifestyle  . Physical activity:    Days per week: Not on file    Minutes per session: Not on file  . Stress: Not on file  Relationships  . Social connections:    Talks on phone: Not on file    Gets together: Not on file    Attends religious service: Not on file    Active member of club or organization: Not on file    Attends meetings of clubs or organizations: Not on file    Relationship status: Not on file  Other Topics Concern  . Not on file  Social History Narrative   Caffeine: 2-3 coffee cups/day   Lives alone, no pets.   Daughter ([name]) nearby, granddaughter nearby   Edu: 10th grade   Activity: yardwork   Diet: some water, fruits/vegetables daily    Outpatient Encounter Medications as of 06/15/2017  Medication Sig  . aspirin 81 MG tablet Take 81 mg by mouth daily.   Marland Kitchen atorvastatin (LIPITOR) 80 MG tablet TAKE 1 TABLET BY MOUTH EVERY DAY  . benzonatate (TESSALON) 100 MG capsule Take 1 capsule (100 mg total) by mouth 3 (three) times daily as needed for cough.  . bisacodyl (DULCOLAX) 5 MG EC tablet Take 1 tablet (5 mg total) by mouth daily as needed for moderate constipation.  . budesonide (PULMICORT) 0.25 MG/2ML nebulizer solution USE 1 VIAL VIA NEBULIZER TWICE DAILY  . Calcium Carb-Cholecalciferol  (CALCIUM-VITAMIN D) 600-400 MG-UNIT TABS Take 1 tablet by mouth every other day.   . enalapril (VASOTEC) 10 MG tablet TAKE 1 TABLET BY MOUTH EVERY DAY  . furosemide (LASIX) 20 MG tablet Take 0.5 tablets (10 mg total) by mouth daily as needed for fluid or edema.  . levalbuterol (XOPENEX) 1.25 MG/0.5ML nebulizer solution Take 1.25 mg by nebulization every 6 (six) hours as needed  for wheezing or shortness of breath.  . metoprolol tartrate (LOPRESSOR) 50 MG tablet TAKE 1 TABLET BY MOUTH TWICE DAILY  . Multiple Vitamin (MULTIVITAMIN) tablet Take 1 tablet by mouth daily. Centrum Silver  . PROAIR RESPICLICK 476 (90 Base) MCG/ACT AEPB INHALE 2 PUFFS INTO THE LUNGS EVERY 6 HOURS AS NEEDED   No facility-administered encounter medications on file as of 06/15/2017.     Activities of Daily Living In your present state of health, do you have any difficulty performing the following activities: 06/15/2017 08/22/2016  Hearing? N Y  Vision? N N  Difficulty concentrating or making decisions? N N  Walking or climbing stairs? Y N  Dressing or bathing? N N  Doing errands, shopping? [place]  Preparing Food and eating ? N -  Using the Toilet? N -  In the past six months, have you accidently leaked urine? N -  Do you have problems with loss of bowel control? N -  Managing your Medications? N -  Managing your Finances? N -  Housekeeping or managing your Housekeeping? N -  Some recent data might be hidden    Patient Care Team: Ria Bush, MD as PCP - General (Family Medicine)    Assessment:   This is a routine wellness examination for [place].   Hearing Screening   125Hz  250Hz  500Hz  1000Hz  2000Hz  3000Hz  4000Hz  6000Hz  8000Hz   Right ear:   0 0 40  0    Left ear:   40 0 40  40      Visual Acuity Screening   Right eye Left eye Both eyes  Without correction:     With correction: 20/200 20/70 20/50     Exercise Activities and Dietary recommendations Current Exercise Habits: The patient does not  participate in regular exercise at present, Exercise limited by: None identified  Goals    . DIET - INCREASE WATER INTAKE     Starting 06/15/2017, I will attempt to drink at least 2 more glasses of water daily.       Fall Risk Fall Risk  06/15/2017 06/09/2016 04/30/2015 01/23/2014 05/31/2012  Falls in the past year? No No No No No   Depression Screen PHQ 2/9 Scores 06/15/2017 06/09/2016 04/30/2015 01/23/2014  PHQ - 2 Score 0 0 0 0  PHQ- 9 Score 0 - - -     Cognitive Function MMSE - Mini Mental State Exam 06/15/2017 06/09/2016  Orientation to time 5 5  Orientation to Place 5 5  Registration 3 3  Attention/ Calculation 0 0  Recall 3 3  Language- name 2 objects 0 0  Language- repeat 1 1  Language- follow 3 step command 3 3  Language- read & follow direction 0 0  Write a sentence 0 0  Copy design 0 0  Total score 20 20     PLEASE NOTE: A Mini-Cog screen was completed. Maximum score is 20. A value of 0 denotes this part of [place] MMSE was not completed or the patient failed this part of the Mini-Cog screening.   Mini-Cog Screening Orientation to Time - Max 5 pts Orientation to Place - Max 5 pts Registration - Max 3 pts Recall - Max 3 pts Language Repeat - Max 1 pts Language Follow 3 Step Command - Max 3 pts     Immunization History  Administered Date(s) Administered  . Influenza Split 11/25/2010, 12/01/2011  . Influenza,inj,Quad PF,6+ Mos 12/13/2012, 12/12/2013, 11/27/2014, 11/26/2015, 12/15/2016  . Influenza-Unspecified 12/04/2016  . Pneumococcal Conjugate-13 12/12/2013  .  Pneumococcal Polysaccharide-23 02/11/2006   Screening Tests Health Maintenance  Topic Date Due  . DTaP/Tdap/Td (1 - Tdap) 04/09/2025 (Originally 10/05/1952)  . TETANUS/TDAP  04/09/2025 (Originally 10/05/1952)  . INFLUENZA VACCINE  10/04/2017  . DEXA SCAN  Completed  . PNA vac Low Risk Adult  Completed       Plan:     I have personally reviewed, addressed, and noted the following in the patient's  chart:  A. Medical and social history B. Use of alcohol, tobacco or illicit drugs  C. Current medications and supplements D. Functional ability and status E.  Nutritional status F.  Physical activity G. Advance directives H. List of other physicians I.  Hospitalizations, surgeries, and ER visits in previous 12 months J.  [place] to include hearing, vision, cognitive, depression L. Referrals and appointments - none  In addition, I have reviewed and discussed with patient certain preventive protocols, quality metrics, and best practice recommendations. A written personalized care plan for preventive services as well as general preventive health recommendations were provided to patient.  See attached scanned questionnaire for additional information.   Signed,   Lindell Noe, MHA, BS, LPN Health Coach

## 2017-06-15 NOTE — Progress Notes (Signed)
I reviewed health advisor's note, was available for consultation, and agree with documentation and plan.  

## 2017-06-15 NOTE — Progress Notes (Signed)
PCP notes:   Health maintenance:  No gaps identified.   Abnormal screenings:    Hearing Screening   125Hz  250Hz  500Hz  1000Hz  2000Hz  3000Hz  4000Hz  6000Hz  8000Hz   Right ear:   0 0 40  0    Left ear:   40 0 40  40     Patient concerns:   None  Nurse concerns:  None  Next PCP appt:   06/20/17 @ 1730

## 2017-06-15 NOTE — Patient Instructions (Signed)
Maria Simon , Thank you for taking time to come for your Medicare Wellness Visit. I appreciate your ongoing commitment to your health goals. Please review the following plan we discussed and let me know if I can assist you in the future.   These are the goals we discussed: Goals    . DIET - INCREASE WATER INTAKE     Starting 06/15/2017, I will attempt to drink at least 2 more glasses of water daily.       This is a list of the screening recommended for you and due dates:  Health Maintenance  Topic Date Due  . DTaP/Tdap/Td vaccine (1 - Tdap) 04/09/2025*  . Tetanus Vaccine  04/09/2025*  . Flu Shot  10/04/2017  . DEXA scan (bone density measurement)  Completed  . Pneumonia vaccines  Completed  *Topic was postponed. The date shown is not the original due date.   Preventive Care for Adults  A healthy lifestyle and preventive care can promote health and wellness. Preventive health guidelines for adults include the following key practices.  . A routine yearly physical is a good way to check with your health care provider about your health and preventive screening. It is a chance to share any concerns and updates on your health and to receive a thorough exam.  . Visit your dentist for a routine exam and preventive care every 6 months. Brush your teeth twice a day and floss once a day. Good oral hygiene prevents tooth decay and gum disease.  . The frequency of eye exams is based on your age, health, family medical history, use  of contact lenses, and other factors. Follow your health care provider's recommendations for frequency of eye exams.  . Eat a healthy diet. Foods like vegetables, fruits, whole grains, low-fat dairy products, and lean protein foods contain the nutrients you need without too many calories. Decrease your intake of foods high in solid fats, added sugars, and salt. Eat the right amount of calories for you. Get information about a proper diet from your health care provider,  if necessary.  . Regular physical exercise is one of the most important things you can do for your health. Most adults should get at least 150 minutes of moderate-intensity exercise (any activity that increases your heart rate and causes you to sweat) each week. In addition, most adults need muscle-strengthening exercises on 2 or more days a week.  Silver Sneakers may be a benefit available to you. To determine eligibility, you may visit the website: www.silversneakers.com or contact program at 250-527-6309 Mon-Fri between 8AM-8PM.   . Maintain a healthy weight. The body mass index (BMI) is a screening tool to identify possible weight problems. It provides an estimate of body fat based on height and weight. Your health care provider can find your BMI and can help you achieve or maintain a healthy weight.   For adults 20 years and older: ? A BMI below 18.5 is considered underweight. ? A BMI of 18.5 to 24.9 is normal. ? A BMI of 25 to 29.9 is considered overweight. ? A BMI of 30 and above is considered obese.   . Maintain normal blood lipids and cholesterol levels by exercising and minimizing your intake of saturated fat. Eat a balanced diet with plenty of fruit and vegetables. Blood tests for lipids and cholesterol should begin at age 32 and be repeated every 5 years. If your lipid or cholesterol levels are high, you are over 50, or you are at high  risk for heart disease, you may need your cholesterol levels checked more frequently. Ongoing high lipid and cholesterol levels should be treated with medicines if diet and exercise are not working.  . If you smoke, find out from your health care provider how to quit. If you do not use tobacco, please do not start.  . If you choose to drink alcohol, please do not consume more than 2 drinks per day. One drink is considered to be 12 ounces (355 mL) of beer, 5 ounces (148 mL) of wine, or 1.5 ounces (44 mL) of liquor.  . If you are 56-47 years old, ask  your health care provider if you should take aspirin to prevent strokes.  . Use sunscreen. Apply sunscreen liberally and repeatedly throughout the day. You should seek shade when your shadow is shorter than you. Protect yourself by wearing long sleeves, pants, a wide-brimmed hat, and sunglasses year round, whenever you are outdoors.  . Once a month, do a whole body skin exam, using a mirror to look at the skin on your back. Tell your health care provider of new moles, moles that have irregular borders, moles that are larger than a pencil eraser, or moles that have changed in shape or color.

## 2017-06-19 ENCOUNTER — Ambulatory Visit: Payer: Medicare Other | Admitting: Family Medicine

## 2017-06-20 ENCOUNTER — Ambulatory Visit (INDEPENDENT_AMBULATORY_CARE_PROVIDER_SITE_OTHER): Payer: Medicare Other | Admitting: Family Medicine

## 2017-06-20 ENCOUNTER — Encounter: Payer: Self-pay | Admitting: Family Medicine

## 2017-06-20 VITALS — BP 122/62 | HR 88 | Temp 98.2°F | Ht 61.0 in | Wt 121.0 lb

## 2017-06-20 DIAGNOSIS — I10 Essential (primary) hypertension: Secondary | ICD-10-CM | POA: Diagnosis not present

## 2017-06-20 DIAGNOSIS — N2889 Other specified disorders of kidney and ureter: Secondary | ICD-10-CM

## 2017-06-20 DIAGNOSIS — I5032 Chronic diastolic (congestive) heart failure: Secondary | ICD-10-CM | POA: Diagnosis not present

## 2017-06-20 DIAGNOSIS — Z87891 Personal history of nicotine dependence: Secondary | ICD-10-CM | POA: Diagnosis not present

## 2017-06-20 DIAGNOSIS — Z7189 Other specified counseling: Secondary | ICD-10-CM | POA: Diagnosis not present

## 2017-06-20 DIAGNOSIS — M810 Age-related osteoporosis without current pathological fracture: Secondary | ICD-10-CM

## 2017-06-20 DIAGNOSIS — I2510 Atherosclerotic heart disease of native coronary artery without angina pectoris: Secondary | ICD-10-CM

## 2017-06-20 DIAGNOSIS — J432 Centrilobular emphysema: Secondary | ICD-10-CM

## 2017-06-20 DIAGNOSIS — R7303 Prediabetes: Secondary | ICD-10-CM | POA: Diagnosis not present

## 2017-06-20 DIAGNOSIS — E7800 Pure hypercholesterolemia, unspecified: Secondary | ICD-10-CM

## 2017-06-20 DIAGNOSIS — Z9889 Other specified postprocedural states: Secondary | ICD-10-CM | POA: Diagnosis not present

## 2017-06-20 DIAGNOSIS — Z604 Social exclusion and rejection: Secondary | ICD-10-CM

## 2017-06-20 DIAGNOSIS — Z8679 Personal history of other diseases of the circulatory system: Secondary | ICD-10-CM

## 2017-06-20 DIAGNOSIS — Z66 Do not resuscitate: Secondary | ICD-10-CM

## 2017-06-20 DIAGNOSIS — Z8673 Personal history of transient ischemic attack (TIA), and cerebral infarction without residual deficits: Secondary | ICD-10-CM | POA: Diagnosis not present

## 2017-06-20 MED ORDER — FUROSEMIDE 20 MG PO TABS: 10.0000 mg | ORAL_TABLET | Freq: Every day | ORAL | 6 refills | Status: DC | PRN

## 2017-06-20 NOTE — Assessment & Plan Note (Signed)
Pt declined further evaluation of this. Consider updated UA next visit.

## 2017-06-20 NOTE — Assessment & Plan Note (Signed)
On aspirin, statin.  

## 2017-06-20 NOTE — Assessment & Plan Note (Signed)
Chronic, stable. Continue current regimen. 

## 2017-06-20 NOTE — Assessment & Plan Note (Signed)
Encouraged avoiding added sugars in diet.  

## 2017-06-20 NOTE — Assessment & Plan Note (Signed)
Continue aspirin, statin.  

## 2017-06-20 NOTE — Patient Instructions (Addendum)
Take calcium/vit D pill daily. Goal calcium is 1273m per day.  Work on wLockheed Martinbearing exercise regularly to keep bones strong Increase water in the diet. Return in 6 months for follow up visit. Health Maintenance, Female Adopting a healthy lifestyle and getting preventive care can go a long way to promote health and wellness. Talk with your health care provider about what schedule of regular examinations is right for you. This is a good chance for you to check in with your provider about disease prevention and staying healthy. In between checkups, there are plenty of things you can do on your own. Experts have done a lot of research about which lifestyle changes and preventive measures are most likely to keep you healthy. Ask your health care provider for more information. Weight and diet Eat a healthy diet  Be sure to include plenty of vegetables, fruits, low-fat dairy products, and lean protein.  Do not eat a lot of foods high in solid fats, added sugars, or salt.  Get regular exercise. This is one of the most important things you can do for your health. ? Most adults should exercise for at least 150 minutes each week. The exercise should increase your heart rate and make you sweat (moderate-intensity exercise). ? Most adults should also do strengthening exercises at least twice a week. This is in addition to the moderate-intensity exercise.  Maintain a healthy weight  Body mass index (BMI) is a measurement that can be used to identify possible weight problems. It estimates body fat based on height and weight. Your health care provider can help determine your BMI and help you achieve or maintain a healthy weight.  For females 243years of age and older: ? A BMI below 18.5 is considered underweight. ? A BMI of 18.5 to 24.9 is normal. ? A BMI of 25 to 29.9 is considered overweight. ? A BMI of 30 and above is considered obese.  Watch levels of cholesterol and blood lipids  You should  start having your blood tested for lipids and cholesterol at 82years of age, then have this test every 5 years.  You may need to have your cholesterol levels checked more often if: ? Your lipid or cholesterol levels are high. ? You are older than 82years of age. ? You are at high risk for heart disease.  Cancer screening Lung Cancer  Lung cancer screening is recommended for adults 55388years old who are at high risk for lung cancer because of a history of smoking.  A yearly low-dose CT scan of the lungs is recommended for people who: ? Currently smoke. ? Have quit within the past 15 years. ? Have at least a 30-pack-year history of smoking. A pack year is smoking an average of one pack of cigarettes a day for 1 year.  Yearly screening should continue until it has been 15 years since you quit.  Yearly screening should stop if you develop a health problem that would prevent you from having lung cancer treatment.  Breast Cancer  Practice breast self-awareness. This means understanding how your breasts normally appear and feel.  It also means doing regular breast self-exams. Let your health care provider know about any changes, no matter how small.  If you are in your 20s or 30s, you should have a clinical breast exam (CBE) by a health care provider every 1-3 years as part of a regular health exam.  If you are 431or older, have a CBE every year.  Also consider having a breast X-ray (mammogram) every year.  If you have a family history of breast cancer, talk to your health care provider about genetic screening.  If you are at high risk for breast cancer, talk to your health care provider about having an MRI and a mammogram every year.  Breast cancer gene (BRCA) assessment is recommended for women who have family members with BRCA-related cancers. BRCA-related cancers include: ? Breast. ? Ovarian. ? Tubal. ? Peritoneal cancers.  Results of the assessment will determine the need  for genetic counseling and BRCA1 and BRCA2 testing.  Cervical Cancer Your health care provider may recommend that you be screened regularly for cancer of the pelvic organs (ovaries, uterus, and vagina). This screening involves a pelvic examination, including checking for microscopic changes to the surface of your cervix (Pap test). You may be encouraged to have this screening done every 3 years, beginning at age 50.  For women ages 27-65, health care providers may recommend pelvic exams and Pap testing every 3 years, or they may recommend the Pap and pelvic exam, combined with testing for human papilloma virus (HPV), every 5 years. Some types of HPV increase your risk of cervical cancer. Testing for HPV may also be done on women of any age with unclear Pap test results.  Other health care providers may not recommend any screening for nonpregnant women who are considered low risk for pelvic cancer and who do not have symptoms. Ask your health care provider if a screening pelvic exam is right for you.  If you have had past treatment for cervical cancer or a condition that could lead to cancer, you need Pap tests and screening for cancer for at least 20 years after your treatment. If Pap tests have been discontinued, your risk factors (such as having a new sexual partner) need to be reassessed to determine if screening should resume. Some women have medical problems that increase the chance of getting cervical cancer. In these cases, your health care provider may recommend more frequent screening and Pap tests.  Colorectal Cancer  This type of cancer can be detected and often prevented.  Routine colorectal cancer screening usually begins at 82 years of age and continues through 82 years of age.  Your health care provider may recommend screening at an earlier age if you have risk factors for colon cancer.  Your health care provider may also recommend using home test kits to check for hidden blood in  the stool.  A small camera at the end of a tube can be used to examine your colon directly (sigmoidoscopy or colonoscopy). This is done to check for the earliest forms of colorectal cancer.  Routine screening usually begins at age 40.  Direct examination of the colon should be repeated every 5-10 years through 82 years of age. However, you may need to be screened more often if early forms of precancerous polyps or small growths are found.  Skin Cancer  Check your skin from head to toe regularly.  Tell your health care provider about any new moles or changes in moles, especially if there is a change in a mole's shape or color.  Also tell your health care provider if you have a mole that is larger than the size of a pencil eraser.  Always use sunscreen. Apply sunscreen liberally and repeatedly throughout the day.  Protect yourself by wearing long sleeves, pants, a wide-brimmed hat, and sunglasses whenever you are outside.  Heart disease, diabetes, and high  blood pressure  High blood pressure causes heart disease and increases the risk of stroke. High blood pressure is more likely to develop in: ? People who have blood pressure in the high end of the normal range (130-139/85-89 mm Hg). ? People who are overweight or obese. ? People who are African American.  If you are 3-100 years of age, have your blood pressure checked every 3-5 years. If you are 49 years of age or older, have your blood pressure checked every year. You should have your blood pressure measured twice-once when you are at a hospital or clinic, and once when you are not at a hospital or clinic. Record the average of the two measurements. To check your blood pressure when you are not at a hospital or clinic, you can use: ? An automated blood pressure machine at a pharmacy. ? A home blood pressure monitor.  If you are between 62 years and 2 years old, ask your health care provider if you should take aspirin to prevent  strokes.  Have regular diabetes screenings. This involves taking a blood sample to check your fasting blood sugar level. ? If you are at a normal weight and have a low risk for diabetes, have this test once every three years after 82 years of age. ? If you are overweight and have a high risk for diabetes, consider being tested at a younger age or more often. Preventing infection Hepatitis B  If you have a higher risk for hepatitis B, you should be screened for this virus. You are considered at high risk for hepatitis B if: ? You were born in a country where hepatitis B is common. Ask your health care provider which countries are considered high risk. ? Your parents were born in a high-risk country, and you have not been immunized against hepatitis B (hepatitis B vaccine). ? You have HIV or AIDS. ? You use needles to inject street drugs. ? You live with someone who has hepatitis B. ? You have had sex with someone who has hepatitis B. ? You get hemodialysis treatment. ? You take certain medicines for conditions, including cancer, organ transplantation, and autoimmune conditions.  Hepatitis C  Blood testing is recommended for: ? Everyone born from [birth year] through 1963. ? Anyone with known risk factors for hepatitis C.  Sexually transmitted infections (STIs)  You should be screened for sexually transmitted infections (STIs) including gonorrhea and chlamydia if: ? You are sexually active and are younger than 82 years of age. ? You are older than 82 years of age and your health care provider tells you that you are at risk for this type of infection. ? Your sexual activity has changed since you were last screened and you are at an increased risk for chlamydia or gonorrhea. Ask your health care provider if you are at risk.  If you do not have HIV, but are at risk, it may be recommended that you take a prescription medicine daily to prevent HIV infection. This is called pre-exposure prophylaxis  (PrEP). You are considered at risk if: ? You are sexually active and do not regularly use condoms or know the HIV status of your partner(s). ? You take drugs by injection. ? You are sexually active with a partner who has HIV.  Talk with your health care provider about whether you are at high risk of being infected with HIV. If you choose to begin PrEP, you should first be tested for HIV. You should then be tested  every 3 months for as long as you are taking PrEP. Pregnancy  If you are premenopausal and you may become pregnant, ask your health care provider about preconception counseling.  If you may become pregnant, take 400 to 800 micrograms (mcg) of folic acid every day.  If you want to prevent pregnancy, talk to your health care provider about birth control (contraception). Osteoporosis and menopause  Osteoporosis is a disease in which the bones lose minerals and strength with aging. This can result in serious bone fractures. Your risk for osteoporosis can be identified using a bone density scan.  If you are 66 years of age or older, or if you are at risk for osteoporosis and fractures, ask your health care provider if you should be screened.  Ask your health care provider whether you should take a calcium or vitamin D supplement to lower your risk for osteoporosis.  Menopause may have certain physical symptoms and risks.  Hormone replacement therapy may reduce some of these symptoms and risks. Talk to your health care provider about whether hormone replacement therapy is right for you. Follow these instructions at home:  Schedule regular health, dental, and eye exams.  Stay current with your immunizations.  Do not use any tobacco products including cigarettes, chewing tobacco, or electronic cigarettes.  If you are pregnant, do not drink alcohol.  If you are breastfeeding, limit how much and how often you drink alcohol.  Limit alcohol intake to no more than 1 drink per day for  nonpregnant women. One drink equals 12 ounces of beer, 5 ounces of wine, or 1 ounces of hard liquor.  Do not use street drugs.  Do not share needles.  Ask your health care provider for help if you need support or information about quitting drugs.  Tell your health care provider if you often feel depressed.  Tell your health care provider if you have ever been abused or do not feel safe at home. This information is not intended to replace advice given to you by your health care provider. Make sure you discuss any questions you have with your health care provider. Document Released: 09/05/2010 Document Revised: 07/29/2015 Document Reviewed: 11/24/2014 Elsevier Interactive Patient Education  [employer].

## 2017-06-20 NOTE — Assessment & Plan Note (Signed)
Chronic, stable. Seems euvolemic. Continue lasix 10mg  daily.

## 2017-06-20 NOTE — Assessment & Plan Note (Signed)
Reviewed with patient benefits of social engagement including preserving mental acuity.

## 2017-06-20 NOTE — Assessment & Plan Note (Signed)
Stable on repeat CTA 08/2016

## 2017-06-20 NOTE — Assessment & Plan Note (Signed)
Took 11 months bisphosphonate, stopped due to groin pain. Continues calcium and vitamin D. Not interested in further eval/treatment. Encouraged regular weight bearing exercise.

## 2017-06-20 NOTE — Progress Notes (Signed)
BP 122/62 (BP Location: Left Arm, Patient Position: Sitting, Cuff Size: Normal)   Pulse 88   Temp 98.2 F (36.8 C) (Oral)   Ht 5\' 1"  (1.549 m)   Wt 121 lb (54.9 kg)   SpO2 93%   BMI 22.86 kg/m    CC: AMW f/u visit Subjective:    Patient ID: Maria Simon, female    DOB: November 28, 1933, 84 y.o.   MRN: [id number]  HPI: Maria Simon is a 84 y.o. female presenting on 06/20/2017 for Annual Exam (Pt 2.)   Saw Katha Cabal last week for medicare wellness visit. Note reviewed. Some trouble with R hearing.   Known COPD and HFpEF. Daughter endorsing increased isolation. Pt has not left her house since 12/2016 then again last week (only to come to doctor). Pt endorses "just don't want to leave house". She is fine with family and friends visiting her but doesn't enjoy visiting family/friends, or leaving the house to go to the store or restaurant etc.   Voiding well without incontinence. Normal stools without constipation.   Preventative: Colon cancer screening - declines colonoscopy or colon cancer screening. Denies BM habit changes or blood in stool.  Breast cancer screening - declines mammogram. Aware of risks. DEXA - bone density scan 11/2010 - severe.Takes calcium/vit D 1 tab QOD.  Pneumovax 2006, 2015 Flu yearly Last tetanus shot thinks 2006 shingrix - discussed Advanced directives: scanned into chart (12/2013) [place] and [place]. Would not want prolonged life support, does not want feeding tube. DNR and MOST form filled out previously.  Seat belt use discussed.  Sunscreen use discussed. No changing moles on skin.  Ex smoker - quit 08/2016 Alcohol - none  Caffeine: 2-3 coffee cups/day Lives alone, no pets. Daughter (Maria Simon) nearby, granddaughter nearby Edu: 10th grade  Activity: no regular exercise  Diet: some water, fruits/vegetables daily  Relevant past medical, surgical, family and social history reviewed and updated as indicated. Interim medical history  since our last visit reviewed. Allergies and medications reviewed and updated. Outpatient Medications Prior to Visit  Medication Sig Dispense Refill  . aspirin 81 MG tablet Take 81 mg by mouth daily.     Marland Kitchen atorvastatin (LIPITOR) 80 MG tablet TAKE 1 TABLET BY MOUTH EVERY DAY 90 tablet 3  . benzonatate (TESSALON) 100 MG capsule Take 1 capsule (100 mg total) by mouth 3 (three) times daily as needed for cough. 30 capsule 0  . bisacodyl (DULCOLAX) 5 MG EC tablet Take 1 tablet (5 mg total) by mouth daily as needed for moderate constipation. 30 tablet 0  . budesonide (PULMICORT) 0.25 MG/2ML nebulizer solution USE 1 VIAL VIA NEBULIZER TWICE DAILY 60 mL 3  . Calcium Carb-Cholecalciferol (CALCIUM-VITAMIN D) 600-400 MG-UNIT TABS Take 1 tablet by mouth every other day.     . enalapril (VASOTEC) 10 MG tablet TAKE 1 TABLET BY MOUTH EVERY DAY 90 tablet 2  . levalbuterol (XOPENEX) 1.25 MG/0.5ML nebulizer solution Take 1.25 mg by nebulization every 6 (six) hours as needed for wheezing or shortness of breath. 1 each 1  . metoprolol tartrate (LOPRESSOR) 50 MG tablet TAKE 1 TABLET BY MOUTH TWICE DAILY 180 tablet 3  . Multiple Vitamin (MULTIVITAMIN) tablet Take 1 tablet by mouth daily. Centrum Silver    . PROAIR RESPICLICK 469 (90 Base) MCG/ACT AEPB INHALE 2 PUFFS INTO THE LUNGS EVERY 6 HOURS AS NEEDED 1 each 3  . furosemide (LASIX) 20 MG tablet Take 0.5 tablets (10 mg total) by mouth daily  as needed for fluid or edema. 30 tablet 6   No facility-administered medications prior to visit.      Per HPI unless specifically indicated in ROS section below Review of Systems     Objective:    BP 122/62 (BP Location: Left Arm, Patient Position: Sitting, Cuff Size: Normal)   Pulse 88   Temp 98.2 F (36.8 C) (Oral)   Ht 5\' 1"  (1.549 m)   Wt 121 lb (54.9 kg)   SpO2 93%   BMI 22.86 kg/m   Wt Readings from Last 3 Encounters:  06/20/17 121 lb (54.9 kg)  06/15/17 120 lb (54.4 kg)  12/15/16 119 lb (54 kg)      Physical Exam  Constitutional: She is oriented to person, place, and time. She appears well-developed and well-nourished. No distress.  HENT:  Head: Normocephalic and atraumatic.  Right Ear: Hearing, tympanic membrane, external ear and ear canal normal.  Left Ear: Hearing, tympanic membrane, external ear and ear canal normal.  Nose: Nose normal.  Mouth/Throat: Uvula is midline, oropharynx is clear and moist and mucous membranes are normal. No oropharyngeal exudate, posterior oropharyngeal edema or posterior oropharyngeal erythema.  Eyes: Pupils are equal, round, and reactive to light. Conjunctivae and EOM are normal. No scleral icterus.  Neck: Normal range of motion. Neck supple. Carotid bruit is not present. No thyromegaly present.  Cardiovascular: Normal rate, regular rhythm, normal heart sounds and intact distal pulses.  No murmur heard. Pulses:      Radial pulses are 2+ on the right side, and 2+ on the left side.  Pulmonary/Chest: Effort normal and breath sounds normal. No respiratory distress. She has no wheezes. She has no rales.  Abdominal: Soft. Bowel sounds are normal. She exhibits no distension and no mass. There is no tenderness. There is no rebound and no guarding.  Musculoskeletal: Normal range of motion. She exhibits no edema.  Lymphadenopathy:    She has no cervical adenopathy.  Neurological: She is alert and oriented to person, place, and time.  CN grossly intact, station and gait intact  Skin: Skin is warm and dry. No rash noted.  Psychiatric: She has a normal mood and affect. Her behavior is normal. Judgment and thought content normal.  Nursing note and vitals reviewed.  Results for orders placed or performed in visit on 06/15/17  VITAMIN D 25 Hydroxy (Vit-D Deficiency, Fractures)  Result Value Ref Range   VITD 45.64 30.00 - 100.00 ng/mL  CBC with Differential/Platelet  Result Value Ref Range   WBC 6.7 4.0 - 10.5 K/uL   RBC 5.04 3.87 - 5.11 Mil/uL   Hemoglobin  12.5 12.0 - 15.0 g/dL   HCT 38.5 36.0 - 46.0 %   MCV 76.3 (L) 78.0 - 100.0 fl   MCHC 32.4 30.0 - 36.0 g/dL   RDW 18.9 (H) 11.5 - 15.5 %   Platelets 156.0 150.0 - 400.0 K/uL   Neutrophils Relative % 74.7 43.0 - 77.0 %   Lymphocytes Relative 11.3 (L) 12.0 - 46.0 %   Monocytes Relative 7.6 3.0 - 12.0 %   Eosinophils Relative 1.2 0.0 - 5.0 %   Basophils Relative 5.2 (H) 0.0 - 3.0 %   Neutro Abs 5.0 1.4 - 7.7 K/uL   Lymphs Abs 0.8 0.7 - 4.0 K/uL   Monocytes Absolute 0.5 0.1 - 1.0 K/uL   Eosinophils Absolute 0.1 0.0 - 0.7 K/uL   Basophils Absolute 0.3 (H) 0.0 - 0.1 K/uL  Hemoglobin A1c  Result Value Ref Range  Hgb A1c MFr Bld 6.1 4.6 - 6.5 %  Comprehensive metabolic panel  Result Value Ref Range   Sodium 142 135 - 145 mEq/L   Potassium 4.7 3.5 - 5.1 mEq/L   Chloride 104 96 - 112 mEq/L   CO2 31 19 - 32 mEq/L   Glucose, Bld 128 (H) 70 - 99 mg/dL   BUN 18 6 - 23 mg/dL   Creatinine, Ser 0.85 0.40 - 1.20 mg/dL   Total Bilirubin 0.8 0.2 - 1.2 mg/dL   Alkaline Phosphatase 90 39 - 117 U/L   AST 25 0 - 37 U/L   ALT 17 0 - 35 U/L   Total Protein 7.1 6.0 - 8.3 g/dL   Albumin 3.7 3.5 - 5.2 g/dL   Calcium 9.6 8.4 - 10.5 mg/dL   GFR 68.11 >60.00 mL/min  Lipid panel  Result Value Ref Range   Cholesterol 104 0 - 200 mg/dL   Triglycerides 63.0 0.0 - 149.0 mg/dL   HDL 33.90 (L) >39.00 mg/dL   VLDL 12.6 0.0 - 40.0 mg/dL   LDL Cholesterol 57 0 - 99 mg/dL   Total CHOL/HDL Ratio 3    NonHDL 69.71       Assessment & Plan:   Problem List Items Addressed This Visit    Advanced care planning/counseling discussion    Advanced directives: scanned into chart (12/2013) HCPOA - Maria Simon [place] and [place]. Would not want prolonged life support, does not want feeding tube. DNR and MOST form filled out previously.       CAD (coronary artery disease)    On aspirin, statin.       Relevant Medications   furosemide (LASIX) 20 MG tablet   CHF (congestive heart failure) (HCC)    Chronic, stable.  Seems euvolemic. Continue lasix 10mg  daily.       Relevant Medications   furosemide (LASIX) 20 MG tablet   DNR (do not resuscitate)   Emphysema of lung ([place])    Mod-severe. Continues pulmicort nebs bid regularly.      Ex-smoker    Congratulated on ongoing abstinence - approaching 1 yr anniversary.       History of TIAs    Continue aspirin, statin.      HLD (hyperlipidemia) - Primary    Chronic, stable. Continue lipitor 80mg  daily. The ASCVD Risk score Mikey Bussing DC Jr., et al., 2013) failed to calculate for the following reasons:   The 2013 ASCVD risk score is only valid for ages 65 to 75       Relevant Medications   furosemide (LASIX) 20 MG tablet   HTN (hypertension)    Chronic, stable. Continue current regimen.      Relevant Medications   furosemide (LASIX) 20 MG tablet   Isolation (social)    Reviewed with patient benefits of social engagement including preserving mental acuity.       Left kidney mass    Pt declined further evaluation of this. Consider updated UA next visit.       Osteoporosis    Took 11 months bisphosphonate, stopped due to groin pain. Continues calcium and vitamin D. Not interested in further eval/treatment. Encouraged regular weight bearing exercise.       Prediabetes    Encouraged avoiding added sugars in diet.      Status post thoracic aortic aneurysm repair    CTA 08/2016 stable.           Meds ordered this encounter  Medications  . furosemide (LASIX) 20 MG tablet  Sig: Take 0.5 tablets (10 mg total) by mouth daily as needed for fluid or edema.    Dispense:  30 tablet    Refill:  6   No orders of the defined types were placed in this encounter.   Follow up plan: Return in about 6 months (around 12/20/2017) for follow up visit.  Ria Bush, MD

## 2017-06-20 NOTE — Assessment & Plan Note (Addendum)
CTA 08/2016 stable.

## 2017-06-20 NOTE — Assessment & Plan Note (Signed)
Mod-severe. Continues pulmicort nebs bid regularly.

## 2017-06-20 NOTE — Assessment & Plan Note (Signed)
Chronic, stable. Continue lipitor 80mg  daily. The ASCVD Risk score Maria Bussing DC Jr., et al., 2013) failed to calculate for the following reasons:   The 2013 ASCVD risk score is only valid for ages 12 to 4

## 2017-06-20 NOTE — Assessment & Plan Note (Signed)
Congratulated on ongoing abstinence - approaching 1 yr anniversary.

## 2017-06-20 NOTE — Assessment & Plan Note (Signed)
Advanced directives: scanned into chart (12/2013) HCPOA - [name] Shoe and [place] Knight. Would not want prolonged life support, does not want feeding tube.DNR and MOST form filled out previously.  

## 2017-07-11 ENCOUNTER — Telehealth: Payer: Self-pay | Admitting: Family Medicine

## 2017-07-11 NOTE — Telephone Encounter (Signed)
Copied from [place] [id number]. Topic: Quick Communication - Rx Refill/Question >> Jul 11, 2017 10:08 AM Boyd Kerbs wrote: Medication:  budesonide (PULMICORT) 0.25 MG/2ML nebulizer solution  Pt. Is wanting to change to different pharmacy - now at [place] the patient contacted their pharmacy? No. (Agent: If no, request that the patient contact the pharmacy for the refill.) Preferred Pharmacy (with phone number or street name):   [facility], [place] [address] Phone: [phone] Fax: [phone]   Agent: Please be advised that RX refills may take up to 3 business days. We ask that you follow-up with your pharmacy.

## 2017-07-11 NOTE — Telephone Encounter (Signed)
Spoke with Loma Sousa at [facility], who states that Pulmicort would be retrieved from [facility] per request of pt.

## 2017-07-12 MED ORDER — BUDESONIDE 0.25 MG/2ML IN SUSP: RESPIRATORY_TRACT | 3 refills | Status: DC

## 2017-07-12 NOTE — Addendum Note (Signed)
Addended by: Matilde Sprang on: 07/12/2017 05:53 PM   Modules accepted: Orders

## 2017-07-12 NOTE — Telephone Encounter (Signed)
Pt is needing a new script sent to [facility] for insurance to cover it. She states Psychiatric nurse cover a transfer of the prescription.

## 2017-07-16 MED ORDER — BUDESONIDE 0.25 MG/2ML IN SUSP: RESPIRATORY_TRACT | 3 refills | Status: DC

## 2017-07-16 MED ORDER — BUDESONIDE 0.25 MG/2ML IN SUSP: 0.2500 mg | Freq: Two times a day (BID) | RESPIRATORY_TRACT | 5 refills | Status: DC

## 2017-07-16 NOTE — Telephone Encounter (Signed)
Pt still waiting for her Pulmicort rx refilled. They are needing a PA for the medication.

## 2017-07-16 NOTE — Addendum Note (Signed)
Addended by: Pilar Grammes on: 07/16/2017 03:45 PM   Modules accepted: Orders

## 2017-07-16 NOTE — Addendum Note (Signed)
Addended by: Pilar Grammes on: 07/16/2017 02:45 PM   Modules accepted: Orders

## 2017-07-16 NOTE — Telephone Encounter (Signed)
Sent rx for pulmicort neb to the pharmacy. I have not seen a request for a PA for this medication and I have been doing Lisa's box all week.

## 2017-08-17 ENCOUNTER — Telehealth: Payer: Self-pay | Admitting: Family Medicine

## 2017-08-17 NOTE — Telephone Encounter (Signed)
Copied from [place] [id number]. Topic: Inquiry >> Aug 17, 2017 10:25 AM Pricilla Handler wrote: Reason for CRM: Patient called requesting a refill of Budesonide (PULMICORT) 0.25 MG/2ML nebulizer solution. Patient's preferred pharmacy is [place], [place] - [place] [phone] (Phone)   [phone] (Fax).       Thank You!!!

## 2017-08-17 NOTE — Telephone Encounter (Signed)
Pulmicort called to [facility] in May with 5 refills.Message left on pt.'s voice mail.

## 2017-09-08 ENCOUNTER — Other Ambulatory Visit: Payer: Self-pay | Admitting: Family Medicine

## 2017-10-01 ENCOUNTER — Telehealth: Payer: Self-pay | Admitting: Family Medicine

## 2017-10-01 NOTE — Telephone Encounter (Signed)
Medication: metoprolol tartrate (LOPRESSOR) 50 MG tablet, enalapril (VASOTEC) 10 MG tablet    Preferred Pharm: [place], [place] [phone] (Phone)  [phone] (Fax)    Pt was advised that RX refills may take up to 3 business days. Pt will be out of meds tomorrow.

## 2017-10-02 NOTE — Telephone Encounter (Signed)
Pt is going into [facility] pharmacy to pick up metroprolol she stated she changed pharmacies and didn't know that was filled

## 2017-10-02 NOTE — Telephone Encounter (Signed)
Left VM to CB; clarify refill request for Lopressor. Filled 09/10/17  #180  3 refills

## 2017-10-16 ENCOUNTER — Telehealth: Payer: Self-pay | Admitting: Family Medicine

## 2017-10-16 MED ORDER — ENALAPRIL MALEATE 10 MG PO TABS: 10.0000 mg | ORAL_TABLET | Freq: Every day | ORAL | 0 refills | Status: DC

## 2017-10-16 NOTE — Telephone Encounter (Signed)
Copied from [place] [id number]. Topic: Quick Communication - Rx Refill/Question >> Oct 16, 2017 11:42 AM Selinda Flavin B, NT wrote: Medication: enalapril (VASOTEC) 10 MG tablet  Has the patient contacted their pharmacy? Yes.   (Agent: If no, request that the patient contact the pharmacy for the refill.) (Agent: If yes, when and what did the pharmacy advise?)  Preferred Pharmacy (with phone number or street name): [facility] [place], [place]  Agent: Please be advised that RX refills may take up to 3 business days. We ask that you follow-up with your pharmacy.

## 2017-12-28 ENCOUNTER — Ambulatory Visit: Payer: Medicare Other | Admitting: Family Medicine

## 2018-01-09 ENCOUNTER — Ambulatory Visit (INDEPENDENT_AMBULATORY_CARE_PROVIDER_SITE_OTHER): Payer: Medicare Other | Admitting: Family Medicine

## 2018-01-09 ENCOUNTER — Encounter: Payer: Self-pay | Admitting: Family Medicine

## 2018-01-09 ENCOUNTER — Ambulatory Visit (INDEPENDENT_AMBULATORY_CARE_PROVIDER_SITE_OTHER)
Admission: RE | Admit: 2018-01-09 | Discharge: 2018-01-09 | Disposition: A | Discharge status: Home / Self Care | Payer: Medicare Other | Source: Ambulatory Visit | Attending: Family Medicine | Admitting: Family Medicine

## 2018-01-09 VITALS — BP 114/66 | HR 105 | Temp 98.0°F | Ht 61.0 in | Wt 117.5 lb

## 2018-01-09 DIAGNOSIS — Z604 Social exclusion and rejection: Secondary | ICD-10-CM | POA: Diagnosis not present

## 2018-01-09 DIAGNOSIS — M79604 Pain in right leg: Secondary | ICD-10-CM

## 2018-01-09 DIAGNOSIS — Z23 Encounter for immunization: Secondary | ICD-10-CM | POA: Diagnosis not present

## 2018-01-09 DIAGNOSIS — M79661 Pain in right lower leg: Secondary | ICD-10-CM | POA: Diagnosis not present

## 2018-01-09 DIAGNOSIS — S8991XA Unspecified injury of right lower leg, initial encounter: Secondary | ICD-10-CM | POA: Diagnosis not present

## 2018-01-09 DIAGNOSIS — S99911A Unspecified injury of right ankle, initial encounter: Secondary | ICD-10-CM | POA: Diagnosis not present

## 2018-01-09 DIAGNOSIS — I2510 Atherosclerotic heart disease of native coronary artery without angina pectoris: Secondary | ICD-10-CM

## 2018-01-09 DIAGNOSIS — M7989 Other specified soft tissue disorders: Secondary | ICD-10-CM | POA: Diagnosis not present

## 2018-01-09 MED ORDER — SERTRALINE HCL 25 MG PO TABS: 25.0000 mg | ORAL_TABLET | Freq: Every day | ORAL | 6 refills | Status: DC

## 2018-01-09 MED ORDER — ALBUTEROL SULFATE 108 (90 BASE) MCG/ACT IN AEPB: 2.0000 | INHALATION_SPRAY | Freq: Four times a day (QID) | RESPIRATORY_TRACT | 3 refills | Status: AC | PRN

## 2018-01-09 NOTE — Progress Notes (Signed)
BP 114/66 (BP Location: Left Arm, Patient Position: Sitting, Cuff Size: Normal)   Pulse (!) 105   Temp 98 F (36.7 C) (Oral)   Ht 5\' 1"  (1.549 m)   Wt 117 lb 8 oz (53.3 kg)   SpO2 94%   BMI 22.20 kg/m    CC: 6 mo f/u visit Subjective:    Patient ID: Maria Simon, female    DOB: 1933/12/24, 84 y.o.   MRN: [id number]  HPI: Maria Simon is a 84 y.o. female presenting on 01/09/2018 for Follow-up (Here for 6 mo f/u. Pt accompanied by her daughter, Lynelle Smoke. )   DOI: 01/09/2018 Tripped over foot stool at home. Twisted R ankle, swollen and painful since. Pain posterior foot. No bruising. Tried tylenol for this. Painful to bear weight on foot - in wheelchair today.   Ongoing social isolation. Today is first day she leaves her house since last visit here. She denies depression or sadness.  Ongoing chronic dyspnea with exertion.   Relevant past medical, surgical, family and social history reviewed and updated as indicated. Interim medical history since our last visit reviewed. Allergies and medications reviewed and updated. Outpatient Medications Prior to Visit  Medication Sig Dispense Refill  . aspirin 81 MG tablet Take 81 mg by mouth daily.     Marland Kitchen atorvastatin (LIPITOR) 80 MG tablet TAKE 1 TABLET BY MOUTH EVERY DAY 90 tablet 3  . benzonatate (TESSALON) 100 MG capsule Take 1 capsule (100 mg total) by mouth 3 (three) times daily as needed for cough. 30 capsule 0  . bisacodyl (DULCOLAX) 5 MG EC tablet Take 1 tablet (5 mg total) by mouth daily as needed for moderate constipation. 30 tablet 0  . budesonide (PULMICORT) 0.25 MG/2ML nebulizer solution Take 2 mLs (0.25 mg total) by nebulization 2 (two) times daily. USE 1 VIAL VIA NEBULIZER TWICE DAILY 120 mL 5  . Calcium Carb-Cholecalciferol (CALCIUM-VITAMIN D) 600-400 MG-UNIT TABS Take 1 tablet by mouth every other day.     . enalapril (VASOTEC) 10 MG tablet Take 1 tablet (10 mg total) by mouth daily. 90 tablet 0  . furosemide (LASIX) 20 MG  tablet Take 0.5 tablets (10 mg total) by mouth daily as needed for fluid or edema. 30 tablet 6  . levalbuterol (XOPENEX) 1.25 MG/0.5ML nebulizer solution Take 1.25 mg by nebulization every 6 (six) hours as needed for wheezing or shortness of breath. 1 each 1  . metoprolol tartrate (LOPRESSOR) 50 MG tablet TAKE 1 TABLET BY MOUTH TWICE DAILY 180 tablet 1  . Multiple Vitamin (MULTIVITAMIN) tablet Take 1 tablet by mouth daily. Centrum Silver    . PROAIR RESPICLICK 976 (90 Base) MCG/ACT AEPB INHALE 2 PUFFS INTO THE LUNGS EVERY 6 HOURS AS NEEDED 1 each 3   No facility-administered medications prior to visit.      Per HPI unless specifically indicated in ROS section below Review of Systems     Objective:    BP 114/66 (BP Location: Left Arm, Patient Position: Sitting, Cuff Size: Normal)   Pulse (!) 105   Temp 98 F (36.7 C) (Oral)   Ht 5\' 1"  (1.549 m)   Wt 117 lb 8 oz (53.3 kg)   SpO2 94%   BMI 22.20 kg/m   Wt Readings from Last 3 Encounters:  01/09/18 117 lb 8 oz (53.3 kg)  06/20/17 121 lb (54.9 kg)  06/15/17 120 lb (54.4 kg)    Physical Exam  Constitutional: She appears well-developed and well-nourished. No distress.  Musculoskeletal:  Normal range of motion. She exhibits no edema.  2+ DP bilaterally L ankle WNL Tender to palpation R lateral malleolus as well as at proximal R fibular head FROM at ankles Reproducible tenderness referred to popliteal region with testing medial ankle laxity No pain at base of 5th MT, at navicular bone, at achilles, at heel, no pain with calcaneal squeeze   Neurological: She is alert.  Sensation intact  Skin: Skin is warm and dry. No rash noted. No erythema.  Psychiatric: She has a normal mood and affect. Her behavior is normal.  Nursing note and vitals reviewed.  Results for orders placed or performed in visit on 06/15/17  VITAMIN D 25 Hydroxy (Vit-D Deficiency, Fractures)  Result Value Ref Range   VITD 45.64 30.00 - 100.00 ng/mL  CBC with  Differential/Platelet  Result Value Ref Range   WBC 6.7 4.0 - 10.5 K/uL   RBC 5.04 3.87 - 5.11 Mil/uL   Hemoglobin 12.5 12.0 - 15.0 g/dL   HCT 38.5 36.0 - 46.0 %   MCV 76.3 (L) 78.0 - 100.0 fl   MCHC 32.4 30.0 - 36.0 g/dL   RDW 18.9 (H) 11.5 - 15.5 %   Platelets 156.0 150.0 - 400.0 K/uL   Neutrophils Relative % 74.7 43.0 - 77.0 %   Lymphocytes Relative 11.3 (L) 12.0 - 46.0 %   Monocytes Relative 7.6 3.0 - 12.0 %   Eosinophils Relative 1.2 0.0 - 5.0 %   Basophils Relative 5.2 (H) 0.0 - 3.0 %   Neutro Abs 5.0 1.4 - 7.7 K/uL   Lymphs Abs 0.8 0.7 - 4.0 K/uL   Monocytes Absolute 0.5 0.1 - 1.0 K/uL   Eosinophils Absolute 0.1 0.0 - 0.7 K/uL   Basophils Absolute 0.3 (H) 0.0 - 0.1 K/uL  Hemoglobin A1c  Result Value Ref Range   Hgb A1c MFr Bld 6.1 4.6 - 6.5 %  Comprehensive metabolic panel  Result Value Ref Range   Sodium 142 135 - 145 mEq/L   Potassium 4.7 3.5 - 5.1 mEq/L   Chloride 104 96 - 112 mEq/L   CO2 31 19 - 32 mEq/L   Glucose, Bld 128 (H) 70 - 99 mg/dL   BUN 18 6 - 23 mg/dL   Creatinine, Ser 0.85 0.40 - 1.20 mg/dL   Total Bilirubin 0.8 0.2 - 1.2 mg/dL   Alkaline Phosphatase 90 39 - 117 U/L   AST 25 0 - 37 U/L   ALT 17 0 - 35 U/L   Total Protein 7.1 6.0 - 8.3 g/dL   Albumin 3.7 3.5 - 5.2 g/dL   Calcium 9.6 8.4 - 10.5 mg/dL   GFR 68.11 >60.00 mL/min  Lipid panel  Result Value Ref Range   Cholesterol 104 0 - 200 mg/dL   Triglycerides 63.0 0.0 - 149.0 mg/dL   HDL 33.90 (L) >39.00 mg/dL   VLDL 12.6 0.0 - 40.0 mg/dL   LDL Cholesterol 57 0 - 99 mg/dL   Total CHOL/HDL Ratio 3    NonHDL 69.71       Assessment & Plan:   Problem List Items Addressed This Visit    Social isolation    Will trial low dose SSRI hopeful to help motivation and possible anhedonia.       Right leg pain - Primary    After fall yesterday - check films r/o fibular or ankle fracture.  Supportive care reviewed - elevation, rest.  Update if not improving with treatment.       Relevant Orders  DG Tibia/Fibula Right   DG Ankle Complete Right    Other Visit Diagnoses    Need for influenza vaccination       Relevant Orders   Flu Vaccine QUAD 36+ mos IM (Completed)       Meds ordered this encounter  Medications  . Albuterol Sulfate (PROAIR RESPICLICK) 829 (90 Base) MCG/ACT AEPB    Sig: Take 2 puffs by mouth every 6 (six) hours as needed (dyspnea).    Dispense:  1 each    Refill:  3  . sertraline (ZOLOFT) 25 MG tablet    Sig: Take 1 tablet (25 mg total) by mouth daily.    Dispense:  30 tablet    Refill:  6   Orders Placed This Encounter  Procedures  . DG Tibia/Fibula Right    Standing Status:   Future    Number of Occurrences:   1    Standing Expiration Date:   03/12/2019    Order Specific Question:   Reason for Exam (SYMPTOM  OR DIAGNOSIS REQUIRED)    Answer:   R proximal fibular pain after fall yesterday    Order Specific Question:   Preferred imaging location?    Answer:   [facility]    Order Specific Question:   Radiology Contrast Protocol - do NOT remove file path    Answer:   \\[email or web address]\Radiant\DXFluoroContrastProtocols.pdf  . DG Ankle Complete Right    Standing Status:   Future    Number of Occurrences:   1    Standing Expiration Date:   03/12/2019    Order Specific Question:   Reason for Exam (SYMPTOM  OR DIAGNOSIS REQUIRED)    Answer:   R lat malleolar pain after fall yesterday    Order Specific Question:   Preferred imaging location?    Answer:   [facility]    Order Specific Question:   Radiology Contrast Protocol - do NOT remove file path    Answer:   \\[email or web address]\Radiant\DXFluoroContrastProtocols.pdf  . Flu Vaccine QUAD 36+ mos IM    Follow up plan: Return in about 6 months (around 07/10/2018) for medicare wellness visit.  Ria Bush, MD

## 2018-01-09 NOTE — Patient Instructions (Signed)
Trial sertraline 25mg  daily for the next month. If helpful, continue this medication.  xrays of right leg today. Good to see you today.  Return in 6 months for wellness visit.

## 2018-01-09 NOTE — Assessment & Plan Note (Signed)
After fall yesterday - check films r/o fibular or ankle fracture.  Supportive care reviewed - elevation, rest.  Update if not improving with treatment.

## 2018-01-09 NOTE — Assessment & Plan Note (Signed)
Will trial low dose SSRI hopeful to help motivation and possible anhedonia.

## 2018-01-10 ENCOUNTER — Telehealth: Payer: Self-pay | Admitting: Family Medicine

## 2018-01-10 NOTE — Telephone Encounter (Signed)
Pt calling for results of her foot xray she had done 11.6.19. Please advise

## 2018-01-10 NOTE — Telephone Encounter (Signed)
Spoke with pt and her daughter, Lynelle Smoke, notifying them Dr. Darnell Level has not reviewed results yet.  But we will contact her as soon as he does. Verbalizes understanding.

## 2018-01-16 ENCOUNTER — Telehealth: Payer: Self-pay | Admitting: Family Medicine

## 2018-01-16 MED ORDER — ENALAPRIL MALEATE 10 MG PO TABS: 10.0000 mg | ORAL_TABLET | Freq: Every day | ORAL | 1 refills | Status: DC

## 2018-01-16 NOTE — Telephone Encounter (Signed)
E-scribed. Spoke with pt notifying her refill was sent in.  Verbalizes understanding and expresses her thanks.

## 2018-01-16 NOTE — Telephone Encounter (Signed)
Pt called office to get a Rx refill request sent over to [facility] for the Enalapril.

## 2018-01-22 ENCOUNTER — Other Ambulatory Visit: Payer: Self-pay | Admitting: Family Medicine

## 2018-01-22 NOTE — Telephone Encounter (Signed)
Pharmacy requesting new rx with dx code.    Added dx code:  J43.2 to rx and e-scribed.

## 2018-03-19 ENCOUNTER — Telehealth: Payer: Self-pay | Admitting: Family Medicine

## 2018-03-19 MED ORDER — ATORVASTATIN CALCIUM 80 MG PO TABS: 80.0000 mg | ORAL_TABLET | Freq: Every day | ORAL | 1 refills | Status: AC

## 2018-03-19 NOTE — Telephone Encounter (Signed)
E-scribed refill 

## 2018-03-19 NOTE — Telephone Encounter (Signed)
Pt need refill for atorvastatin 80mg    Sent to [facility]   Pt is out of medication

## 2018-04-08 ENCOUNTER — Telehealth: Payer: Self-pay

## 2018-04-08 MED ORDER — METOPROLOL TARTRATE 50 MG PO TABS: 50.0000 mg | ORAL_TABLET | Freq: Two times a day (BID) | ORAL | 1 refills | Status: DC

## 2018-04-08 NOTE — Telephone Encounter (Signed)
Pt called asking for metoprolol refill to [facility]

## 2018-06-26 ENCOUNTER — Telehealth: Payer: Self-pay

## 2018-06-26 NOTE — Telephone Encounter (Signed)
I spoke with pt and she has written down her appts for  06/28/18 at 8 AM for Doxy.me with Lattie Haw for AWV. 07/02/18 at 9:05 lab visit at back of office.  07/05/18 at 11:30 Doxy.me AWV with Dr Darnell Level. Pt voiced understanding and nothing further needed.

## 2018-06-26 NOTE — Telephone Encounter (Signed)
[place] Night - Client Nonclinical Telephone Record [facility] Night - Client Client Site [place] Physician Ria Bush - MD Contact Type Call Who Is Calling Patient / Member / Family / Caregiver Caller Name Vernisha Bacote Caller Phone Number [phone] Call Type Message Only Information Provided Reason for Call Returning a Call from the Office Initial [place] states someone called re: a phone appt for her on Friday at 8:30. She got a call that her appt is in the office. Additional Comment Call Closed By: Kathlynn Grate Transaction Date/Time: 06/25/2018 5:36:09 PM (ET)

## 2018-06-28 ENCOUNTER — Ambulatory Visit (INDEPENDENT_AMBULATORY_CARE_PROVIDER_SITE_OTHER): Payer: Medicare Other

## 2018-06-28 DIAGNOSIS — Z0000 Encounter for general adult medical examination without abnormal findings: Secondary | ICD-10-CM | POA: Diagnosis not present

## 2018-06-28 NOTE — Progress Notes (Signed)
PCP notes:   Health maintenance:  No gaps identified.   Abnormal screenings:   Fall risk - hx of single fall Fall Risk  06/28/2018 06/15/2017 06/09/2016 04/30/2015 01/23/2014  Falls in the past year? 1 No No No No  Comment 01/09/2018 - fall with injury; tripped over stool; OV with PCP for eval and treatment - - - -  Number falls in past yr: 0 - - - -  Injury with Fall? 1 - - - -  Risk for fall due to : Impaired balance/gait - - - -   Patient concerns:   Patient reports intermittent shortness of breath when walking. Stopping, resting, and using rescue inhaler helps return breathing to normal. Onset was approx. 1.5 mths ago. Neg COVID screening. Resides alone. Family calls but does not visit.   Nurse concerns:  None  Next PCP appt:   07/05/18 @ 1130

## 2018-06-28 NOTE — Progress Notes (Signed)
Subjective:   Maria Simon is a 85 y.o. female who presents for Medicare Annual (Subsequent) preventive examination.  Review of Systems:  N/A Cardiac Risk Factors include: advanced age (>26men, >61 women);dyslipidemia;hypertension     Objective:     Vitals: There were no vitals taken for this visit.  There is no height or weight on file to calculate BMI.  Advanced Directives 06/28/2018 06/15/2017 08/22/2016 08/22/2016 06/09/2016 04/30/2015  Does Patient Have a Medical Advance Directive? Yes No Yes No No Yes  Type of Paramedic of [place];Living will - [place];Living will - - Living will  Does patient want to make changes to medical advance directive? - - No - Patient declined - - No - Patient declined  Copy of [place] in Chart? Yes - validated most recent copy scanned in chart (See row information) - Yes - - No - copy requested  Would patient like information on creating a medical advance directive? - Yes (MAU/Ambulatory/Procedural Areas - Information given) - - - -    Tobacco Social History   Tobacco Use  Smoking Status Former Smoker  . Packs/day: 0.50  . Years: 61.00  . Pack years: 30.50  . Types: Cigarettes  . Last attempt to quit: 08/04/2016  . Years since quitting: 1.8  Smokeless Tobacco Never Used  Tobacco Comment   1/2 PPD usually, but sometimes more     Counseling given: No Comment: 1/2 PPD usually, but sometimes more   Clinical Intake:  Pre-visit preparation completed: Yes  Pain : No/denies pain Pain Score: 0-No pain     Nutritional Risks: None Diabetes: No  How often do you need to have someone help you when you read instructions, pamphlets, or other written materials from your doctor or pharmacy?: 1 - Never What is the last grade level you completed in school?: 10th grade  Interpreter Needed?: No  Comments: pt is a widow and lives alone Information entered by :: LPinson, LPN  Past  Medical History:  Diagnosis Date  . CAD (coronary artery disease)    class 1-2 angina  . COPD with acute bronchitis ([place]) 08/22/2016   Rhinovirus s/p hospitalization 2018  . History of chicken pox   . History of TIAs 1998  . HLD (hyperlipidemia)   . HTN (hypertension)   . Osteoporosis    h/o compression fx T11, L5 by CT scan, Dexa 11/2010 -3.9 spine, -3.5 femur  . PAD (peripheral artery disease) ([place])   . Prediabetes 05/26/2011  . Smoker    ? COPD  . Status post thoracic aortic aneurysm repair 2006   graft in place, refused CT f/u in past.  . Systolic and diastolic CHF, chronic ([place])    last ultrasound prior to 2011, EF 10-15%, dilated likely nonischemic CM with CHF, will need to [place] with cards   Past Surgical History:  Procedure Laterality Date  . AORTA - SUPERIOR MESENTERIC ARTERY BYPASS GRAFT    . APPENDECTOMY  1950  . CT SCAN  07/2005   patent TAA graft, small endoleak, enlarging aneurysm.  patent SMA/celiac artery graft.  stable dissection of L iliac arteries, emphysema, , osteopenia, compression fx T11, L5, refused further CT scans  . DEXA  11/2010   -3.9 spine, -3.5 femur  . THORACIC AORTIC ANEURYSM REPAIR  01/11/2005   first open then endovascular graft; GORE TAG ref: [id number]; Lot: [id number] Sammuel Hines at [facility])  . TOTAL ABDOMINAL HYSTERECTOMY  1973   ovaries out.  reason:  bleeding  . US ECHOCARDIOGRAPHY  11/7671   systolic and diastolic dysfunction, EF 41-93%, dilated L vent, CAD, degenerative mitral valve with regurg, aortic sclerosis, R vent dysfunction, pulm HTN  . VEIN LIGATION AND STRIPPING     Family History  Problem Relation Age of Onset  . Mental illness Mother        deceased suicide  . Cancer Neg Hx   . Diabetes Neg Hx   . Stroke Neg Hx   . Hypertension Neg Hx    Social History   Socioeconomic History  . Marital status: Widowed    Spouse name: Not on file  . Number of children: 8  . Years of education: 10th grade  . Highest education level: Not on file   Occupational History  . 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 Financial resource strain: Not on file  . Food insecurity:    Worry: Not on file    Inability: Not on file  . Transportation needs:    Medical: Not on file    Non-medical: Not on file  Tobacco Use  . Smoking status: Former Smoker    Packs/day: 0.50    Years: 61.00    Pack years: 30.50    Types: Cigarettes    Last attempt to quit: 08/04/2016    Years since quitting: 1.8  . Smokeless tobacco: Never Used  . Tobacco comment: 1/2 PPD usually, but sometimes more  Substance and Sexual Activity  . Alcohol use: No  . Drug use: No  . Sexual activity: Not Currently  Lifestyle  . Physical activity:    Days per week: Not on file    Minutes per session: Not on file  . Stress: Not on file  Relationships  . Social connections:    Talks on phone: Not on file    Gets together: Not on file    Attends religious service: Not on file    Active member of club or organization: Not on file    Attends meetings of clubs or organizations: Not on file    Relationship status: Not on file  Other Topics Concern  . Not on file  Social History Narrative   Caffeine: 2-3 coffee cups/day   Lives alone, no pets.   Daughter ([name]) nearby, granddaughter nearby   Edu: 10th grade   Activity: no regular exercise   Diet: some water, fruits/vegetables daily    Outpatient Encounter Medications as of 06/28/2018  Medication Sig  . Albuterol Sulfate (PROAIR RESPICLICK) 790 (90 Base) MCG/ACT AEPB Take 2 puffs by mouth every 6 (six) hours as needed (dyspnea).  Marland Kitchen aspirin 81 MG tablet Take 81 mg by mouth daily.   Marland Kitchen atorvastatin (LIPITOR) 80 MG tablet Take 1 tablet (80 mg total) by mouth daily.  . benzonatate (TESSALON) 100 MG capsule Take 1 capsule (100 mg total) by mouth 3 (three) times daily as needed for cough.  . bisacodyl (DULCOLAX) 5 MG EC tablet Take 1 tablet (5 mg total) by mouth daily as  needed for moderate constipation.  . budesonide (PULMICORT) 0.25 MG/2ML nebulizer solution USE 1 VIAL IN NEBULIZER TWICE DAILY.  DX CODE:  J43.2  . Calcium Carb-Cholecalciferol (CALCIUM-VITAMIN D) 600-400 MG-UNIT TABS Take 1 tablet by mouth every other day.   . enalapril (VASOTEC) 10 MG tablet Take 1 tablet (10 mg total) by mouth daily.  . furosemide (LASIX) 20 MG tablet Take 0.5 tablets (10  mg total) by mouth daily as needed for fluid or edema.  . levalbuterol (XOPENEX) 1.25 MG/0.5ML nebulizer solution Take 1.25 mg by nebulization every 6 (six) hours as needed for wheezing or shortness of breath.  . metoprolol tartrate (LOPRESSOR) 50 MG tablet Take 1 tablet (50 mg total) by mouth 2 (two) times daily.  . Multiple Vitamin (MULTIVITAMIN) tablet Take 1 tablet by mouth daily. Centrum Silver  . sertraline (ZOLOFT) 25 MG tablet Take 1 tablet (25 mg total) by mouth daily.   No facility-administered encounter medications on file as of 06/28/2018.     Activities of Daily Living In your present state of health, do you have any difficulty performing the following activities: 06/28/2018  Hearing? N  Vision? N  Difficulty concentrating or making decisions? N  Walking or climbing stairs? Y  Comment shortness of breath  Dressing or bathing? N  Doing errands, shopping? Y  Comment does not Physiological scientist and eating ? N  Using the Toilet? N  In the past six months, have you accidently leaked urine? N  Do you have problems with loss of bowel control? N  Managing your Medications? N  Managing your Finances? N  Housekeeping or managing your Housekeeping? N  Some recent data might be hidden    Patient Care Team: Ria Bush, MD as PCP - General (Family Medicine)    Assessment:   This is a routine wellness examination for [place].  Exercise Activities and Dietary recommendations Current Exercise Habits: The patient does not participate in regular exercise at present, Exercise limited by:  None identified  Goals    . Patient Stated     Starting 06/28/18, I will continue to take medications as prescribed.        Fall Risk Fall Risk  06/28/2018 06/15/2017 06/09/2016 04/30/2015 01/23/2014  Falls in the past year? 1 No No No No  Comment 01/09/2018 - fall with injury; tripped over stool; OV with PCP for eval and treatment - - - -  Number falls in past yr: 0 - - - -  Injury with Fall? 1 - - - -  Risk for fall due to : Impaired balance/gait - - - -   Depression Screen PHQ 2/9 Scores 06/28/2018 06/15/2017 06/09/2016 04/30/2015  PHQ - 2 Score 0 0 0 0  PHQ- 9 Score 0 0 - -     Cognitive Function MMSE - Mini Mental State Exam 06/28/2018 06/15/2017 06/09/2016  Orientation to time 5 5 5   Orientation to Place 5 5 5   Registration 3 3 3   Attention/ Calculation 0 0 0  Recall 3 3 3   Language- name 2 objects 0 0 0  Language- repeat 1 1 1   Language- follow 3 step command 0 3 3  Language- read & follow direction 0 0 0  Write a sentence 0 0 0  Copy design 0 0 0  Total score 17 20 20      PLEASE NOTE: A Mini-Cog screen was completed. Maximum score is 17. A value of 0 denotes this part of [place] MMSE was not completed or the patient failed this part of the Mini-Cog screening.   Mini-Cog Screening Orientation to Time - Max 5 pts Orientation to Place - Max 5 pts Registration - Max 3 pts Recall - Max 3 pts Language Repeat - Max 1 pts    Immunization History  Administered Date(s) Administered  . Influenza Split 11/25/2010, 12/01/2011  . Influenza,inj,Quad PF,6+ Mos 12/13/2012, 12/12/2013, 11/27/2014, 11/26/2015, 12/15/2016, 01/09/2018  .  Influenza-Unspecified 12/04/2016  . Pneumococcal Conjugate-13 12/12/2013  . Pneumococcal Polysaccharide-23 02/11/2006    Screening Tests Health Maintenance  Topic Date Due  . DTaP/Tdap/Td (1 - Tdap) 04/09/2025 (Originally 10/05/1952)  . TETANUS/TDAP  04/09/2025 (Originally 10/05/1952)  . INFLUENZA VACCINE  10/05/2018  . DEXA SCAN  Completed  . PNA vac  Low Risk Adult  Completed       Plan:     I have personally reviewed, addressed, and noted the following in the patient's chart:  A. Medical and social history B. Use of alcohol, tobacco or illicit drugs  C. Current medications and supplements D. Functional ability and status E.  Nutritional status F.  Physical activity G. Advance directives H. List of other physicians I.  Hospitalizations, surgeries, and ER visits in previous 12 months J.  Vitals (unless it is a telemedicine encounter) K. Screenings to include hearing, vision, cognitive, depression L. Referrals and appointments   In addition, I have reviewed and discussed with patient certain preventive protocols, quality metrics, and best practice recommendations. A written personalized care plan for preventive services and recommendations were provided to patient.  With patient's permission, we connected on 06/28/18 at  8:00 AM EDT by a video enabled telemedicine application. Two patient identifiers were used to ensure the encounter occurred with the correct person. .   Patient was in home and writer was in office.   Signed,   Lindell Noe, MHA, BS, LPN Health Coach

## 2018-06-28 NOTE — Patient Instructions (Signed)
Ms. Reasons , Thank you for taking time to come for your Medicare Wellness Visit. I appreciate your ongoing commitment to your health goals. Please review the following plan we discussed and let me know if I can assist you in the future.   These are the goals we discussed: Goals    . Patient Stated     Starting 06/28/18, I will continue to take medications as prescribed.        This is a list of the screening recommended for you and due dates:  Health Maintenance  Topic Date Due  . DTaP/Tdap/Td vaccine (1 - Tdap) 04/09/2025*  . Tetanus Vaccine  04/09/2025*  . Flu Shot  10/05/2018  . DEXA scan (bone density measurement)  Completed  . Pneumonia vaccines  Completed  *Topic was postponed. The date shown is not the original due date.   Preventive Care for Adults  A healthy lifestyle and preventive care can promote health and wellness. Preventive health guidelines for adults include the following key practices.  . A routine yearly physical is a good way to check with your health care provider about your health and preventive screening. It is a chance to share any concerns and updates on your health and to receive a thorough exam.  . Visit your dentist for a routine exam and preventive care every 6 months. Brush your teeth twice a day and floss once a day. Good oral hygiene prevents tooth decay and gum disease.  . The frequency of eye exams is based on your age, health, family medical history, use  of contact lenses, and other factors. Follow your health care provider's recommendations for frequency of eye exams.  . Eat a healthy diet. Foods like vegetables, fruits, whole grains, low-fat dairy products, and lean protein foods contain the nutrients you need without too many calories. Decrease your intake of foods high in solid fats, added sugars, and salt. Eat the right amount of calories for you. Get information about a proper diet from your health care provider, if necessary.  . Regular  physical exercise is one of the most important things you can do for your health. Most adults should get at least 150 minutes of moderate-intensity exercise (any activity that increases your heart rate and causes you to sweat) each week. In addition, most adults need muscle-strengthening exercises on 2 or more days a week.  Silver Sneakers may be a benefit available to you. To determine eligibility, you may visit the website: www.silversneakers.com or contact program at 7323027398 Mon-Fri between 8AM-8PM.   . Maintain a healthy weight. The body mass index (BMI) is a screening tool to identify possible weight problems. It provides an estimate of body fat based on height and weight. Your health care provider can find your BMI and can help you achieve or maintain a healthy weight.   For adults 20 years and older: ? A BMI below 18.5 is considered underweight. ? A BMI of 18.5 to 24.9 is normal. ? A BMI of 25 to 29.9 is considered overweight. ? A BMI of 30 and above is considered obese.   . Maintain normal blood lipids and cholesterol levels by exercising and minimizing your intake of saturated fat. Eat a balanced diet with plenty of fruit and vegetables. Blood tests for lipids and cholesterol should begin at age 8 and be repeated every 5 years. If your lipid or cholesterol levels are high, you are over 50, or you are at high risk for heart disease, you may need  your cholesterol levels checked more frequently. Ongoing high lipid and cholesterol levels should be treated with medicines if diet and exercise are not working.  . If you smoke, find out from your health care provider how to quit. If you do not use tobacco, please do not start.  . If you choose to drink alcohol, please do not consume more than 2 drinks per day. One drink is considered to be 12 ounces (355 mL) of beer, 5 ounces (148 mL) of wine, or 1.5 ounces (44 mL) of liquor.  . If you are 57-56 years old, ask your health care provider if  you should take aspirin to prevent strokes.  . Use sunscreen. Apply sunscreen liberally and repeatedly throughout the day. You should seek shade when your shadow is shorter than you. Protect yourself by wearing long sleeves, pants, a wide-brimmed hat, and sunglasses year round, whenever you are outdoors.  . Once a month, do a whole body skin exam, using a mirror to look at the skin on your back. Tell your health care provider of new moles, moles that have irregular borders, moles that are larger than a pencil eraser, or moles that have changed in shape or color.

## 2018-07-02 ENCOUNTER — Other Ambulatory Visit: Payer: Self-pay | Admitting: Family Medicine

## 2018-07-02 ENCOUNTER — Other Ambulatory Visit: Payer: Self-pay

## 2018-07-02 ENCOUNTER — Other Ambulatory Visit (INDEPENDENT_AMBULATORY_CARE_PROVIDER_SITE_OTHER): Payer: Medicare Other

## 2018-07-02 DIAGNOSIS — E7800 Pure hypercholesterolemia, unspecified: Secondary | ICD-10-CM

## 2018-07-02 DIAGNOSIS — I10 Essential (primary) hypertension: Secondary | ICD-10-CM

## 2018-07-02 DIAGNOSIS — R7303 Prediabetes: Secondary | ICD-10-CM | POA: Diagnosis not present

## 2018-07-02 DIAGNOSIS — M810 Age-related osteoporosis without current pathological fracture: Secondary | ICD-10-CM

## 2018-07-02 DIAGNOSIS — D696 Thrombocytopenia, unspecified: Secondary | ICD-10-CM

## 2018-07-02 DIAGNOSIS — N2889 Other specified disorders of kidney and ureter: Secondary | ICD-10-CM

## 2018-07-02 LAB — POCT URINALYSIS DIPSTICK
Bilirubin, UA: NEGATIVE
Blood, UA: NEGATIVE
Glucose, UA: NEGATIVE
Ketones, UA: NEGATIVE
Leukocytes, UA: NEGATIVE
Nitrite, UA: NEGATIVE
Protein, UA: POSITIVE — AB
Spec Grav, UA: >=1.03 — AB (ref 1.010–1.025)
Urobilinogen, UA: 0.2 E.U./dL
pH, UA: 6 (ref 5.0–8.0)

## 2018-07-02 LAB — CBC WITH DIFFERENTIAL/PLATELET
Basophils Absolute: 0 10*3/uL (ref 0.0–0.1)
Basophils Relative: 0.6 % (ref 0.0–3.0)
Eosinophils Absolute: 0.1 10*3/uL (ref 0.0–0.7)
Eosinophils Relative: 1.4 % (ref 0.0–5.0)
HCT: 28.2 % — ABNORMAL LOW (ref 36.0–46.0)
Hemoglobin: 8.4 g/dL — ABNORMAL LOW (ref 12.0–15.0)
Lymphocytes Relative: 13.8 % (ref 12.0–46.0)
Lymphs Abs: 0.9 10*3/uL (ref 0.7–4.0)
MCHC: 29.8 g/dL — ABNORMAL LOW (ref 30.0–36.0)
MCV: 61.6 fl — ABNORMAL LOW (ref 78.0–100.0)
Monocytes Absolute: 0.6 10*3/uL (ref 0.1–1.0)
Monocytes Relative: 9.3 % (ref 3.0–12.0)
Neutro Abs: 5.1 10*3/uL (ref 1.4–7.7)
Neutrophils Relative %: 74.9 % (ref 43.0–77.0)
Platelets: 167 10*3/uL (ref 150.0–400.0)
RBC: 4.58 Mil/uL (ref 3.87–5.11)
RDW: 21.8 % — ABNORMAL HIGH (ref 11.5–15.5)
WBC: 6.9 10*3/uL (ref 4.0–10.5)

## 2018-07-02 LAB — COMPREHENSIVE METABOLIC PANEL
ALT: 11 U/L (ref 0–35)
AST: 24 U/L (ref 0–37)
Albumin: 3.2 g/dL — ABNORMAL LOW (ref 3.5–5.2)
Alkaline Phosphatase: 68 U/L (ref 39–117)
BUN: 16 mg/dL (ref 6–23)
CO2: 27 mEq/L (ref 19–32)
Calcium: 8.7 mg/dL (ref 8.4–10.5)
Chloride: 103 mEq/L (ref 96–112)
Creatinine, Ser: 0.77 mg/dL (ref 0.40–1.20)
GFR: 71.64 mL/min (ref >60.00)
Glucose, Bld: 134 mg/dL — ABNORMAL HIGH (ref 70–99)
Potassium: 3.5 mEq/L (ref 3.5–5.1)
Sodium: 140 mEq/L (ref 135–145)
Total Bilirubin: 1 mg/dL (ref 0.2–1.2)
Total Protein: 6.1 g/dL (ref 6.0–8.3)

## 2018-07-02 LAB — LIPID PANEL
Cholesterol: 65 mg/dL (ref 0–200)
HDL: 23.9 mg/dL — ABNORMAL LOW (ref >39.00)
LDL Cholesterol: 30 mg/dL (ref 0–99)
NonHDL: 40.96
Total CHOL/HDL Ratio: 3
Triglycerides: 56 mg/dL (ref 0.0–149.0)
VLDL: 11.2 mg/dL (ref 0.0–40.0)

## 2018-07-02 LAB — TSH: TSH: 3.51 u[IU]/mL (ref 0.35–4.50)

## 2018-07-02 LAB — HEMOGLOBIN A1C: Hgb A1c MFr Bld: 6.1 % (ref 4.6–6.5)

## 2018-07-02 LAB — VITAMIN D 25 HYDROXY (VIT D DEFICIENCY, FRACTURES): VITD: 38.58 ng/mL (ref 30.00–100.00)

## 2018-07-02 NOTE — Addendum Note (Signed)
Addended by: Ellamae Sia on: 07/02/2018 11:59 AM   Modules accepted: Orders

## 2018-07-02 NOTE — Addendum Note (Signed)
Addended by: Ria Bush on: 07/02/2018 07:37 AM   Modules accepted: Orders

## 2018-07-05 ENCOUNTER — Ambulatory Visit (INDEPENDENT_AMBULATORY_CARE_PROVIDER_SITE_OTHER): Payer: Medicare Other | Admitting: Family Medicine

## 2018-07-05 ENCOUNTER — Encounter: Payer: Self-pay | Admitting: Family Medicine

## 2018-07-05 ENCOUNTER — Ambulatory Visit: Payer: Medicare Other | Admitting: Family Medicine

## 2018-07-05 VITALS — BP 112/64 | HR 90 | Ht 61.0 in

## 2018-07-05 DIAGNOSIS — Z66 Do not resuscitate: Secondary | ICD-10-CM | POA: Diagnosis not present

## 2018-07-05 DIAGNOSIS — Z7189 Other specified counseling: Secondary | ICD-10-CM | POA: Diagnosis not present

## 2018-07-05 DIAGNOSIS — I10 Essential (primary) hypertension: Secondary | ICD-10-CM | POA: Diagnosis not present

## 2018-07-05 DIAGNOSIS — N2889 Other specified disorders of kidney and ureter: Secondary | ICD-10-CM

## 2018-07-05 DIAGNOSIS — R7303 Prediabetes: Secondary | ICD-10-CM

## 2018-07-05 DIAGNOSIS — E7800 Pure hypercholesterolemia, unspecified: Secondary | ICD-10-CM | POA: Diagnosis not present

## 2018-07-05 DIAGNOSIS — J432 Centrilobular emphysema: Secondary | ICD-10-CM | POA: Diagnosis not present

## 2018-07-05 DIAGNOSIS — Z8673 Personal history of transient ischemic attack (TIA), and cerebral infarction without residual deficits: Secondary | ICD-10-CM

## 2018-07-05 DIAGNOSIS — D649 Anemia, unspecified: Secondary | ICD-10-CM | POA: Diagnosis not present

## 2018-07-05 DIAGNOSIS — I5032 Chronic diastolic (congestive) heart failure: Secondary | ICD-10-CM

## 2018-07-05 DIAGNOSIS — M810 Age-related osteoporosis without current pathological fracture: Secondary | ICD-10-CM | POA: Diagnosis not present

## 2018-07-05 DIAGNOSIS — Z9889 Other specified postprocedural states: Secondary | ICD-10-CM

## 2018-07-05 DIAGNOSIS — Z604 Social exclusion and rejection: Secondary | ICD-10-CM

## 2018-07-05 DIAGNOSIS — Z8679 Personal history of other diseases of the circulatory system: Secondary | ICD-10-CM

## 2018-07-05 MED ORDER — FERROUS SULFATE 325 (65 FE) MG PO TABS: 325.0000 mg | ORAL_TABLET | Freq: Every day | ORAL | 1 refills | Status: AC

## 2018-07-05 NOTE — Assessment & Plan Note (Signed)
Overall stable period.  

## 2018-07-05 NOTE — Assessment & Plan Note (Signed)
Encouraged avoiding added sugars in the diet.

## 2018-07-05 NOTE — Assessment & Plan Note (Signed)
Advanced directives: scanned into chart (12/2013) Maria Simon and Maria Simon. Would not want prolonged life support, does not want feeding tube.DNR and MOST form filled out previously.

## 2018-07-05 NOTE — Assessment & Plan Note (Signed)
Stable period on budesonide and PRN proair.

## 2018-07-05 NOTE — Assessment & Plan Note (Signed)
Chronic, stable. Continue current regimen. Kidney function stable.

## 2018-07-05 NOTE — Assessment & Plan Note (Signed)
Has declined further eval/treatment in the past.

## 2018-07-05 NOTE — Assessment & Plan Note (Signed)
On aspirin and statin for h/o this - due to below new anemia concerns I recommended she stop aspirin.

## 2018-07-05 NOTE — Assessment & Plan Note (Signed)
Continues SSRI.

## 2018-07-05 NOTE — Assessment & Plan Note (Addendum)
Recent UA without blood. Previously declined any further evaluation and she wanted to avoid surgery at all costs. See below.

## 2018-07-05 NOTE — Assessment & Plan Note (Addendum)
Newly noted marked anemia on labs this year (Hgb from 12.5 --> 8.4). She also endorses some exertional dyspnea. No blood in urine, denies vaginal bleed. Discussed possible causes and further need to evaluate GI tract. Will have her complete iFOB, start OTC iron, hold aspirin for now. She declines GI eval for now, wants to await iFOB. I offered to talk with daughter about anemia and plan. I called [name] and reviewed above.

## 2018-07-05 NOTE — Assessment & Plan Note (Signed)
This has been previously reviewed.

## 2018-07-05 NOTE — Assessment & Plan Note (Signed)
Chronic, stable. Continue high dose lipitor. Consider lowering dose.

## 2018-07-05 NOTE — Progress Notes (Signed)
Virtual visit completed through [employer]. Due to national recommendations of social distancing due to [place] 19, a virtual visit is felt to be most appropriate for this patient at this time. Interactive audio and video telecommunications were attempted between myself and Maria Simon,  patient did not have access to video capability.  We continued and completed visit with audio only.  Time: 11:30am - 11:58am   Patient location: home Provider location: [place] at [facility], office If any vitals were documented, they were collected by patient at home unless specified below.    BP 112/64 (BP Location: Right Arm, Patient Position: Sitting, Cuff Size: Normal)   Pulse 90   Ht _0  (1.549 m)   SpO2 92%   BMI 22.20 kg/m    CC: AMW f/u visit Subjective:    Patient ID: Maria Simon, female    DOB: 1933/06/20, 85 y.o.   MRN: [id number]  HPI: Maria Simon is a 85 y.o. female presenting on 07/05/2018 for Annual Exam (Pt 2. )   Maria Simon daughter lives above her.   Saw Maria Simon last week for medicare wellness visit. Note reviewed.  Noticing increased dyspnea with exertion. Resting and albuterol inhaler helps. No chest pain, HA, dizziness, presyncope. Ongoing symptoms for the past 1.5 months. Denies blood in stool or urine. Or vaginal bleeding.    Known COPD and HFpEF. No known h/o CAD or CVA.   Preventative: Colon cancer screening - had previously aged out but prior to this had declined any colon cancer screening for years.  Breast cancer screening - declines mammogram. Aware of risks. DEXA - bone density scan 11/2010 - severe.Takes calcium/vit D 1 tab QOD.  Pneumovax 2007, prevnar 13 2015 Flu yearly Last tetanus shot thinks 2006 shingrix - discussed Advanced directives: scanned into chart (12/2013) [place] and [place]. Would not want prolonged life support, does not want feeding tube.DNR and MOST form filled out previously.  Seat belt use discussed.   Sunscreen use discussed. No changing moles on skin.  Ex smoker - quit 08/2016 Alcohol - none Dentist - doesn't see Eye exam - doesn't see Bowels - no constipation Bladder - no incontinence  Caffeine: 2-3 coffee cups/day Lives alone, no pets. Daughter (Maria Simon) nearby, granddaughter nearby Edu: 10th grade  Activity: no regular exercise  Diet: some water, fruits/vegetables daily     Relevant past medical, surgical, family and social history reviewed and updated as indicated. Interim medical history since our last visit reviewed. Allergies and medications reviewed and updated. Outpatient Medications Prior to Visit  Medication Sig Dispense Refill  . Albuterol Sulfate (PROAIR RESPICLICK) 947 (90 Base) MCG/ACT AEPB Take 2 puffs by mouth every 6 (six) hours as needed (dyspnea). 1 each 3  . aspirin 81 MG tablet Take 81 mg by mouth daily.     Marland Kitchen atorvastatin (LIPITOR) 80 MG tablet Take 1 tablet (80 mg total) by mouth daily. 90 tablet 1  . benzonatate (TESSALON) 100 MG capsule Take 1 capsule (100 mg total) by mouth 3 (three) times daily as needed for cough. 30 capsule 0  . bisacodyl (DULCOLAX) 5 MG EC tablet Take 1 tablet (5 mg total) by mouth daily as needed for moderate constipation. 30 tablet 0  . budesonide (PULMICORT) 0.25 MG/2ML nebulizer solution USE 1 VIAL IN NEBULIZER TWICE DAILY.  DX CODE:  J43.2 120 mL 5  . Calcium Carb-Cholecalciferol (CALCIUM-VITAMIN D) 600-400 MG-UNIT TABS Take 1 tablet by mouth every other day.     Marland Kitchen  enalapril (VASOTEC) 10 MG tablet Take 1 tablet (10 mg total) by mouth daily. 90 tablet 1  . furosemide (LASIX) 20 MG tablet Take 0.5 tablets (10 mg total) by mouth daily as needed for fluid or edema. 30 tablet 6  . levalbuterol (XOPENEX) 1.25 MG/0.5ML nebulizer solution Take 1.25 mg by nebulization every 6 (six) hours as needed for wheezing or shortness of breath. 1 each 1  . metoprolol tartrate (LOPRESSOR) 50 MG tablet Take 1 tablet (50 mg total) by mouth 2 (two) times  daily. 180 tablet 1  . Multiple Vitamin (MULTIVITAMIN) tablet Take 1 tablet by mouth daily. Centrum Silver    . sertraline (ZOLOFT) 25 MG tablet Take 1 tablet (25 mg total) by mouth daily. 30 tablet 6   No facility-administered medications prior to visit.      Per HPI unless specifically indicated in ROS section below Review of Systems Objective:    BP 112/64 (BP Location: Right Arm, Patient Position: Sitting, Cuff Size: Normal)   Pulse 90   Ht _0  (1.549 m)   SpO2 92%   BMI 22.20 kg/m   Wt Readings from Last 3 Encounters:  01/09/18 117 lb 8 oz (53.3 kg)  06/20/17 121 lb (54.9 kg)  06/15/17 120 lb (54.4 kg)     Physical exam: Gen: alert, NAD, not ill appearing Pulm: speaks in complete sentences without increased work of breathing Psych: normal mood, normal thought content      Results for orders placed or performed in visit on 07/02/18  CBC with Differential/Platelet  Result Value Ref Range   WBC 6.9 4.0 - 10.5 K/uL   RBC 4.58 3.87 - 5.11 Mil/uL   Hemoglobin 8.4 Repeated and verified X2. (L) 12.0 - 15.0 g/dL   HCT 28.2 (L) 36.0 - 46.0 %   MCV 61.6 Repeated and verified X2. (L) 78.0 - 100.0 fl   MCHC 29.8 (L) 30.0 - 36.0 g/dL   RDW 21.8 (H) 11.5 - 15.5 %   Platelets 167.0 150.0 - 400.0 K/uL   Neutrophils Relative % 74.9 43.0 - 77.0 %   Lymphocytes Relative 13.8 12.0 - 46.0 %   Monocytes Relative 9.3 3.0 - 12.0 %   Eosinophils Relative 1.4 0.0 - 5.0 %   Basophils Relative 0.6 0.0 - 3.0 %   Neutro Abs 5.1 1.4 - 7.7 K/uL   Lymphs Abs 0.9 0.7 - 4.0 K/uL   Monocytes Absolute 0.6 0.1 - 1.0 K/uL   Eosinophils Absolute 0.1 0.0 - 0.7 K/uL   Basophils Absolute 0.0 0.0 - 0.1 K/uL  Hemoglobin A1c  Result Value Ref Range   Hgb A1c MFr Bld 6.1 4.6 - 6.5 %  TSH  Result Value Ref Range   TSH 3.51 0.35 - 4.50 uIU/mL  Lipid panel  Result Value Ref Range   Cholesterol 65 0 - 200 mg/dL   Triglycerides 56.0 0.0 - 149.0 mg/dL   HDL 23.90 (L) >39.00 mg/dL   VLDL 11.2 0.0 - 40.0  mg/dL   LDL Cholesterol 30 0 - 99 mg/dL   Total CHOL/HDL Ratio 3    NonHDL 40.96   Comprehensive metabolic panel  Result Value Ref Range   Sodium 140 135 - 145 mEq/L   Potassium 3.5 3.5 - 5.1 mEq/L   Chloride 103 96 - 112 mEq/L   CO2 27 19 - 32 mEq/L   Glucose, Bld 134 (H) 70 - 99 mg/dL   BUN 16 6 - 23 mg/dL   Creatinine, Ser 0.77 0.40 - 1.20  mg/dL   Total Bilirubin 1.0 0.2 - 1.2 mg/dL   Alkaline Phosphatase 68 39 - 117 U/L   AST 24 0 - 37 U/L   ALT 11 0 - 35 U/L   Total Protein 6.1 6.0 - 8.3 g/dL   Albumin 3.2 (L) 3.5 - 5.2 g/dL   Calcium 8.7 8.4 - 10.5 mg/dL   GFR 71.64 >60.00 mL/min  VITAMIN D 25 Hydroxy (Vit-D Deficiency, Fractures)  Result Value Ref Range   VITD 38.58 30.00 - 100.00 ng/mL  Urinalysis Dipstick  Result Value Ref Range   Color, UA orange    Clarity, UA hazy    Glucose, UA Negative Negative   Bilirubin, UA negative    Ketones, UA negative    Spec Grav, UA >=1.030 (A) 1.010 - 1.025   Blood, UA negative    pH, UA 6.0 5.0 - 8.0   Protein, UA Positive (A) Negative   Urobilinogen, UA 0.2 0.2 or 1.0 E.U./dL   Nitrite, UA negative    Leukocytes, UA Negative Negative   Appearance     Odor     Assessment & Plan:   Problem List Items Addressed This Visit    Social isolation    Continues SSRI.       Prediabetes    Encouraged avoiding added sugars in the diet.       Osteoporosis    Has declined further eval/treatment in the past.      Left kidney mass    Recent UA without blood. Previously declined any further evaluation and she wanted to avoid surgery at all costs. See below.       HTN (hypertension)    Chronic, stable. Continue current regimen. Kidney function stable.       HLD (hyperlipidemia)    Chronic, stable. Continue high dose lipitor. Consider lowering dose.       History of TIAs    On aspirin and statin for h/o this - due to below new anemia concerns I recommended she stop aspirin.       History of endograft repair of thoracic  aortic aneurysm (2005)   Emphysema of lung (HCC)    Stable period on budesonide and PRN proair.       DNR (do not resuscitate)    This has been previously reviewed.       CHF (congestive heart failure) (HCC)    Overall stable period.       Anemia - Primary    Newly noted marked anemia on labs this year (Hgb from 12.5 --> 8.4). She also endorses some exertional dyspnea. No blood in urine, denies vaginal bleed. Discussed possible causes and further need to evaluate GI tract. Will have her complete iFOB, start OTC iron, hold aspirin for now. She declines GI eval for now, wants to await iFOB. I offered to talk with daughter about anemia and plan. I called Maria Simon and reviewed above.       Relevant Medications   ferrous sulfate 325 (65 FE) MG tablet   Other Relevant Orders   Fecal occult blood, imunochemical   Advanced care planning/counseling discussion    Advanced directives: scanned into chart (12/2013) [place] and [place]. Would not want prolonged life support, does not want feeding tube.DNR and MOST form filled out previously.           Meds ordered this encounter  Medications  . ferrous sulfate 325 (65 FE) MG tablet    Sig: Take 1 tablet (325 mg  total) by mouth daily with breakfast.    Dispense:  90 tablet    Refill:  1   Orders Placed This Encounter  Procedures  . Fecal occult blood, imunochemical    Standing Status:   Future    Standing Expiration Date:   07/05/2019    Patient Instructions  You have a new anemia - hemoglobin is down to 8.4 (last year it was 12.5). This would explain your shortness of breath.  Stop aspirin for now. Start iron over the counter (ferrous sulfate 336m) once daily (this may make you constipated and can turn stools dark). Turn in stool kit mailed today to check for hidden blood in the stool.  I worry hidden bleeding is coming from the GI tract - and do recommend evaluation with GI doctor.  Let [place] if worsening shortness  of breath, chest pain, dizziness - all signs we may need a blood transfusion.     Follow up plan: No follow-ups on file.  JRia Bush MD

## 2018-07-05 NOTE — Patient Instructions (Addendum)
You have a new anemia - hemoglobin is down to 8.4 (last year it was 12.5). This would explain your shortness of breath.  Stop aspirin for now. Start iron over the counter (ferrous sulfate 332m) once daily (this may make you constipated and can turn stools dark). Turn in stool kit mailed today to check for hidden blood in the stool.  I worry hidden bleeding is coming from the GI tract - and do recommend evaluation with GI doctor.  Let [place] if worsening shortness of breath, chest pain, dizziness - all signs we may need a blood transfusion.

## 2018-07-08 ENCOUNTER — Other Ambulatory Visit: Payer: Medicare Other

## 2018-07-08 ENCOUNTER — Other Ambulatory Visit: Payer: Self-pay

## 2018-07-09 ENCOUNTER — Telehealth: Payer: Self-pay

## 2018-07-09 ENCOUNTER — Other Ambulatory Visit (INDEPENDENT_AMBULATORY_CARE_PROVIDER_SITE_OTHER): Payer: Medicare Other

## 2018-07-09 DIAGNOSIS — D500 Iron deficiency anemia secondary to blood loss (chronic): Secondary | ICD-10-CM

## 2018-07-09 DIAGNOSIS — D649 Anemia, unspecified: Secondary | ICD-10-CM

## 2018-07-09 LAB — FECAL OCCULT BLOOD, IMMUNOCHEMICAL: Fecal Occult Bld: POSITIVE — AB

## 2018-07-09 NOTE — Addendum Note (Signed)
Addended by: Ria Bush on: 07/09/2018 03:45 PM   Modules accepted: Orders

## 2018-07-09 NOTE — Telephone Encounter (Signed)
Noted.  See result note.  

## 2018-07-09 NOTE — Telephone Encounter (Signed)
[facility] called to report a positive IFOB test @1408  5.5.2020

## 2018-07-09 NOTE — Telephone Encounter (Addendum)
Again spoke with patient.  She notes in the past month that she did note large amt blood in commode and intermittent blood with wiping since then. Endorses h/o hemorrhoids.   Discussed her decision to not proceed with further evaluation. Discussed possible causes of ongoing GI blood loss from AVM to possible colon cancer. She is aware of possibility of cancer but feels that she would not pursue treatment if this was found. She also previously declined further evaluation for kidney mass found last year. Discussed options of CT imaging or just discussion with GI for options - declines at this time.   Spoke with daughter and discussed this as well.  Daughter thinks zoloft has been beneficial for mood - will continue at current dose.   Will set up blood transfusion - Lattie Haw plz start order for short stay 1 u pRBC blood transfusion.

## 2018-07-10 ENCOUNTER — Telehealth: Payer: Self-pay | Admitting: Family Medicine

## 2018-07-10 NOTE — Telephone Encounter (Signed)
Please refer to detailed phone encounter from today ( 07/10/18) regarding discussion and orders for moving forward with transfusion.

## 2018-07-10 NOTE — Telephone Encounter (Signed)
Tried calling x4, unable to reach anyone.

## 2018-07-10 NOTE — Telephone Encounter (Signed)
Nunzio Cory with Dr. Saralyn Pilar at [facility]) gives authorization given patients medical hx and acute symptoms to move forward with transfusion as requested per Dr. Synthia Innocent orders.   I immediately contacted Laverne at [place] and left a detailed message with permission and gave names above with correct phone contact information. I have asked that Blanca call me back after 9am tomorrow and let me know if anything further is needed on our end in order to move forward with scheduling.   Will forward to Dr. Darnell Level and Rosaria Ferries as an Juluis Rainier.

## 2018-07-10 NOTE — Telephone Encounter (Signed)
I called Maria Simon at [facility] to ask about availability for a Blood Transfusion for Maria Simon-phone#[phone], [phone] . She said there is a blood shortage right now and you have to call the Blood bank to get permission from them before they will allow transfusion. The phone number is [phone] will ask clinical questions and decide if they will allow. Get the persons name that you talk to if they say yes and then call Maria Simon back to get scheduled. Need Dr's order filled out to fax  to Maria Simon has copies of the order sheet.They denied a patient whose hemoglobin was 6 Maria Simon said.

## 2018-07-10 NOTE — Telephone Encounter (Signed)
Tried number given X 3 this pm.  This number transfers directly in to the cancer center.  Both parties that I spoke with have no idea why I was given their number and they do not know anything about this process.    I did finally get to the Triage nurse at the cancer center. She states that they only approve transfusions for their patients and cannot approve for anyone else.  She did give me the correct number to the blood bank at [facility] at [phone].    I spoke with Blood Bank agent and gave her full details on patient's symptoms and concerns.  She is consulting with pathologist and will be calling me back to discuss decision and next steps.

## 2018-07-10 NOTE — Telephone Encounter (Signed)
Wonderful thank you.

## 2018-07-10 NOTE — Telephone Encounter (Signed)
Fwd to [place]. Says she will handle this.

## 2018-07-11 NOTE — Telephone Encounter (Signed)
Spoke with patient and gave her details for blood transfusion tomorrow (07/12/18) at [facility] at [facility].  She is supposed to go straight in to Admitting.  She cannot have anyone stay with her, they can only drop her off at the front.   She can eat, drink and take her morning medications and may want to bring a snack with her as she will be there for approx. 3.5 hours.   Patient does mention having increased SOB X 2 episodes today with exertion and she had to use her albuterol inhaler which immediately helped.  She is not in any distress or having any breathing difficulty on the phone and denies any CP, nausea, vomiting, chest congestion/cough, fever, chills body aches, etc. She denies any weakness or dizziness.   She does report having some "diarrhea" off and on X 1 month.  Stating that is has improved and only has about 1 episode daily now.  She describes it as "when I pass gas a little liquid comes out, I call it a blow-out!".   Dr. Darnell Level made aware of above concerns and does not sound like acute concerning changes, should move forward with transfusion as scheduled tomorrow am.  Patient made aware.   I have been unsuccessful in reaching her daughter but did leave all details regarding infusion on her voicemail (cell, ok per DPR).  Also, asked her mother to continue to try and reach her this pm so that they can make plans for appointment time in am.  Patient verbalizes understanding of importance in keeping appointment for tomorrow.

## 2018-07-12 ENCOUNTER — Other Ambulatory Visit (HOSPITAL_COMMUNITY): Payer: Self-pay | Admitting: *Deleted

## 2018-07-12 ENCOUNTER — Ambulatory Visit (HOSPITAL_COMMUNITY)
Admission: RE | Admit: 2018-07-12 | Discharge: 2018-07-12 | Disposition: A | Discharge status: Home / Self Care | Payer: Medicare Other | Source: Ambulatory Visit | Attending: Family Medicine | Admitting: Family Medicine

## 2018-07-12 ENCOUNTER — Encounter (HOSPITAL_COMMUNITY): Payer: Self-pay | Admitting: Emergency Medicine

## 2018-07-12 ENCOUNTER — Other Ambulatory Visit: Payer: Self-pay

## 2018-07-12 ENCOUNTER — Emergency Department (HOSPITAL_COMMUNITY)
Admission: EM | Admit: 2018-07-12 | Discharge: 2018-07-12 | Discharge status: Against Medical Advice | Payer: Medicare Other | Attending: Emergency Medicine | Admitting: Emergency Medicine

## 2018-07-12 ENCOUNTER — Emergency Department (HOSPITAL_COMMUNITY): Payer: Medicare Other

## 2018-07-12 DIAGNOSIS — R0902 Hypoxemia: Secondary | ICD-10-CM | POA: Insufficient documentation

## 2018-07-12 DIAGNOSIS — Z5320 Procedure and treatment not carried out because of patient's decision for unspecified reasons: Secondary | ICD-10-CM | POA: Insufficient documentation

## 2018-07-12 DIAGNOSIS — R7989 Other specified abnormal findings of blood chemistry: Secondary | ICD-10-CM | POA: Diagnosis not present

## 2018-07-12 DIAGNOSIS — I10 Essential (primary) hypertension: Secondary | ICD-10-CM | POA: Diagnosis not present

## 2018-07-12 DIAGNOSIS — Z7982 Long term (current) use of aspirin: Secondary | ICD-10-CM | POA: Diagnosis not present

## 2018-07-12 DIAGNOSIS — I509 Heart failure, unspecified: Secondary | ICD-10-CM | POA: Diagnosis not present

## 2018-07-12 DIAGNOSIS — Z87891 Personal history of nicotine dependence: Secondary | ICD-10-CM | POA: Diagnosis not present

## 2018-07-12 DIAGNOSIS — R0600 Dyspnea, unspecified: Secondary | ICD-10-CM | POA: Insufficient documentation

## 2018-07-12 DIAGNOSIS — Z79899 Other long term (current) drug therapy: Secondary | ICD-10-CM | POA: Insufficient documentation

## 2018-07-12 DIAGNOSIS — I110 Hypertensive heart disease with heart failure: Secondary | ICD-10-CM | POA: Diagnosis not present

## 2018-07-12 DIAGNOSIS — J449 Chronic obstructive pulmonary disease, unspecified: Secondary | ICD-10-CM | POA: Diagnosis not present

## 2018-07-12 DIAGNOSIS — I2510 Atherosclerotic heart disease of native coronary artery without angina pectoris: Secondary | ICD-10-CM | POA: Insufficient documentation

## 2018-07-12 DIAGNOSIS — R0602 Shortness of breath: Secondary | ICD-10-CM | POA: Diagnosis present

## 2018-07-12 DIAGNOSIS — R778 Other specified abnormalities of plasma proteins: Secondary | ICD-10-CM

## 2018-07-12 LAB — CBC WITH DIFFERENTIAL/PLATELET
Abs Immature Granulocytes: 0 10*3/uL (ref 0.00–0.07)
Abs Immature Granulocytes: 0.03 10*3/uL (ref 0.00–0.07)
Basophils Absolute: 0 10*3/uL (ref 0.0–0.1)
Basophils Absolute: 0 10*3/uL (ref 0.0–0.1)
Basophils Relative: 0 %
Basophils Relative: 0 %
Eosinophils Absolute: 0.1 10*3/uL (ref 0.0–0.5)
Eosinophils Absolute: 0 10*3/uL (ref 0.0–0.5)
Eosinophils Relative: 1 %
Eosinophils Relative: 1 %
HCT: 32.6 % — ABNORMAL LOW (ref 36.0–46.0)
HCT: 36.6 % (ref 36.0–46.0)
Hemoglobin: 8.5 g/dL — ABNORMAL LOW (ref 12.0–15.0)
Hemoglobin: 9.6 g/dL — ABNORMAL LOW (ref 12.0–15.0)
Immature Granulocytes: 0 %
Lymphocytes Relative: 5 %
Lymphocytes Relative: 11 %
Lymphs Abs: 0.4 10*3/uL — ABNORMAL LOW (ref 0.7–4.0)
Lymphs Abs: 0.8 10*3/uL (ref 0.7–4.0)
MCH: 18.2 pg — ABNORMAL LOW (ref 26.0–34.0)
MCH: 19 pg — ABNORMAL LOW (ref 26.0–34.0)
MCHC: 26.1 g/dL — ABNORMAL LOW (ref 30.0–36.0)
MCHC: 26.2 g/dL — ABNORMAL LOW (ref 30.0–36.0)
MCV: 70 fL — ABNORMAL LOW (ref 80.0–100.0)
MCV: 72.5 fL — ABNORMAL LOW (ref 80.0–100.0)
Monocytes Absolute: 0.3 10*3/uL (ref 0.1–1.0)
Monocytes Absolute: 0.6 10*3/uL (ref 0.1–1.0)
Monocytes Relative: 4 %
Monocytes Relative: 8 %
Neutro Abs: 6.8 10*3/uL (ref 1.7–7.7)
Neutro Abs: 6.2 10*3/uL (ref 1.7–7.7)
Neutrophils Relative %: 90 %
Neutrophils Relative %: 80 %
Platelets: 168 10*3/uL (ref 150–400)
Platelets: 158 10*3/uL (ref 150–400)
RBC: 4.66 MIL/uL (ref 3.87–5.11)
RBC: 5.05 MIL/uL (ref 3.87–5.11)
RDW: 24 % — ABNORMAL HIGH (ref 11.5–15.5)
RDW: 24.4 % — ABNORMAL HIGH (ref 11.5–15.5)
WBC: 7.6 10*3/uL (ref 4.0–10.5)
WBC: 7.6 10*3/uL (ref 4.0–10.5)
nRBC: 0.4 % — ABNORMAL HIGH (ref 0.0–0.2)
nRBC: 1 /100 WBC — ABNORMAL HIGH
nRBC: 0.4 % — ABNORMAL HIGH (ref 0.0–0.2)

## 2018-07-12 LAB — BASIC METABOLIC PANEL
Anion gap: 12 (ref 5–15)
BUN: 11 mg/dL (ref 8–23)
CO2: 26 mmol/L (ref 22–32)
Calcium: 8.8 mg/dL — ABNORMAL LOW (ref 8.9–10.3)
Chloride: 102 mmol/L (ref 98–111)
Creatinine, Ser: 0.81 mg/dL (ref 0.44–1.00)
GFR calc Af Amer: >60 mL/min (ref >60)
GFR calc non Af Amer: >60 mL/min (ref >60)
Glucose, Bld: 136 mg/dL — ABNORMAL HIGH (ref 70–99)
Potassium: 3.9 mmol/L (ref 3.5–5.1)
Sodium: 140 mmol/L (ref 135–145)

## 2018-07-12 LAB — IRON AND TIBC
Iron: 86 ug/dL (ref 28–170)
Saturation Ratios: 20 % (ref 10.4–31.8)
TIBC: 426 ug/dL (ref 250–450)
UIBC: 340 ug/dL

## 2018-07-12 LAB — TROPONIN I: Troponin I: 0.36 ng/mL (ref <0.03)

## 2018-07-12 LAB — PREPARE RBC (CROSSMATCH)

## 2018-07-12 LAB — BRAIN NATRIURETIC PEPTIDE: B Natriuretic Peptide: 1232.5 pg/mL — ABNORMAL HIGH (ref 0.0–100.0)

## 2018-07-12 LAB — FERRITIN: Ferritin: 55 ng/mL (ref 11–307)

## 2018-07-12 LAB — ABO/RH: ABO/RH(D): O POS

## 2018-07-12 MED ORDER — SODIUM CHLORIDE 0.9% IV SOLUTION: Freq: Once | INTRAVENOUS | Status: DC

## 2018-07-12 MED ORDER — PREDNISONE 20 MG PO TABS
40.0000 mg | ORAL_TABLET | Freq: Once | ORAL | Status: AC
  Administered 2018-07-12: 40 mg via ORAL
  Filled 2018-07-12: qty 2

## 2018-07-12 MED ORDER — PREDNISONE 10 MG PO TABS: 20.0000 mg | ORAL_TABLET | Freq: Every day | ORAL | 0 refills | Status: DC

## 2018-07-12 MED ORDER — ALBUTEROL SULFATE HFA 108 (90 BASE) MCG/ACT IN AERS
2.0000 | INHALATION_SPRAY | Freq: Once | RESPIRATORY_TRACT | Status: AC
  Administered 2018-07-12: 14:00:00 2 via RESPIRATORY_TRACT
  Filled 2018-07-12: qty 6.7

## 2018-07-12 NOTE — ED Provider Notes (Signed)
3:22 PM Care assumed from Dr. Francia Greaves while waiting laboratory test.  Labs have returned showing concern for CHF exacerbation causing her shortness of breath and hypoxia today.  Patient's BNP is over 1200 when last reading was less than 200.  Patient also has elevation in her troponin.  Patient is on 2 L on my evaluation.  When patient was informed of her lab abnormalities and how this would necessitate admission due to concern for CHF exacerbation with hypoxia, patient reports she wants to go home.  She was informed that we cannot let her go home with oxygen and we are recommending admission.  Patient says that she understands and still wants to go home.  Patient advised that this will be AGAINST MEDICAL ADVICE and she could die if she does so.  Patient reports she understands these risks clearly.  Patient will be taken off of nasal cannula oxygen and observe for short period of time.  Patient will then be able to ambulate.  If patient still desires to go home, she will leave [place].   4:25 PM Patient was reassessed and her oxygen saturations have stabilized between 88 and 91 on room air.  Patient reports she still wants to go home and understands that hypoxia is detrimental to her.  Patient understands risk of death and still wants to go.  Patient will leave [place] as we are recommending admission for CHF exacerbation with positive troponin and new hypoxia.  Patient understands that she can call to return at any point and needs to follow-up with her PCP in the next 24 hours.   Clinical Impression: 1. Dyspnea, unspecified type   2. Acute on chronic congestive heart failure, unspecified heart failure type ([place])   3. Hypoxia   4. Elevated troponin     Disposition: AMA    New Prescriptions   PREDNISONE (DELTASONE) 10 MG TABLET    Take 2 tablets (20 mg total) by mouth daily.    Follow Up: Ria Bush, MD [place]  [address] [phone]  Call in 1 day        , Gwenyth Allegra, MD 07/12/18 [phone]

## 2018-07-12 NOTE — Progress Notes (Signed)
Pt here today for 1 unit PRBC.  Pt was SOB with exertion and had been at home prior to today.  Sats 93 prior to blood, pt stated she does not wear oxygen at home but has COPD.  Called to the room by the patient to go to the restroom.  The patient had noticeable increase in her shortness  of breath, had difficulty getting to and from the restroom, using accessory muscles to breathe and stated her shortness of breath was worse.  Pt had crackles on ausculation of lungs, sats remained 93 on room air.  Called and reported the above to Dr Danise Mina who stated to take her to the ER to have her evaluated.

## 2018-07-12 NOTE — ED Provider Notes (Signed)
[facility] EMERGENCY DEPARTMENT Provider Note   CSN: [id number] Arrival date & time: 07/12/18  1329    History   Chief Complaint No chief complaint on file.   HPI Maria Simon is a 85 y.o. female.     85 year old female with prior medical history as detailed below presents for evaluation of shortness of breath.  Patient has completed 1 unit PRBC transfusion.  Following her transfusion she reported increased dyspnea with exertion to the staff.  Patient reports that she did not use any albuterol this morning prior to coming in for her outpatient transfusion.  She reports that she is unsure as to the source of her anemia.  She did complete the entire transfusion.  She denies associated fever or chest pain.  The history is provided by the patient and medical records.  Shortness of Breath  Severity:  Mild Onset quality:  Gradual Duration:  3 hours Timing:  Constant Progression:  Worsening Context: activity   Relieved by:  Nothing Worsened by:  Nothing Ineffective treatments:  None tried Associated symptoms: no fever     Past Medical History:  Diagnosis Date  . CAD (coronary artery disease)    class 1-2 angina  . COPD with acute bronchitis ([place]) 08/22/2016   Rhinovirus s/p hospitalization 2018  . History of chicken pox   . History of TIAs 1998  . HLD (hyperlipidemia)   . HTN (hypertension)   . Osteoporosis    h/o compression fx T11, L5 by CT scan, Dexa 11/2010 -3.9 spine, -3.5 femur  . PAD (peripheral artery disease) ([place])   . Prediabetes 05/26/2011  . Smoker    ? COPD  . Status post thoracic aortic aneurysm repair 2006   graft in place, refused CT f/u in past.  . Systolic and diastolic CHF, chronic ([place])    last ultrasound prior to 2011, EF 10-15%, dilated likely nonischemic CM with CHF, will need to [place] with cards    Patient Active Problem List   Diagnosis Date Noted  . Anemia 07/05/2018  . Social isolation 06/20/2017  . Left kidney  mass 09/01/2016  . Abnormal EKG 08/22/2016  . History of endograft repair of thoracic aortic aneurysm (2005) 08/22/2016  . Emphysema of lung ([place]) 06/27/2016  . DNR (do not resuscitate) 01/23/2014  . Advanced care planning/counseling discussion 12/12/2013  . Medicare annual wellness visit, subsequent 05/31/2012  . Thrombocytopenia, unspecified ([place]) 05/24/2012  . Prediabetes 05/26/2011  . History of TIAs   . CAD (coronary artery disease)   . Osteoporosis   . HLD (hyperlipidemia)   . HTN (hypertension)   . Status post thoracic aortic aneurysm repair   . CHF (congestive heart failure) ([place])   . Ex-smoker     Past Surgical History:  Procedure Laterality Date  . AORTA - SUPERIOR MESENTERIC ARTERY BYPASS GRAFT    . APPENDECTOMY  1950  . CT SCAN  07/2005   patent TAA graft, small endoleak, enlarging aneurysm.  patent SMA/celiac artery graft.  stable dissection of L iliac arteries, emphysema, , osteopenia, compression fx T11, L5, refused further CT scans  . DEXA  11/2010   -3.9 spine, -3.5 femur  . THORACIC AORTIC ANEURYSM REPAIR  01/11/2005   first open then endovascular graft; GORE TAG ref: [id number]; Lot: [id number] [facility]el Hines [facility]  . TOTAL ABDOMINAL HYSTERECTOMY  1973   ovaries out.  reason: bleeding  . US ECHOCARDIOGRAPHY  04/353   systolic and diastolic dysfunction, EF 97-41%, dilated L vent, CAD, degenerative mitral  valve with regurg, aortic sclerosis, R vent dysfunction, pulm HTN  . VEIN LIGATION AND STRIPPING       OB History   No obstetric history on file.      Home Medications    Prior to Admission medications   Medication Sig Start Date End Date Taking? Authorizing Provider  Albuterol Sulfate (PROAIR RESPICLICK) 833 (90 Base) MCG/ACT AEPB Take 2 puffs by mouth every 6 (six) hours as needed (dyspnea). 01/09/18   Ria Bush, MD  aspirin 81 MG tablet Take 81 mg by mouth daily.     [provider]  atorvastatin (LIPITOR) 80 MG tablet Take 1 tablet (80 mg  total) by mouth daily. 03/19/18   Ria Bush, MD  benzonatate (TESSALON) 100 MG capsule Take 1 capsule (100 mg total) by mouth 3 (three) times daily as needed for cough. 02/02/17   Ria Bush, MD  bisacodyl (DULCOLAX) 5 MG EC tablet Take 1 tablet (5 mg total) by mouth daily as needed for moderate constipation. 08/28/16   Theodis Blaze, MD  budesonide (PULMICORT) 0.25 MG/2ML nebulizer solution USE 1 VIAL IN NEBULIZER TWICE DAILY.  DX CODE:  J43.2 01/22/18   Ria Bush, MD  Calcium Carb-Cholecalciferol (CALCIUM-VITAMIN D) 600-400 MG-UNIT TABS Take 1 tablet by mouth every other day.     [provider]  enalapril (VASOTEC) 10 MG tablet Take 1 tablet (10 mg total) by mouth daily. 01/16/18   Ria Bush, MD  ferrous sulfate 325 (65 FE) MG tablet Take 1 tablet (325 mg total) by mouth daily with breakfast. 07/05/18   Ria Bush, MD  furosemide (LASIX) 20 MG tablet Take 0.5 tablets (10 mg total) by mouth daily as needed for fluid or edema. 06/20/17   Ria Bush, MD  levalbuterol Penne Lash) 1.25 MG/0.5ML nebulizer solution Take 1.25 mg by nebulization every 6 (six) hours as needed for wheezing or shortness of breath. 08/28/16   Theodis Blaze, MD  metoprolol tartrate (LOPRESSOR) 50 MG tablet Take 1 tablet (50 mg total) by mouth 2 (two) times daily. 04/08/18   Ria Bush, MD  Multiple Vitamin (MULTIVITAMIN) tablet Take 1 tablet by mouth daily. Centrum Silver    [provider]  sertraline (ZOLOFT) 25 MG tablet Take 1 tablet (25 mg total) by mouth daily. 01/09/18   Ria Bush, MD    Family History Family History  Problem Relation Age of Onset  . Mental illness Mother        deceased suicide  . Cancer Neg Hx   . Diabetes Neg Hx   . Stroke Neg Hx   . Hypertension Neg Hx     Social History Social History   Tobacco Use  . Smoking status: Former Smoker    Packs/day: 0.50    Years: 61.00    Pack years: 30.50    Types: Cigarettes    Last  attempt to quit: 08/04/2016    Years since quitting: 1.9  . Smokeless tobacco: Never Used  . Tobacco comment: 1/2 PPD usually, but sometimes more  Substance Use Topics  . Alcohol use: No  . Drug use: No     Allergies   Fosamax [alendronate sodium]   Review of Systems Review of Systems  Constitutional: Negative for fever.  Respiratory: Positive for shortness of breath.   All other systems reviewed and are negative.    Physical Exam Updated Vital Signs There were no vitals taken for this visit.  Physical Exam Vitals signs and nursing note reviewed.  Constitutional:  General: She is not in acute distress.    Appearance: She is well-developed.  HENT:     Head: Normocephalic and atraumatic.  Eyes:     Conjunctiva/sclera: Conjunctivae normal.     Pupils: Pupils are equal, round, and reactive to light.  Neck:     Musculoskeletal: Normal range of motion and neck supple.  Cardiovascular:     Rate and Rhythm: Normal rate and regular rhythm.     Heart sounds: Normal heart sounds.  Pulmonary:     Effort: Pulmonary effort is normal. No respiratory distress.     Breath sounds: Normal breath sounds.  Abdominal:     General: There is no distension.     Palpations: Abdomen is soft.     Tenderness: There is no abdominal tenderness.  Musculoskeletal: Normal range of motion.        General: No deformity.  Skin:    General: Skin is warm and dry.  Neurological:     Mental Status: She is alert and oriented to person, place, and time.      ED Treatments / Results  Labs (all labs ordered are listed, but only abnormal results are displayed) Labs Reviewed  CBC WITH DIFFERENTIAL/PLATELET - Abnormal; Notable for the following components:      Result Value   Hemoglobin 9.6 (*)    MCV 72.5 (*)    MCH 19.0 (*)    MCHC 26.2 (*)    RDW 24.4 (*)    nRBC 0.4 (*)    All other components within normal limits  BASIC METABOLIC PANEL  TROPONIN I  BRAIN NATRIURETIC PEPTIDE    EKG  EKG Interpretation  Date/Time:  Friday Jul 12 2018 13:40:38 EDT Ventricular Rate:  88 PR Interval:    QRS Duration: 112 QT Interval:  396 QTC Calculation: 480 R Axis:   -39 Text Interpretation:  Sinus tachycardia Atrial premature complexes Borderline IVCD with LAD Nonspecific repol abnormality, diffuse leads Artifact in lead(s) III aVR aVF V1 V2 V3 V4 V5 V6 Confirmed by Dene Gentry [id number]) on 07/12/2018 2:23:27 PM   Radiology Dg Chest Port 1 View  Result Date: 07/12/2018 CLINICAL DATA:  Dyspnea. History of COPD. EXAM: PORTABLE CHEST 1 VIEW COMPARISON:  08/23/2016 chest radiographs and 08/22/2016 CTA FINDINGS: The patient is rotated to the right. A thoracic aortic stent graft is again noted for treatment of a large descending thoracic aortic aneurysm. The aneurysm is more prominent than on the prior study, however may be explained by rotation and increased magnification from portable AP technique on the current examination. There is chronic lung hyperinflation and interstitial coarsening. No definite airspace consolidation is identified although rotation limits assessment. No sizable pleural effusion or pneumothorax is evident. No acute osseous abnormality is identified. IMPRESSION: Rotated examination. COPD without definite evidence of acute airspace disease. Electronically Signed   By: Logan Bores M.D.   On: 07/12/2018 14:21    Procedures Procedures (including critical care time)  Medications Ordered in ED Medications  albuterol (VENTOLIN HFA) 108 (90 Base) MCG/ACT inhaler 2 puff (has no administration in time range)     Initial Impression / Assessment and Plan / ED Course  I have reviewed the triage vital signs and the nursing notes.  Pertinent labs & imaging results that were available during my care of the patient were reviewed by me and considered in my medical decision making (see chart for details).        MDM  Screen complete  Maria Simon was evaluated  in  Emergency Department on 07/12/2018 for the symptoms described in the history of present illness. She was evaluated in the context of the global COVID-19 pandemic, which necessitated consideration that the patient might be at risk for infection with the SARS-CoV-2 virus that causes COVID-19. Institutional protocols and algorithms that pertain to the evaluation of patients at risk for COVID-19 are in a state of rapid change based on information released by regulatory bodies including the CDC and federal and state organizations. These policies and algorithms were followed during the patient's care in the ED.  Patient is presenting for evaluation of dyspnea.  Patient presented following receiving 1 unit PRBCs as an outpatient.   Patient did skip her breathing treatments at home prior to arriving at the hospital for her transfusion.  Following treatment with albuterol in the ED she feels improved.  Screening chest x-ray and blood work does not reveal other significant acute pathology.  Patient desires discharge.  Importance of close follow-up is stressed.  Strict return precautions given and understood.  We will prescribe the patient a short course of prednisone given the likelihood of a mild COPD exacerbation.   Final Clinical Impressions(s) / ED Diagnoses   Final diagnoses:  Dyspnea, unspecified type    ED Discharge Orders         Ordered    predniSONE (DELTASONE) 10 MG tablet  Daily     07/12/18 1430           Valarie Merino, MD 07/12/18 1430

## 2018-07-12 NOTE — ED Triage Notes (Signed)
Pt arrives from medical day after receving 1 unit of blood she walked to the restroom and became short of breath. Pt denies any pain. Hx of copd

## 2018-07-12 NOTE — Discharge Instructions (Addendum)
Based on your work-up, we are concerned you having a CHF exacerbation causing your low oxygen saturations.  Your troponin which is a cardiac enzyme was also elevated.  We recommended admission however you want to leave [facility].  You clearly explained you understand the risk of death for leaving against our recommendations.  Please follow-up with your primary doctor in the next 24 hours.  Please return for any problem.  Follow-up with your regular care providers as instructed.

## 2018-07-13 ENCOUNTER — Other Ambulatory Visit: Payer: Self-pay | Admitting: Family Medicine

## 2018-07-13 LAB — TYPE AND SCREEN
ABO/RH(D): O POS
Antibody Screen: NEGATIVE
Unit division: 0

## 2018-07-13 LAB — BPAM RBC
Blood Product Expiration Date: 202005132359
ISSUE DATE / TIME: 202005081033
Unit Type and Rh: 9500

## 2018-07-15 ENCOUNTER — Telehealth: Payer: Self-pay | Admitting: Family Medicine

## 2018-07-15 NOTE — Telephone Encounter (Signed)
Noted. Thanks.

## 2018-07-15 NOTE — Telephone Encounter (Signed)
Pt seen in ER on Friday after blood transfusion with worsening shortness of breath. Found to have elevated troponin but refused further evaluation, left AMA. I talked to her on Friday afternoon - and recommended doubling up on lasix over the weekend.  Please call today for an update on symptoms, see how she did with doubled lasix dose for 3 days.   Please schedule in- office visit to review recent medical course and to discuss future plan. Please have her daughter come to appt with her.

## 2018-07-15 NOTE — Telephone Encounter (Signed)
Spoke with pt asking how she is doing. States sometimes the SOB is worse but does not feel as weak as she was. Says doubling the Lasix has helped.   Scheduled pt for ER f/u on 07/15/18 at 10:30 AM.

## 2018-07-16 ENCOUNTER — Telehealth: Payer: Self-pay | Admitting: *Deleted

## 2018-07-16 ENCOUNTER — Ambulatory Visit (INDEPENDENT_AMBULATORY_CARE_PROVIDER_SITE_OTHER): Payer: Medicare Other | Admitting: Family Medicine

## 2018-07-16 ENCOUNTER — Other Ambulatory Visit: Payer: Self-pay

## 2018-07-16 ENCOUNTER — Encounter: Payer: Self-pay | Admitting: Family Medicine

## 2018-07-16 VITALS — BP 120/82 | HR 79 | Temp 98.3°F | Ht 61.0 in | Wt 121.0 lb

## 2018-07-16 DIAGNOSIS — Z7189 Other specified counseling: Secondary | ICD-10-CM | POA: Insufficient documentation

## 2018-07-16 DIAGNOSIS — Z8679 Personal history of other diseases of the circulatory system: Secondary | ICD-10-CM

## 2018-07-16 DIAGNOSIS — Z604 Social exclusion and rejection: Secondary | ICD-10-CM

## 2018-07-16 DIAGNOSIS — I2510 Atherosclerotic heart disease of native coronary artery without angina pectoris: Secondary | ICD-10-CM | POA: Diagnosis not present

## 2018-07-16 DIAGNOSIS — R0602 Shortness of breath: Secondary | ICD-10-CM | POA: Diagnosis not present

## 2018-07-16 DIAGNOSIS — Z66 Do not resuscitate: Secondary | ICD-10-CM | POA: Diagnosis not present

## 2018-07-16 DIAGNOSIS — D649 Anemia, unspecified: Secondary | ICD-10-CM | POA: Diagnosis not present

## 2018-07-16 DIAGNOSIS — R195 Other fecal abnormalities: Secondary | ICD-10-CM | POA: Diagnosis not present

## 2018-07-16 DIAGNOSIS — I5032 Chronic diastolic (congestive) heart failure: Secondary | ICD-10-CM

## 2018-07-16 DIAGNOSIS — N2889 Other specified disorders of kidney and ureter: Secondary | ICD-10-CM | POA: Diagnosis not present

## 2018-07-16 DIAGNOSIS — Z9889 Other specified postprocedural states: Secondary | ICD-10-CM | POA: Diagnosis not present

## 2018-07-16 DIAGNOSIS — J432 Centrilobular emphysema: Secondary | ICD-10-CM | POA: Diagnosis not present

## 2018-07-16 MED ORDER — SERTRALINE HCL 25 MG PO TABS: 25.0000 mg | ORAL_TABLET | Freq: Every day | ORAL | 6 refills | Status: DC

## 2018-07-16 MED ORDER — FUROSEMIDE 20 MG PO TABS: 20.0000 mg | ORAL_TABLET | Freq: Every day | ORAL | 1 refills | Status: AC | PRN

## 2018-07-16 MED ORDER — SERTRALINE HCL 25 MG PO TABS: 25.0000 mg | ORAL_TABLET | Freq: Every day | ORAL | 1 refills | Status: AC

## 2018-07-16 NOTE — Assessment & Plan Note (Addendum)
H/o diastolic dysfunction by [place] 08/2016 - concern with worsening CHF due to progressive exertional dyspnea and at rest. See below. Update heart ultrasound. She declined further evaluation at ER and left AMA.

## 2018-07-16 NOTE — Assessment & Plan Note (Signed)
Completing prednisone burst by ER. Ongoing dyspnea as per below.

## 2018-07-16 NOTE — Telephone Encounter (Signed)
Covid-19 travel screening questions  Have you traveled in the last 14 days? If yes where? No Do you now or have you had a fever in the last 14 days? No Do you have any respiratory symptoms of shortness of breath or cough now or in the last 14 days? No Do you have any family members or close contacts with diagnosed or suspected Covid-19? No      

## 2018-07-16 NOTE — Assessment & Plan Note (Addendum)
Pt remains hesitant for aggressive evaluation or management.  Reviewed evaluation options for dyspnea, anemia, GI bleed.  Reviewed option of palliative care and hospice if her goal is to stay home and only manage symptoms.  She agrees to echocardiogram and CT abd/pelvis with further decision pending results.

## 2018-07-16 NOTE — Assessment & Plan Note (Signed)
Daughter notes improvement on SSRI.

## 2018-07-16 NOTE — Progress Notes (Signed)
This visit was conducted in person.  BP 120/82 (BP Location: Left Arm, Patient Position: Sitting, Cuff Size: Normal)   Pulse 79   Temp 98.3 F (36.8 C) (Oral)   Ht 5\' 1"  (1.549 m)   Wt 121 lb (54.9 kg)   SpO2 92%   BMI 22.86 kg/m    CC: ER f/u visit Subjective:    Patient ID: Maria Simon, female    DOB: Jul 18, 1933, 85 y.o.   MRN: [id number]  HPI: Maria Simon is a 85 y.o. female presenting on 07/16/2018 for Hospitalization Follow-up (Seen at [facility] ED on 07/12/18. Accompanied by her daughter, Lynelle Smoke. ) and Medication Refill (Needs 90-day refill for sertraline. )   See recent office note, transfusion notes and ER visit.  Recently found to have new symptomatic anemia (Hgb 12 --> 8.4) s/p blood transfusion of 1u pRBC last week (Hgb up to after transfusion 9.6) complicated by acute dyspnea and hypoxia concern for pulm edema so sent to ER where xray was stable (COPD) but troponin returned elevated at 0.36 and BNP up to 1200s. She declined hospitalization and was discharged on prednisone to treat possible COPD exacerbation. I also asked her to double lasix to 20mg  over weekend - notes some improved dyspnea with this as well.   Known kidney mass found last year - declined further eval at that time.  iFOB positive last week. Has declined further evaluation including GI referral, colonoscopy, CT imaging.  "If cancer found, would not want further evaluation/treatment".   Here with daughter to review treatment options/management plan.   Notes dyspnea worse today, worse with exertion but even present at rest. Notes increased leg swelling for several months as well.  Endorsing increasing diarrhea (very loose with mucous, not watery) over last few months. No fevers, abd pain. No significant weight changes.       Relevant past medical, surgical, family and social history reviewed and updated as indicated. Interim medical history since our last visit reviewed. Allergies and medications  reviewed and updated. Outpatient Medications Prior to Visit  Medication Sig Dispense Refill  . Albuterol Sulfate (PROAIR RESPICLICK) 671 (90 Base) MCG/ACT AEPB Take 2 puffs by mouth every 6 (six) hours as needed (dyspnea). 1 each 3  . aspirin 81 MG tablet Take 81 mg by mouth daily.     Marland Kitchen atorvastatin (LIPITOR) 80 MG tablet Take 1 tablet (80 mg total) by mouth daily. 90 tablet 1  . benzonatate (TESSALON) 100 MG capsule Take 1 capsule (100 mg total) by mouth 3 (three) times daily as needed for cough. 30 capsule 0  . bisacodyl (DULCOLAX) 5 MG EC tablet Take 1 tablet (5 mg total) by mouth daily as needed for moderate constipation. 30 tablet 0  . budesonide (PULMICORT) 0.25 MG/2ML nebulizer solution USE 1 VIAL IN NEBULIZER TWICE DAILY.  DX CODE:  J43.2 120 mL 5  . Calcium Carb-Cholecalciferol (CALCIUM-VITAMIN D) 600-400 MG-UNIT TABS Take 1 tablet by mouth every other day.     . enalapril (VASOTEC) 10 MG tablet Take 1 tablet by mouth once daily 90 tablet 1  . ferrous sulfate 325 (65 FE) MG tablet Take 1 tablet (325 mg total) by mouth daily with breakfast. 90 tablet 1  . levalbuterol (XOPENEX) 1.25 MG/0.5ML nebulizer solution Take 1.25 mg by nebulization every 6 (six) hours as needed for wheezing or shortness of breath. 1 each 1  . metoprolol tartrate (LOPRESSOR) 50 MG tablet Take 1 tablet (50 mg total) by mouth 2 (two) times daily.  180 tablet 1  . Multiple Vitamin (MULTIVITAMIN) tablet Take 1 tablet by mouth daily. Centrum Silver    . predniSONE (DELTASONE) 10 MG tablet Take 2 tablets (20 mg total) by mouth daily. 15 tablet 0  . furosemide (LASIX) 20 MG tablet Take 0.5 tablets (10 mg total) by mouth daily as needed for fluid or edema. 30 tablet 6  . sertraline (ZOLOFT) 25 MG tablet Take 1 tablet (25 mg total) by mouth daily. 30 tablet 6   No facility-administered medications prior to visit.      Per HPI unless specifically indicated in ROS section below Review of Systems Objective:    BP 120/82  (BP Location: Left Arm, Patient Position: Sitting, Cuff Size: Normal)   Pulse 79   Temp 98.3 F (36.8 C) (Oral)   Ht 5\' 1"  (1.549 m)   Wt 121 lb (54.9 kg)   SpO2 92%   BMI 22.86 kg/m   Wt Readings from Last 3 Encounters:  07/16/18 121 lb (54.9 kg)  07/12/18 114 lb (51.7 kg)  01/09/18 117 lb 8 oz (53.3 kg)    Physical Exam Vitals signs and nursing note reviewed.  Constitutional:      Appearance: Normal appearance. She is ill-appearing. She is not toxic-appearing.     Comments: In wheelchair  HENT:     Mouth/Throat:     Mouth: Mucous membranes are moist.     Pharynx: No posterior oropharyngeal erythema.  Eyes:     Comments: Pale palpebral conjunctiva  Cardiovascular:     Rate and Rhythm: Normal rate and regular rhythm.     Pulses: Normal pulses.     Heart sounds: Normal heart sounds. No murmur.  Pulmonary:     Effort: Pulmonary effort is normal. Tachypnea (with ambulation) present. No respiratory distress.     Breath sounds: Normal breath sounds. No decreased breath sounds, wheezing, rhonchi or rales.  Musculoskeletal:     Right lower leg: Edema (1+ pitting) present.     Left lower leg: Edema (1+ pitting ) present.  Skin:    Coloration: Skin is pale.     Findings: No rash.  Neurological:     Mental Status: She is alert.  Psychiatric:        Mood and Affect: Mood normal.        Behavior: Behavior normal.        Marked dyspnea with ambulatory pulse ox but O2 sats maintain at 91-92%.   Results for orders placed or performed during the hospital encounter of 97/67/34  Basic metabolic panel  Result Value Ref Range   Sodium 140 135 - 145 mmol/L   Potassium 3.9 3.5 - 5.1 mmol/L   Chloride 102 98 - 111 mmol/L   CO2 26 22 - 32 mmol/L   Glucose, Bld 136 (H) 70 - 99 mg/dL   BUN 11 8 - 23 mg/dL   Creatinine, Ser 0.81 0.44 - 1.00 mg/dL   Calcium 8.8 (L) 8.9 - 10.3 mg/dL   GFR calc non Af Amer >60 >60 mL/min   GFR calc Af Amer >60 >60 mL/min   Anion gap 12 5 - 15  CBC  with Differential  Result Value Ref Range   WBC 7.6 4.0 - 10.5 K/uL   RBC 5.05 3.87 - 5.11 MIL/uL   Hemoglobin 9.6 (L) 12.0 - 15.0 g/dL   HCT 36.6 36.0 - 46.0 %   MCV 72.5 (L) 80.0 - 100.0 fL   MCH 19.0 (L) 26.0 - 34.0 pg  MCHC 26.2 (L) 30.0 - 36.0 g/dL   RDW 24.4 (H) 11.5 - 15.5 %   Platelets 158 150 - 400 K/uL   nRBC 0.4 (H) 0.0 - 0.2 %   Neutrophils Relative % 80 %   Neutro Abs 6.2 1.7 - 7.7 K/uL   Lymphocytes Relative 11 %   Lymphs Abs 0.8 0.7 - 4.0 K/uL   Monocytes Relative 8 %   Monocytes Absolute 0.6 0.1 - 1.0 K/uL   Eosinophils Relative 1 %   Eosinophils Absolute 0.0 0.0 - 0.5 K/uL   Basophils Relative 0 %   Basophils Absolute 0.0 0.0 - 0.1 K/uL   RBC Morphology BURR CELLS    Immature Granulocytes 0 %   Abs Immature Granulocytes 0.03 0.00 - 0.07 K/uL   Schistocytes PRESENT    Polychromasia PRESENT   Troponin I - Once  Result Value Ref Range   Troponin I 0.36 (HH) <0.03 ng/mL  Brain natriuretic peptide  Result Value Ref Range   B Natriuretic Peptide 1,232.5 (H) 0.0 - 100.0 pg/mL   Assessment & Plan:   Problem List Items Addressed This Visit    Status post thoracic aortic aneurysm repair   Social isolation    Daughter notes improvement on SSRI.       Shortness of breath    Progressive both with exertion and at rest.  Ambulatory pulse ox with O2 sats maintained however noted worsening dyspnea with minimal exertion. Concern for worsening cardiac status ?new systolic CHF exacerbation vs cardiac ischemia worsened by new anemia. Hesitant to rpt blood transfusion given worsened dyspnea after this.       Relevant Orders   ECHOCARDIOGRAM COMPLETE   Occult blood positive stool   Left kidney mass    She had previously declined further eval. Agrees to CT abd/pelvis today.  Recent urinalysis normal       Relevant Orders   CT Abdomen Pelvis W Contrast   History of endograft repair of thoracic aortic aneurysm (2005)   Relevant Orders   ECHOCARDIOGRAM COMPLETE    Goals of care, counseling/discussion    Pt remains hesitant for aggressive evaluation or management.  Reviewed evaluation options for dyspnea, anemia, GI bleed.  Reviewed option of palliative care and hospice if her goal is to stay home and only manage symptoms.  She agrees to echocardiogram and CT abd/pelvis with further decision pending results.       Emphysema of lung (HCC)    Completing prednisone burst by ER. Ongoing dyspnea as per below.       DNR (do not resuscitate)   CHF (congestive heart failure) ([place])    H/o diastolic dysfunction by [place] 08/2016 - concern with worsening CHF due to progressive exertional dyspnea and at rest. See below. Update heart ultrasound. She declined further evaluation at ER and left AMA.      Relevant Medications   furosemide (LASIX) 20 MG tablet   Other Relevant Orders   ECHOCARDIOGRAM COMPLETE   CAD (coronary artery disease)    H/o this, concern for acute worsening in setting of new anemia, elevated BNP and TnI during recent ER eval. She declined further evaluation in ER/cardiology referral today but agrees to echocardiogram.       Relevant Medications   furosemide (LASIX) 20 MG tablet   Anemia - Primary    Unclear cause as of yet. Normal iron stores and panel prior to transfusion suggest more acute blood loss process. With pos iFOB anticipate GI bleed as cause. Discussed possible causes  ranging from benign treatable AVM to metastatic cancer, reviewed evaluation options from imaging to colonoscopy to GI eval. She does not want invasive evaluation or significant treatment but agrees to proceed with CT abd/pelvis.  OTC iron started last week. Aspirin remains on hold.       Relevant Orders   CT Abdomen Pelvis W Contrast       Meds ordered this encounter  Medications  . DISCONTD: sertraline (ZOLOFT) 25 MG tablet    Sig: Take 1 tablet (25 mg total) by mouth daily.    Dispense:  30 tablet    Refill:  6  . furosemide (LASIX) 20 MG tablet    Sig:  Take 1 tablet (20 mg total) by mouth daily as needed for fluid or edema.    Dispense:  90 tablet    Refill:  1  . sertraline (ZOLOFT) 25 MG tablet    Sig: Take 1 tablet (25 mg total) by mouth daily.    Dispense:  90 tablet    Refill:  1   Orders Placed This Encounter  Procedures  . CT Abdomen Pelvis W Contrast    Check w ECHO to see if pt can have ECHO at [address] [place] [phone]/ MCR/ not diabetic/BUN 11 CRE 0.81 No Cough No Fever No Travel  No Contact      Standing Status:   Future    Standing Expiration Date:   10/16/2019    Order Specific Question:   ** REASON FOR EXAM (FREE TEXT)    Answer:   h/o kidney mass, GI bleed with symptomatic anemia    Order Specific Question:   If indicated for the ordered procedure, I authorize the administration of contrast media per Radiology protocol    Answer:   Yes    Order Specific Question:   Preferred imaging location?    Answer:   [facility]    Order Specific Question:   Is Oral Contrast requested for this exam?    Answer:   Yes, Per Radiology protocol    Order Specific Question:   Radiology Contrast Protocol - do NOT remove file path    Answer:   [email or web address]  . ECHOCARDIOGRAM COMPLETE    Standing Status:   Future    Standing Expiration Date:   10/16/2019    Order Specific Question:   Where should this test be performed    Answer:   [facility])    Order Specific Question:   Does the patient weigh less than or greater than 250 lbs?    Answer:   Patient weighs less than 250 lbs    Order Specific Question:   Perflutren DEFINITY (image enhancing agent) should be administered unless hypersensitivity or allergy exist    Answer:   Administer Perflutren    Order Specific Question:   Reason for exam-Echo    Answer:   Dyspnea  786.09 / R06.00    Patient Instructions  Increase lasix to 20mg  daily for now to help leg swelling.  See Rosaria Ferries to set up heart ultrasound and CT  abd/pelvis.  We will be in touch with results and plan after this.     Follow up plan: No follow-ups on file.  Ria Bush, MD

## 2018-07-16 NOTE — Assessment & Plan Note (Signed)
Progressive both with exertion and at rest.  Ambulatory pulse ox with O2 sats maintained however noted worsening dyspnea with minimal exertion. Concern for worsening cardiac status ?new systolic CHF exacerbation vs cardiac ischemia worsened by new anemia. Hesitant to rpt blood transfusion given worsened dyspnea after this.

## 2018-07-16 NOTE — Patient Instructions (Addendum)
Increase lasix to 20mg  daily for now to help leg swelling.  See Rosaria Ferries to set up heart ultrasound and CT abd/pelvis.  We will be in touch with results and plan after this.

## 2018-07-16 NOTE — Assessment & Plan Note (Signed)
She had previously declined further eval. Agrees to CT abd/pelvis today.  Recent urinalysis normal

## 2018-07-16 NOTE — Assessment & Plan Note (Signed)
H/o this, concern for acute worsening in setting of new anemia, elevated BNP and TnI during recent ER eval. She declined further evaluation in ER/cardiology referral today but agrees to echocardiogram.

## 2018-07-16 NOTE — Assessment & Plan Note (Signed)
Unclear cause as of yet. Normal iron stores and panel prior to transfusion suggest more acute blood loss process. With pos iFOB anticipate GI bleed as cause. Discussed possible causes ranging from benign treatable AVM to metastatic cancer, reviewed evaluation options from imaging to colonoscopy to GI eval. She does not want invasive evaluation or significant treatment but agrees to proceed with CT abd/pelvis.  OTC iron started last week. Aspirin remains on hold.

## 2018-07-17 ENCOUNTER — Ambulatory Visit (INDEPENDENT_AMBULATORY_CARE_PROVIDER_SITE_OTHER)
Admission: RE | Admit: 2018-07-17 | Discharge: 2018-07-17 | Disposition: A | Discharge status: Home / Self Care | Payer: Medicare Other | Source: Ambulatory Visit | Attending: Family Medicine | Admitting: Family Medicine

## 2018-07-17 ENCOUNTER — Ambulatory Visit (HOSPITAL_COMMUNITY): Payer: Medicare Other | Attending: Cardiovascular Disease

## 2018-07-17 DIAGNOSIS — D649 Anemia, unspecified: Secondary | ICD-10-CM | POA: Diagnosis not present

## 2018-07-17 DIAGNOSIS — Z9889 Other specified postprocedural states: Secondary | ICD-10-CM | POA: Diagnosis not present

## 2018-07-17 DIAGNOSIS — N2889 Other specified disorders of kidney and ureter: Secondary | ICD-10-CM

## 2018-07-17 DIAGNOSIS — Z8679 Personal history of other diseases of the circulatory system: Secondary | ICD-10-CM | POA: Insufficient documentation

## 2018-07-17 DIAGNOSIS — R0602 Shortness of breath: Secondary | ICD-10-CM | POA: Insufficient documentation

## 2018-07-17 DIAGNOSIS — I5032 Chronic diastolic (congestive) heart failure: Secondary | ICD-10-CM | POA: Insufficient documentation

## 2018-07-17 DIAGNOSIS — I714 Abdominal aortic aneurysm, without rupture: Secondary | ICD-10-CM | POA: Diagnosis not present

## 2018-07-17 MED ORDER — IOHEXOL 300 MG/ML  SOLN
100.0000 mL | Freq: Once | INTRAMUSCULAR | Status: AC | PRN
  Administered 2018-07-17: 100 mL via INTRAVENOUS

## 2018-07-18 ENCOUNTER — Telehealth: Payer: Self-pay

## 2018-07-18 DIAGNOSIS — K6289 Other specified diseases of anus and rectum: Secondary | ICD-10-CM

## 2018-07-18 DIAGNOSIS — R195 Other fecal abnormalities: Secondary | ICD-10-CM

## 2018-07-18 DIAGNOSIS — R160 Hepatomegaly, not elsewhere classified: Secondary | ICD-10-CM

## 2018-07-18 DIAGNOSIS — R0602 Shortness of breath: Secondary | ICD-10-CM

## 2018-07-18 DIAGNOSIS — Z66 Do not resuscitate: Secondary | ICD-10-CM

## 2018-07-18 DIAGNOSIS — Z8679 Personal history of other diseases of the circulatory system: Secondary | ICD-10-CM

## 2018-07-18 DIAGNOSIS — I5032 Chronic diastolic (congestive) heart failure: Secondary | ICD-10-CM

## 2018-07-18 DIAGNOSIS — I272 Pulmonary hypertension, unspecified: Secondary | ICD-10-CM

## 2018-07-18 NOTE — Telephone Encounter (Signed)
Noted. Will call patient.

## 2018-07-18 NOTE — Telephone Encounter (Signed)
Urgent Hospice Referral faxed to [facility] office,daughter and patient notified to expect a call.

## 2018-07-18 NOTE — Telephone Encounter (Addendum)
Spent 30 min on phone with pt/daughter reviewing yesterday's CT abd/pelvis and echocardiogram. Discussed all findings and implications in detail.  Pt does not want further evaluation, aggressive intervention, treatment, or referral to thoracic surgery or oncology or GI.  Agrees to eval by palliative care and hospice. I reviewed difference between the two.  Ongoing dyspnea at rest - rec increase lasix to 40mg  daily x 3 days then call us with update. No further blood in stool.

## 2018-07-18 NOTE — Telephone Encounter (Signed)
Maria Simon with [facility] radiology called report CT Abd & pelvis w contrast; report taken to Dr Darnell Level and in [place]. Pt is not waiting.

## 2018-07-18 NOTE — Addendum Note (Signed)
Addended by: Ria Bush on: 07/18/2018 01:55 PM   Modules accepted: Orders

## 2018-07-19 DIAGNOSIS — D630 Anemia in neoplastic disease: Secondary | ICD-10-CM | POA: Diagnosis not present

## 2018-07-19 DIAGNOSIS — Z8679 Personal history of other diseases of the circulatory system: Secondary | ICD-10-CM | POA: Diagnosis not present

## 2018-07-19 DIAGNOSIS — I5030 Unspecified diastolic (congestive) heart failure: Secondary | ICD-10-CM | POA: Diagnosis not present

## 2018-07-19 DIAGNOSIS — M1990 Unspecified osteoarthritis, unspecified site: Secondary | ICD-10-CM | POA: Diagnosis not present

## 2018-07-19 DIAGNOSIS — I110 Hypertensive heart disease with heart failure: Secondary | ICD-10-CM | POA: Diagnosis not present

## 2018-07-19 DIAGNOSIS — E785 Hyperlipidemia, unspecified: Secondary | ICD-10-CM | POA: Diagnosis not present

## 2018-07-19 DIAGNOSIS — K921 Melena: Secondary | ICD-10-CM | POA: Diagnosis not present

## 2018-07-19 DIAGNOSIS — C642 Malignant neoplasm of left kidney, except renal pelvis: Secondary | ICD-10-CM | POA: Diagnosis not present

## 2018-07-19 DIAGNOSIS — Z9981 Dependence on supplemental oxygen: Secondary | ICD-10-CM | POA: Diagnosis not present

## 2018-07-19 DIAGNOSIS — J449 Chronic obstructive pulmonary disease, unspecified: Secondary | ICD-10-CM | POA: Diagnosis not present

## 2018-07-19 DIAGNOSIS — Z87891 Personal history of nicotine dependence: Secondary | ICD-10-CM | POA: Diagnosis not present

## 2018-07-19 DIAGNOSIS — C785 Secondary malignant neoplasm of large intestine and rectum: Secondary | ICD-10-CM | POA: Diagnosis not present

## 2018-07-19 DIAGNOSIS — C787 Secondary malignant neoplasm of liver and intrahepatic bile duct: Secondary | ICD-10-CM | POA: Diagnosis not present

## 2018-07-19 DIAGNOSIS — I272 Pulmonary hypertension, unspecified: Secondary | ICD-10-CM | POA: Diagnosis not present

## 2018-07-19 DIAGNOSIS — I2510 Atherosclerotic heart disease of native coronary artery without angina pectoris: Secondary | ICD-10-CM | POA: Diagnosis not present

## 2018-07-19 DIAGNOSIS — J9610 Chronic respiratory failure, unspecified whether with hypoxia or hypercapnia: Secondary | ICD-10-CM | POA: Diagnosis not present

## 2018-07-22 DIAGNOSIS — I5030 Unspecified diastolic (congestive) heart failure: Secondary | ICD-10-CM | POA: Diagnosis not present

## 2018-07-22 DIAGNOSIS — C642 Malignant neoplasm of left kidney, except renal pelvis: Secondary | ICD-10-CM | POA: Diagnosis not present

## 2018-07-22 DIAGNOSIS — C785 Secondary malignant neoplasm of large intestine and rectum: Secondary | ICD-10-CM | POA: Diagnosis not present

## 2018-07-22 DIAGNOSIS — I110 Hypertensive heart disease with heart failure: Secondary | ICD-10-CM | POA: Diagnosis not present

## 2018-07-22 DIAGNOSIS — E785 Hyperlipidemia, unspecified: Secondary | ICD-10-CM | POA: Diagnosis not present

## 2018-07-22 DIAGNOSIS — C787 Secondary malignant neoplasm of liver and intrahepatic bile duct: Secondary | ICD-10-CM | POA: Diagnosis not present

## 2018-07-22 NOTE — Telephone Encounter (Signed)
Spoke with pt and pt's daughter, Lynelle Smoke (on dpr), relaying Dr. Synthia Innocent instructions. Verbalize understanding.

## 2018-07-22 NOTE — Telephone Encounter (Addendum)
Thank you. Recommend go back to lasix 20mg  daily every day, may increase to 40mg  for 3 days if leg swelling again worsens. Let us know if shortness of breath again worsens.

## 2018-07-22 NOTE — Telephone Encounter (Signed)
I spoke with pt since Maria Simon not available;pt did increase laix to 40 mg daily over weekend & feet and legs are not as swollen as on 07/19/18 but legs and feet are swollen a little bit today; pt does try to elevate feet while sitting. Pt is not having any extra problems breathing; same as usual. Pt request cb to let her know how to take Lasix.Please advise.

## 2018-07-23 DIAGNOSIS — C785 Secondary malignant neoplasm of large intestine and rectum: Secondary | ICD-10-CM | POA: Diagnosis not present

## 2018-07-23 DIAGNOSIS — E785 Hyperlipidemia, unspecified: Secondary | ICD-10-CM | POA: Diagnosis not present

## 2018-07-23 DIAGNOSIS — I110 Hypertensive heart disease with heart failure: Secondary | ICD-10-CM | POA: Diagnosis not present

## 2018-07-23 DIAGNOSIS — C787 Secondary malignant neoplasm of liver and intrahepatic bile duct: Secondary | ICD-10-CM | POA: Diagnosis not present

## 2018-07-23 DIAGNOSIS — I5030 Unspecified diastolic (congestive) heart failure: Secondary | ICD-10-CM | POA: Diagnosis not present

## 2018-07-23 DIAGNOSIS — C642 Malignant neoplasm of left kidney, except renal pelvis: Secondary | ICD-10-CM | POA: Diagnosis not present

## 2018-07-29 ENCOUNTER — Telehealth: Payer: Self-pay | Admitting: Internal Medicine

## 2018-07-29 DIAGNOSIS — I110 Hypertensive heart disease with heart failure: Secondary | ICD-10-CM | POA: Diagnosis not present

## 2018-07-29 DIAGNOSIS — C785 Secondary malignant neoplasm of large intestine and rectum: Secondary | ICD-10-CM | POA: Diagnosis not present

## 2018-07-29 DIAGNOSIS — C642 Malignant neoplasm of left kidney, except renal pelvis: Secondary | ICD-10-CM | POA: Diagnosis not present

## 2018-07-29 DIAGNOSIS — E785 Hyperlipidemia, unspecified: Secondary | ICD-10-CM | POA: Diagnosis not present

## 2018-07-29 DIAGNOSIS — I5030 Unspecified diastolic (congestive) heart failure: Secondary | ICD-10-CM | POA: Diagnosis not present

## 2018-07-29 DIAGNOSIS — C787 Secondary malignant neoplasm of liver and intrahepatic bile duct: Secondary | ICD-10-CM | POA: Diagnosis not present

## 2018-07-29 MED ORDER — ALIGN PO CAPS: 1.0000 | ORAL_CAPSULE | Freq: Every day | ORAL | 0 refills | Status: AC

## 2018-07-29 MED ORDER — CEPHALEXIN 500 MG PO CAPS: 500.0000 mg | ORAL_CAPSULE | Freq: Two times a day (BID) | ORAL | 0 refills | Status: AC

## 2018-07-29 NOTE — Telephone Encounter (Signed)
Received a call from Hospice nurse Vivien Rota).  Reports pts left leg is red and she is concerned she has developed cellulitis.  Reviewed chart.  Has had issues with lower extremity swelling.  Was admitted to hospice 07/19/18.  Desires no further testing or evaluation per note.  Order given and prescription sent in for keflex.  Also started her on a probiotic daily while on the antibiotics and to continue for two weeks after completing the antibiotic.  Vivien Rota will have nurse recheck her tomorrow and call with update.

## 2018-07-29 NOTE — Telephone Encounter (Signed)
Noted. Thank you. Will await tomorrow's update.

## 2018-07-30 ENCOUNTER — Telehealth: Payer: Self-pay

## 2018-07-30 NOTE — Telephone Encounter (Signed)
[address] Night - Client Nonclinical Telephone Record [place] Night - Client Client Site [place] Physician Ria Bush - MD Contact Type Call Call [place] Now Who [place] Name [facility] Name [place] Number [phone] Patient Name Maria Simon Patient DOB Nov 11, 1933 Reason for Call Request to speak to Physician Initial Comment Caller states that she is with [facility] and she needs to speak with Dr. Ria Bush regarding a medication order. Additional Comment Paging DoctorName Phone DateTime Result/Outcome Message Type Notes Einar Pheasant - MD [phone] 07/29/2018 5:14:30 PM Called On Call Provider - Reached Doctor Paged Einar Pheasant - MD 07/29/2018 5:14:47 PM Spoke with On Call - General Message Result Call Closed By: Renato Shin Transaction Date/Time: 07/29/2018 5:00:04 PM (ET)

## 2018-07-30 NOTE — Telephone Encounter (Signed)
Please refer to phone encounter by on call provider from yesterday 07/29/18.  PCP was copied and is aware of communication with hospice.

## 2018-07-30 NOTE — Telephone Encounter (Signed)
Plz ask hospice Rx about any unilateral leg swelling.  Ok to do CBC if desired by pt/family to monitor anemia.

## 2018-07-30 NOTE — Addendum Note (Signed)
Addended by: Ria Bush on: 07/30/2018 08:20 AM   Modules accepted: Level of Service

## 2018-08-02 NOTE — Telephone Encounter (Signed)
LM on Toni's VM to follow up on patient's condition and with Dr. Synthia Innocent orders (if family approval) from 07/30/18.   Will await for follow up call on Monday.

## 2018-08-05 DIAGNOSIS — I2510 Atherosclerotic heart disease of native coronary artery without angina pectoris: Secondary | ICD-10-CM | POA: Diagnosis not present

## 2018-08-05 DIAGNOSIS — J9610 Chronic respiratory failure, unspecified whether with hypoxia or hypercapnia: Secondary | ICD-10-CM | POA: Diagnosis not present

## 2018-08-05 DIAGNOSIS — Z9981 Dependence on supplemental oxygen: Secondary | ICD-10-CM | POA: Diagnosis not present

## 2018-08-05 DIAGNOSIS — I272 Pulmonary hypertension, unspecified: Secondary | ICD-10-CM | POA: Diagnosis not present

## 2018-08-05 DIAGNOSIS — C785 Secondary malignant neoplasm of large intestine and rectum: Secondary | ICD-10-CM | POA: Diagnosis not present

## 2018-08-05 DIAGNOSIS — D630 Anemia in neoplastic disease: Secondary | ICD-10-CM | POA: Diagnosis not present

## 2018-08-05 DIAGNOSIS — I110 Hypertensive heart disease with heart failure: Secondary | ICD-10-CM | POA: Diagnosis not present

## 2018-08-05 DIAGNOSIS — C642 Malignant neoplasm of left kidney, except renal pelvis: Secondary | ICD-10-CM | POA: Diagnosis not present

## 2018-08-05 DIAGNOSIS — E785 Hyperlipidemia, unspecified: Secondary | ICD-10-CM | POA: Diagnosis not present

## 2018-08-05 DIAGNOSIS — M1990 Unspecified osteoarthritis, unspecified site: Secondary | ICD-10-CM | POA: Diagnosis not present

## 2018-08-05 DIAGNOSIS — J449 Chronic obstructive pulmonary disease, unspecified: Secondary | ICD-10-CM | POA: Diagnosis not present

## 2018-08-05 DIAGNOSIS — Z8679 Personal history of other diseases of the circulatory system: Secondary | ICD-10-CM | POA: Diagnosis not present

## 2018-08-05 DIAGNOSIS — Z87891 Personal history of nicotine dependence: Secondary | ICD-10-CM | POA: Diagnosis not present

## 2018-08-05 DIAGNOSIS — I5030 Unspecified diastolic (congestive) heart failure: Secondary | ICD-10-CM | POA: Diagnosis not present

## 2018-08-05 DIAGNOSIS — K921 Melena: Secondary | ICD-10-CM | POA: Diagnosis not present

## 2018-08-05 DIAGNOSIS — C787 Secondary malignant neoplasm of liver and intrahepatic bile duct: Secondary | ICD-10-CM | POA: Diagnosis not present

## 2018-08-05 NOTE — Telephone Encounter (Signed)
Mitzy calls to update on patient condition following her visit today.  1.  Patient has had a prior bad experience with blood transfusions and is not interested in treatment and also not interested in any further lab draws at this time to monitor her anemia.   2.  Patients leg is improving on the antibiotic and hospice is providing wrap supports for the swelling.  Nurse to continue to monitor and will keep Dr. Darnell Level updated as to condition.   3.  Due to patient's insurance coverage, hospice has had to change patient's medications:  - burdesonide has been d/c'ed and instead they are using prednisone 10mg  daily and plain albuterol via neb treatment in its' place. (Please clarify the strength of neb treatment and update med list as appropriate when speaking back with hospice nurse).   Mitzy would like a call back after Dr. Darnell Level has reviewed these findings with his response.

## 2018-08-05 NOTE — Telephone Encounter (Signed)
Noted.  Fyi to Dr. G.  

## 2018-08-05 NOTE — Addendum Note (Signed)
Addended by: Magdalen Spatz C on: 08/05/2018 04:43 PM   Modules accepted: Orders

## 2018-08-05 NOTE — Telephone Encounter (Signed)
Noted  

## 2018-08-05 NOTE — Telephone Encounter (Signed)
Mitzy (Nurse with Hospice) called to follow up on patient.    She is going out to the house today for a visit and will re-evaluate her legs and discuss with the family regarding whether to monitor CBC or not at this point.   She will call back with an update.

## 2018-08-06 NOTE — Telephone Encounter (Signed)
Noted. I'm glad keflex is helping leg.  Is it much more swollen compared to other side?  Ok to continue prednisone 10mg  in place of budesonide at this time if it is helping breathing. Continue albuterol neb PRN.

## 2018-08-06 NOTE — Telephone Encounter (Signed)
Spoke with Maria Simon relaying Dr. Synthia Innocent message.  Verbalizes understanding.  States is 3+ on left leg and 1+ on right. They are wrapping legs daily.  Maria Simon wants to make Dr. Darnell Level aware that pt declines blood products at this time.  Also, pt's O2 sat is 96% at rest on 1.5 L and 88% at rest w/o O2. Pt is pretty much on O2 all the time.

## 2018-08-06 NOTE — Addendum Note (Signed)
Addended by: Ria Bush on: 08/06/2018 07:46 AM   Modules accepted: Orders

## 2018-08-19 DIAGNOSIS — I110 Hypertensive heart disease with heart failure: Secondary | ICD-10-CM | POA: Diagnosis not present

## 2018-08-19 DIAGNOSIS — C787 Secondary malignant neoplasm of liver and intrahepatic bile duct: Secondary | ICD-10-CM | POA: Diagnosis not present

## 2018-08-19 DIAGNOSIS — C642 Malignant neoplasm of left kidney, except renal pelvis: Secondary | ICD-10-CM | POA: Diagnosis not present

## 2018-08-19 DIAGNOSIS — E785 Hyperlipidemia, unspecified: Secondary | ICD-10-CM | POA: Diagnosis not present

## 2018-08-19 DIAGNOSIS — I5030 Unspecified diastolic (congestive) heart failure: Secondary | ICD-10-CM | POA: Diagnosis not present

## 2018-08-19 DIAGNOSIS — C785 Secondary malignant neoplasm of large intestine and rectum: Secondary | ICD-10-CM | POA: Diagnosis not present

## 2018-08-20 DIAGNOSIS — E785 Hyperlipidemia, unspecified: Secondary | ICD-10-CM | POA: Diagnosis not present

## 2018-08-20 DIAGNOSIS — I5030 Unspecified diastolic (congestive) heart failure: Secondary | ICD-10-CM | POA: Diagnosis not present

## 2018-08-20 DIAGNOSIS — C642 Malignant neoplasm of left kidney, except renal pelvis: Secondary | ICD-10-CM | POA: Diagnosis not present

## 2018-08-20 DIAGNOSIS — I110 Hypertensive heart disease with heart failure: Secondary | ICD-10-CM | POA: Diagnosis not present

## 2018-08-20 DIAGNOSIS — C787 Secondary malignant neoplasm of liver and intrahepatic bile duct: Secondary | ICD-10-CM | POA: Diagnosis not present

## 2018-08-20 DIAGNOSIS — C785 Secondary malignant neoplasm of large intestine and rectum: Secondary | ICD-10-CM | POA: Diagnosis not present

## 2018-09-04 ENCOUNTER — Telehealth: Payer: Self-pay

## 2018-09-04 DIAGNOSIS — I272 Pulmonary hypertension, unspecified: Secondary | ICD-10-CM | POA: Diagnosis not present

## 2018-09-04 DIAGNOSIS — J9610 Chronic respiratory failure, unspecified whether with hypoxia or hypercapnia: Secondary | ICD-10-CM | POA: Diagnosis not present

## 2018-09-04 DIAGNOSIS — J449 Chronic obstructive pulmonary disease, unspecified: Secondary | ICD-10-CM | POA: Diagnosis not present

## 2018-09-04 DIAGNOSIS — I2510 Atherosclerotic heart disease of native coronary artery without angina pectoris: Secondary | ICD-10-CM | POA: Diagnosis not present

## 2018-09-04 DIAGNOSIS — E785 Hyperlipidemia, unspecified: Secondary | ICD-10-CM | POA: Diagnosis not present

## 2018-09-04 DIAGNOSIS — C785 Secondary malignant neoplasm of large intestine and rectum: Secondary | ICD-10-CM | POA: Diagnosis not present

## 2018-09-04 DIAGNOSIS — I5030 Unspecified diastolic (congestive) heart failure: Secondary | ICD-10-CM | POA: Diagnosis not present

## 2018-09-04 DIAGNOSIS — Z9981 Dependence on supplemental oxygen: Secondary | ICD-10-CM | POA: Diagnosis not present

## 2018-09-04 DIAGNOSIS — C642 Malignant neoplasm of left kidney, except renal pelvis: Secondary | ICD-10-CM | POA: Diagnosis not present

## 2018-09-04 DIAGNOSIS — I110 Hypertensive heart disease with heart failure: Secondary | ICD-10-CM | POA: Diagnosis not present

## 2018-09-04 DIAGNOSIS — Z87891 Personal history of nicotine dependence: Secondary | ICD-10-CM | POA: Diagnosis not present

## 2018-09-04 DIAGNOSIS — K921 Melena: Secondary | ICD-10-CM | POA: Diagnosis not present

## 2018-09-04 DIAGNOSIS — C787 Secondary malignant neoplasm of liver and intrahepatic bile duct: Secondary | ICD-10-CM | POA: Diagnosis not present

## 2018-09-04 DIAGNOSIS — Z8679 Personal history of other diseases of the circulatory system: Secondary | ICD-10-CM | POA: Diagnosis not present

## 2018-09-04 DIAGNOSIS — M1990 Unspecified osteoarthritis, unspecified site: Secondary | ICD-10-CM | POA: Diagnosis not present

## 2018-09-04 DIAGNOSIS — D630 Anemia in neoplastic disease: Secondary | ICD-10-CM | POA: Diagnosis not present

## 2018-09-04 NOTE — Telephone Encounter (Signed)
Spoke with Mitzi relaying Dr. Synthia Innocent instructions.  Mitzi verbalizes understanding.  Says HR was good at 78.  Says chol med was stopped.  And educated pt about fiber foods to help with BMs.

## 2018-09-04 NOTE — Telephone Encounter (Signed)
Mitzi nurse with [facility] left v/m; pts BP has been running lower and today BP 98/50. Mitzi said pt is taking furosemide 20 mg one daily prn Metoprolol tartrate 50 mg taking one bid Enalapril 10 mg taking one daily Mitzi want to know if could back off of one of these meds if OK by Dr Darnell Level. Please advise.

## 2018-09-04 NOTE — Telephone Encounter (Signed)
How is HR? Let's decrease metoprolol to 25mg  (1/2 tab) BID.  Let's decrease lasix to 20mg  daily PRN leg swelling. If BP remaining <177 systolic, decrease enalapril to 5mg  (1/2 tab) daily.

## 2018-09-06 NOTE — Progress Notes (Signed)
I reviewed health advisor's note, was available for consultation, and agree with documentation and plan.  

## 2018-09-09 DIAGNOSIS — E785 Hyperlipidemia, unspecified: Secondary | ICD-10-CM | POA: Diagnosis not present

## 2018-09-09 DIAGNOSIS — C642 Malignant neoplasm of left kidney, except renal pelvis: Secondary | ICD-10-CM | POA: Diagnosis not present

## 2018-09-09 DIAGNOSIS — I5030 Unspecified diastolic (congestive) heart failure: Secondary | ICD-10-CM | POA: Diagnosis not present

## 2018-09-09 DIAGNOSIS — C787 Secondary malignant neoplasm of liver and intrahepatic bile duct: Secondary | ICD-10-CM | POA: Diagnosis not present

## 2018-09-09 DIAGNOSIS — I110 Hypertensive heart disease with heart failure: Secondary | ICD-10-CM | POA: Diagnosis not present

## 2018-09-09 DIAGNOSIS — C785 Secondary malignant neoplasm of large intestine and rectum: Secondary | ICD-10-CM | POA: Diagnosis not present

## 2018-09-10 DIAGNOSIS — C785 Secondary malignant neoplasm of large intestine and rectum: Secondary | ICD-10-CM | POA: Diagnosis not present

## 2018-09-10 DIAGNOSIS — C642 Malignant neoplasm of left kidney, except renal pelvis: Secondary | ICD-10-CM | POA: Diagnosis not present

## 2018-09-10 DIAGNOSIS — I5030 Unspecified diastolic (congestive) heart failure: Secondary | ICD-10-CM | POA: Diagnosis not present

## 2018-09-10 DIAGNOSIS — C787 Secondary malignant neoplasm of liver and intrahepatic bile duct: Secondary | ICD-10-CM | POA: Diagnosis not present

## 2018-09-10 DIAGNOSIS — I110 Hypertensive heart disease with heart failure: Secondary | ICD-10-CM | POA: Diagnosis not present

## 2018-09-10 DIAGNOSIS — E785 Hyperlipidemia, unspecified: Secondary | ICD-10-CM | POA: Diagnosis not present

## 2018-09-13 DIAGNOSIS — C785 Secondary malignant neoplasm of large intestine and rectum: Secondary | ICD-10-CM | POA: Diagnosis not present

## 2018-09-13 DIAGNOSIS — C642 Malignant neoplasm of left kidney, except renal pelvis: Secondary | ICD-10-CM | POA: Diagnosis not present

## 2018-09-13 DIAGNOSIS — I110 Hypertensive heart disease with heart failure: Secondary | ICD-10-CM | POA: Diagnosis not present

## 2018-09-13 DIAGNOSIS — C787 Secondary malignant neoplasm of liver and intrahepatic bile duct: Secondary | ICD-10-CM | POA: Diagnosis not present

## 2018-09-13 DIAGNOSIS — E785 Hyperlipidemia, unspecified: Secondary | ICD-10-CM | POA: Diagnosis not present

## 2018-09-13 DIAGNOSIS — I5030 Unspecified diastolic (congestive) heart failure: Secondary | ICD-10-CM | POA: Diagnosis not present

## 2018-09-17 DIAGNOSIS — C642 Malignant neoplasm of left kidney, except renal pelvis: Secondary | ICD-10-CM | POA: Diagnosis not present

## 2018-09-17 DIAGNOSIS — I110 Hypertensive heart disease with heart failure: Secondary | ICD-10-CM | POA: Diagnosis not present

## 2018-09-17 DIAGNOSIS — I5030 Unspecified diastolic (congestive) heart failure: Secondary | ICD-10-CM | POA: Diagnosis not present

## 2018-09-17 DIAGNOSIS — E785 Hyperlipidemia, unspecified: Secondary | ICD-10-CM | POA: Diagnosis not present

## 2018-09-17 DIAGNOSIS — C787 Secondary malignant neoplasm of liver and intrahepatic bile duct: Secondary | ICD-10-CM | POA: Diagnosis not present

## 2018-09-17 DIAGNOSIS — C785 Secondary malignant neoplasm of large intestine and rectum: Secondary | ICD-10-CM | POA: Diagnosis not present

## 2018-09-24 DIAGNOSIS — C787 Secondary malignant neoplasm of liver and intrahepatic bile duct: Secondary | ICD-10-CM | POA: Diagnosis not present

## 2018-09-24 DIAGNOSIS — C785 Secondary malignant neoplasm of large intestine and rectum: Secondary | ICD-10-CM | POA: Diagnosis not present

## 2018-09-24 DIAGNOSIS — I110 Hypertensive heart disease with heart failure: Secondary | ICD-10-CM | POA: Diagnosis not present

## 2018-09-24 DIAGNOSIS — E785 Hyperlipidemia, unspecified: Secondary | ICD-10-CM | POA: Diagnosis not present

## 2018-09-24 DIAGNOSIS — C642 Malignant neoplasm of left kidney, except renal pelvis: Secondary | ICD-10-CM | POA: Diagnosis not present

## 2018-09-24 DIAGNOSIS — I5030 Unspecified diastolic (congestive) heart failure: Secondary | ICD-10-CM | POA: Diagnosis not present

## 2018-10-02 DIAGNOSIS — C787 Secondary malignant neoplasm of liver and intrahepatic bile duct: Secondary | ICD-10-CM | POA: Diagnosis not present

## 2018-10-02 DIAGNOSIS — E785 Hyperlipidemia, unspecified: Secondary | ICD-10-CM | POA: Diagnosis not present

## 2018-10-02 DIAGNOSIS — I5030 Unspecified diastolic (congestive) heart failure: Secondary | ICD-10-CM | POA: Diagnosis not present

## 2018-10-02 DIAGNOSIS — C642 Malignant neoplasm of left kidney, except renal pelvis: Secondary | ICD-10-CM | POA: Diagnosis not present

## 2018-10-02 DIAGNOSIS — C785 Secondary malignant neoplasm of large intestine and rectum: Secondary | ICD-10-CM | POA: Diagnosis not present

## 2018-10-02 DIAGNOSIS — I110 Hypertensive heart disease with heart failure: Secondary | ICD-10-CM | POA: Diagnosis not present

## 2018-10-03 DIAGNOSIS — C642 Malignant neoplasm of left kidney, except renal pelvis: Secondary | ICD-10-CM | POA: Diagnosis not present

## 2018-10-03 DIAGNOSIS — E785 Hyperlipidemia, unspecified: Secondary | ICD-10-CM | POA: Diagnosis not present

## 2018-10-03 DIAGNOSIS — C787 Secondary malignant neoplasm of liver and intrahepatic bile duct: Secondary | ICD-10-CM | POA: Diagnosis not present

## 2018-10-03 DIAGNOSIS — I5030 Unspecified diastolic (congestive) heart failure: Secondary | ICD-10-CM | POA: Diagnosis not present

## 2018-10-03 DIAGNOSIS — C785 Secondary malignant neoplasm of large intestine and rectum: Secondary | ICD-10-CM | POA: Diagnosis not present

## 2018-10-03 DIAGNOSIS — I110 Hypertensive heart disease with heart failure: Secondary | ICD-10-CM | POA: Diagnosis not present

## 2018-10-04 ENCOUNTER — Other Ambulatory Visit: Payer: Self-pay

## 2018-10-05 DIAGNOSIS — J9610 Chronic respiratory failure, unspecified whether with hypoxia or hypercapnia: Secondary | ICD-10-CM | POA: Diagnosis not present

## 2018-10-05 DIAGNOSIS — D630 Anemia in neoplastic disease: Secondary | ICD-10-CM | POA: Diagnosis not present

## 2018-10-05 DIAGNOSIS — I2510 Atherosclerotic heart disease of native coronary artery without angina pectoris: Secondary | ICD-10-CM | POA: Diagnosis not present

## 2018-10-05 DIAGNOSIS — M1990 Unspecified osteoarthritis, unspecified site: Secondary | ICD-10-CM | POA: Diagnosis not present

## 2018-10-05 DIAGNOSIS — I110 Hypertensive heart disease with heart failure: Secondary | ICD-10-CM | POA: Diagnosis not present

## 2018-10-05 DIAGNOSIS — Z9981 Dependence on supplemental oxygen: Secondary | ICD-10-CM | POA: Diagnosis not present

## 2018-10-05 DIAGNOSIS — K921 Melena: Secondary | ICD-10-CM | POA: Diagnosis not present

## 2018-10-05 DIAGNOSIS — E785 Hyperlipidemia, unspecified: Secondary | ICD-10-CM | POA: Diagnosis not present

## 2018-10-05 DIAGNOSIS — I5030 Unspecified diastolic (congestive) heart failure: Secondary | ICD-10-CM | POA: Diagnosis not present

## 2018-10-05 DIAGNOSIS — C785 Secondary malignant neoplasm of large intestine and rectum: Secondary | ICD-10-CM | POA: Diagnosis not present

## 2018-10-05 DIAGNOSIS — C642 Malignant neoplasm of left kidney, except renal pelvis: Secondary | ICD-10-CM | POA: Diagnosis not present

## 2018-10-05 DIAGNOSIS — C787 Secondary malignant neoplasm of liver and intrahepatic bile duct: Secondary | ICD-10-CM | POA: Diagnosis not present

## 2018-10-05 DIAGNOSIS — I272 Pulmonary hypertension, unspecified: Secondary | ICD-10-CM | POA: Diagnosis not present

## 2018-10-05 DIAGNOSIS — Z8679 Personal history of other diseases of the circulatory system: Secondary | ICD-10-CM | POA: Diagnosis not present

## 2018-10-05 DIAGNOSIS — J449 Chronic obstructive pulmonary disease, unspecified: Secondary | ICD-10-CM | POA: Diagnosis not present

## 2018-10-05 DIAGNOSIS — Z87891 Personal history of nicotine dependence: Secondary | ICD-10-CM | POA: Diagnosis not present

## 2018-10-09 DIAGNOSIS — E785 Hyperlipidemia, unspecified: Secondary | ICD-10-CM | POA: Diagnosis not present

## 2018-10-09 DIAGNOSIS — I5030 Unspecified diastolic (congestive) heart failure: Secondary | ICD-10-CM | POA: Diagnosis not present

## 2018-10-09 DIAGNOSIS — C787 Secondary malignant neoplasm of liver and intrahepatic bile duct: Secondary | ICD-10-CM | POA: Diagnosis not present

## 2018-10-09 DIAGNOSIS — I110 Hypertensive heart disease with heart failure: Secondary | ICD-10-CM | POA: Diagnosis not present

## 2018-10-09 DIAGNOSIS — C785 Secondary malignant neoplasm of large intestine and rectum: Secondary | ICD-10-CM | POA: Diagnosis not present

## 2018-10-09 DIAGNOSIS — C642 Malignant neoplasm of left kidney, except renal pelvis: Secondary | ICD-10-CM | POA: Diagnosis not present

## 2018-10-10 DIAGNOSIS — I110 Hypertensive heart disease with heart failure: Secondary | ICD-10-CM | POA: Diagnosis not present

## 2018-10-10 DIAGNOSIS — I5030 Unspecified diastolic (congestive) heart failure: Secondary | ICD-10-CM | POA: Diagnosis not present

## 2018-10-10 DIAGNOSIS — C787 Secondary malignant neoplasm of liver and intrahepatic bile duct: Secondary | ICD-10-CM | POA: Diagnosis not present

## 2018-10-10 DIAGNOSIS — E785 Hyperlipidemia, unspecified: Secondary | ICD-10-CM | POA: Diagnosis not present

## 2018-10-10 DIAGNOSIS — C785 Secondary malignant neoplasm of large intestine and rectum: Secondary | ICD-10-CM | POA: Diagnosis not present

## 2018-10-10 DIAGNOSIS — C642 Malignant neoplasm of left kidney, except renal pelvis: Secondary | ICD-10-CM | POA: Diagnosis not present

## 2018-10-14 DIAGNOSIS — C642 Malignant neoplasm of left kidney, except renal pelvis: Secondary | ICD-10-CM | POA: Diagnosis not present

## 2018-10-14 DIAGNOSIS — C785 Secondary malignant neoplasm of large intestine and rectum: Secondary | ICD-10-CM | POA: Diagnosis not present

## 2018-10-14 DIAGNOSIS — E785 Hyperlipidemia, unspecified: Secondary | ICD-10-CM | POA: Diagnosis not present

## 2018-10-14 DIAGNOSIS — I110 Hypertensive heart disease with heart failure: Secondary | ICD-10-CM | POA: Diagnosis not present

## 2018-10-14 DIAGNOSIS — C787 Secondary malignant neoplasm of liver and intrahepatic bile duct: Secondary | ICD-10-CM | POA: Diagnosis not present

## 2018-10-14 DIAGNOSIS — I5030 Unspecified diastolic (congestive) heart failure: Secondary | ICD-10-CM | POA: Diagnosis not present

## 2018-10-18 DIAGNOSIS — I110 Hypertensive heart disease with heart failure: Secondary | ICD-10-CM | POA: Diagnosis not present

## 2018-10-18 DIAGNOSIS — C642 Malignant neoplasm of left kidney, except renal pelvis: Secondary | ICD-10-CM | POA: Diagnosis not present

## 2018-10-18 DIAGNOSIS — C785 Secondary malignant neoplasm of large intestine and rectum: Secondary | ICD-10-CM | POA: Diagnosis not present

## 2018-10-18 DIAGNOSIS — I5030 Unspecified diastolic (congestive) heart failure: Secondary | ICD-10-CM | POA: Diagnosis not present

## 2018-10-18 DIAGNOSIS — E785 Hyperlipidemia, unspecified: Secondary | ICD-10-CM | POA: Diagnosis not present

## 2018-10-18 DIAGNOSIS — C787 Secondary malignant neoplasm of liver and intrahepatic bile duct: Secondary | ICD-10-CM | POA: Diagnosis not present

## 2018-10-20 ENCOUNTER — Other Ambulatory Visit: Payer: Self-pay | Admitting: Family Medicine

## 2018-10-23 DIAGNOSIS — I110 Hypertensive heart disease with heart failure: Secondary | ICD-10-CM | POA: Diagnosis not present

## 2018-10-23 DIAGNOSIS — I5030 Unspecified diastolic (congestive) heart failure: Secondary | ICD-10-CM | POA: Diagnosis not present

## 2018-10-23 DIAGNOSIS — C642 Malignant neoplasm of left kidney, except renal pelvis: Secondary | ICD-10-CM | POA: Diagnosis not present

## 2018-10-23 DIAGNOSIS — C785 Secondary malignant neoplasm of large intestine and rectum: Secondary | ICD-10-CM | POA: Diagnosis not present

## 2018-10-23 DIAGNOSIS — E785 Hyperlipidemia, unspecified: Secondary | ICD-10-CM | POA: Diagnosis not present

## 2018-10-23 DIAGNOSIS — C787 Secondary malignant neoplasm of liver and intrahepatic bile duct: Secondary | ICD-10-CM | POA: Diagnosis not present

## 2018-10-23 NOTE — Telephone Encounter (Signed)
Prednisone Last filled:  09/30/18, #15 Last OV:  07/16/18, f/u Next OV:  none

## 2018-10-23 NOTE — Telephone Encounter (Signed)
Hospice pt. On prednisone in h/o COPD.

## 2018-10-25 DIAGNOSIS — C787 Secondary malignant neoplasm of liver and intrahepatic bile duct: Secondary | ICD-10-CM | POA: Diagnosis not present

## 2018-10-25 DIAGNOSIS — C785 Secondary malignant neoplasm of large intestine and rectum: Secondary | ICD-10-CM | POA: Diagnosis not present

## 2018-10-25 DIAGNOSIS — C642 Malignant neoplasm of left kidney, except renal pelvis: Secondary | ICD-10-CM | POA: Diagnosis not present

## 2018-10-25 DIAGNOSIS — I110 Hypertensive heart disease with heart failure: Secondary | ICD-10-CM | POA: Diagnosis not present

## 2018-10-25 DIAGNOSIS — E785 Hyperlipidemia, unspecified: Secondary | ICD-10-CM | POA: Diagnosis not present

## 2018-10-25 DIAGNOSIS — I5030 Unspecified diastolic (congestive) heart failure: Secondary | ICD-10-CM | POA: Diagnosis not present

## 2018-10-29 DIAGNOSIS — C787 Secondary malignant neoplasm of liver and intrahepatic bile duct: Secondary | ICD-10-CM | POA: Diagnosis not present

## 2018-10-29 DIAGNOSIS — D630 Anemia in neoplastic disease: Secondary | ICD-10-CM | POA: Diagnosis not present

## 2018-10-29 DIAGNOSIS — J449 Chronic obstructive pulmonary disease, unspecified: Secondary | ICD-10-CM | POA: Diagnosis not present

## 2018-10-29 DIAGNOSIS — I272 Pulmonary hypertension, unspecified: Secondary | ICD-10-CM

## 2018-10-29 DIAGNOSIS — I5030 Unspecified diastolic (congestive) heart failure: Secondary | ICD-10-CM | POA: Diagnosis not present

## 2018-10-29 DIAGNOSIS — Z9981 Dependence on supplemental oxygen: Secondary | ICD-10-CM

## 2018-10-29 DIAGNOSIS — I110 Hypertensive heart disease with heart failure: Secondary | ICD-10-CM | POA: Diagnosis not present

## 2018-10-29 DIAGNOSIS — C642 Malignant neoplasm of left kidney, except renal pelvis: Secondary | ICD-10-CM | POA: Diagnosis not present

## 2018-10-29 DIAGNOSIS — J9610 Chronic respiratory failure, unspecified whether with hypoxia or hypercapnia: Secondary | ICD-10-CM | POA: Diagnosis not present

## 2018-10-29 DIAGNOSIS — C785 Secondary malignant neoplasm of large intestine and rectum: Secondary | ICD-10-CM | POA: Diagnosis not present

## 2018-10-29 DIAGNOSIS — I2510 Atherosclerotic heart disease of native coronary artery without angina pectoris: Secondary | ICD-10-CM | POA: Diagnosis not present

## 2018-10-29 DIAGNOSIS — M1990 Unspecified osteoarthritis, unspecified site: Secondary | ICD-10-CM

## 2018-10-29 DIAGNOSIS — Z8679 Personal history of other diseases of the circulatory system: Secondary | ICD-10-CM

## 2018-10-29 DIAGNOSIS — Z87891 Personal history of nicotine dependence: Secondary | ICD-10-CM

## 2018-10-29 DIAGNOSIS — K921 Melena: Secondary | ICD-10-CM | POA: Diagnosis not present

## 2018-10-29 DIAGNOSIS — E785 Hyperlipidemia, unspecified: Secondary | ICD-10-CM

## 2018-10-30 DIAGNOSIS — C642 Malignant neoplasm of left kidney, except renal pelvis: Secondary | ICD-10-CM | POA: Diagnosis not present

## 2018-10-30 DIAGNOSIS — C787 Secondary malignant neoplasm of liver and intrahepatic bile duct: Secondary | ICD-10-CM | POA: Diagnosis not present

## 2018-10-30 DIAGNOSIS — E785 Hyperlipidemia, unspecified: Secondary | ICD-10-CM | POA: Diagnosis not present

## 2018-10-30 DIAGNOSIS — I110 Hypertensive heart disease with heart failure: Secondary | ICD-10-CM | POA: Diagnosis not present

## 2018-10-30 DIAGNOSIS — I5030 Unspecified diastolic (congestive) heart failure: Secondary | ICD-10-CM | POA: Diagnosis not present

## 2018-10-30 DIAGNOSIS — C785 Secondary malignant neoplasm of large intestine and rectum: Secondary | ICD-10-CM | POA: Diagnosis not present

## 2018-11-05 DIAGNOSIS — I2510 Atherosclerotic heart disease of native coronary artery without angina pectoris: Secondary | ICD-10-CM | POA: Diagnosis not present

## 2018-11-05 DIAGNOSIS — Z8679 Personal history of other diseases of the circulatory system: Secondary | ICD-10-CM | POA: Diagnosis not present

## 2018-11-05 DIAGNOSIS — J9610 Chronic respiratory failure, unspecified whether with hypoxia or hypercapnia: Secondary | ICD-10-CM | POA: Diagnosis not present

## 2018-11-05 DIAGNOSIS — Z9981 Dependence on supplemental oxygen: Secondary | ICD-10-CM | POA: Diagnosis not present

## 2018-11-05 DIAGNOSIS — J449 Chronic obstructive pulmonary disease, unspecified: Secondary | ICD-10-CM | POA: Diagnosis not present

## 2018-11-05 DIAGNOSIS — D630 Anemia in neoplastic disease: Secondary | ICD-10-CM | POA: Diagnosis not present

## 2018-11-05 DIAGNOSIS — I272 Pulmonary hypertension, unspecified: Secondary | ICD-10-CM | POA: Diagnosis not present

## 2018-11-05 DIAGNOSIS — C787 Secondary malignant neoplasm of liver and intrahepatic bile duct: Secondary | ICD-10-CM | POA: Diagnosis not present

## 2018-11-05 DIAGNOSIS — C785 Secondary malignant neoplasm of large intestine and rectum: Secondary | ICD-10-CM | POA: Diagnosis not present

## 2018-11-05 DIAGNOSIS — I5030 Unspecified diastolic (congestive) heart failure: Secondary | ICD-10-CM | POA: Diagnosis not present

## 2018-11-05 DIAGNOSIS — I110 Hypertensive heart disease with heart failure: Secondary | ICD-10-CM | POA: Diagnosis not present

## 2018-11-05 DIAGNOSIS — E785 Hyperlipidemia, unspecified: Secondary | ICD-10-CM | POA: Diagnosis not present

## 2018-11-05 DIAGNOSIS — C642 Malignant neoplasm of left kidney, except renal pelvis: Secondary | ICD-10-CM | POA: Diagnosis not present

## 2018-11-05 DIAGNOSIS — M1990 Unspecified osteoarthritis, unspecified site: Secondary | ICD-10-CM | POA: Diagnosis not present

## 2018-11-05 DIAGNOSIS — K921 Melena: Secondary | ICD-10-CM | POA: Diagnosis not present

## 2018-11-05 DIAGNOSIS — Z87891 Personal history of nicotine dependence: Secondary | ICD-10-CM | POA: Diagnosis not present

## 2018-11-06 DIAGNOSIS — C642 Malignant neoplasm of left kidney, except renal pelvis: Secondary | ICD-10-CM | POA: Diagnosis not present

## 2018-11-06 DIAGNOSIS — C787 Secondary malignant neoplasm of liver and intrahepatic bile duct: Secondary | ICD-10-CM | POA: Diagnosis not present

## 2018-11-06 DIAGNOSIS — C785 Secondary malignant neoplasm of large intestine and rectum: Secondary | ICD-10-CM | POA: Diagnosis not present

## 2018-11-06 DIAGNOSIS — E785 Hyperlipidemia, unspecified: Secondary | ICD-10-CM | POA: Diagnosis not present

## 2018-11-06 DIAGNOSIS — I110 Hypertensive heart disease with heart failure: Secondary | ICD-10-CM | POA: Diagnosis not present

## 2018-11-06 DIAGNOSIS — I5030 Unspecified diastolic (congestive) heart failure: Secondary | ICD-10-CM | POA: Diagnosis not present

## 2018-11-08 DIAGNOSIS — E785 Hyperlipidemia, unspecified: Secondary | ICD-10-CM | POA: Diagnosis not present

## 2018-11-08 DIAGNOSIS — C642 Malignant neoplasm of left kidney, except renal pelvis: Secondary | ICD-10-CM | POA: Diagnosis not present

## 2018-11-08 DIAGNOSIS — C785 Secondary malignant neoplasm of large intestine and rectum: Secondary | ICD-10-CM | POA: Diagnosis not present

## 2018-11-08 DIAGNOSIS — I5030 Unspecified diastolic (congestive) heart failure: Secondary | ICD-10-CM | POA: Diagnosis not present

## 2018-11-08 DIAGNOSIS — I110 Hypertensive heart disease with heart failure: Secondary | ICD-10-CM | POA: Diagnosis not present

## 2018-11-08 DIAGNOSIS — C787 Secondary malignant neoplasm of liver and intrahepatic bile duct: Secondary | ICD-10-CM | POA: Diagnosis not present

## 2018-11-10 DIAGNOSIS — I110 Hypertensive heart disease with heart failure: Secondary | ICD-10-CM | POA: Diagnosis not present

## 2018-11-10 DIAGNOSIS — E785 Hyperlipidemia, unspecified: Secondary | ICD-10-CM | POA: Diagnosis not present

## 2018-11-10 DIAGNOSIS — C642 Malignant neoplasm of left kidney, except renal pelvis: Secondary | ICD-10-CM | POA: Diagnosis not present

## 2018-11-10 DIAGNOSIS — C785 Secondary malignant neoplasm of large intestine and rectum: Secondary | ICD-10-CM | POA: Diagnosis not present

## 2018-11-10 DIAGNOSIS — C787 Secondary malignant neoplasm of liver and intrahepatic bile duct: Secondary | ICD-10-CM | POA: Diagnosis not present

## 2018-11-10 DIAGNOSIS — I5030 Unspecified diastolic (congestive) heart failure: Secondary | ICD-10-CM | POA: Diagnosis not present

## 2018-11-12 DIAGNOSIS — C787 Secondary malignant neoplasm of liver and intrahepatic bile duct: Secondary | ICD-10-CM | POA: Diagnosis not present

## 2018-11-12 DIAGNOSIS — C785 Secondary malignant neoplasm of large intestine and rectum: Secondary | ICD-10-CM | POA: Diagnosis not present

## 2018-11-12 DIAGNOSIS — I5030 Unspecified diastolic (congestive) heart failure: Secondary | ICD-10-CM | POA: Diagnosis not present

## 2018-11-12 DIAGNOSIS — C642 Malignant neoplasm of left kidney, except renal pelvis: Secondary | ICD-10-CM | POA: Diagnosis not present

## 2018-11-12 DIAGNOSIS — E785 Hyperlipidemia, unspecified: Secondary | ICD-10-CM | POA: Diagnosis not present

## 2018-11-12 DIAGNOSIS — I110 Hypertensive heart disease with heart failure: Secondary | ICD-10-CM | POA: Diagnosis not present

## 2018-11-15 ENCOUNTER — Telehealth: Payer: Self-pay

## 2018-11-15 DIAGNOSIS — I110 Hypertensive heart disease with heart failure: Secondary | ICD-10-CM | POA: Diagnosis not present

## 2018-11-15 DIAGNOSIS — C787 Secondary malignant neoplasm of liver and intrahepatic bile duct: Secondary | ICD-10-CM | POA: Diagnosis not present

## 2018-11-15 DIAGNOSIS — C642 Malignant neoplasm of left kidney, except renal pelvis: Secondary | ICD-10-CM | POA: Diagnosis not present

## 2018-11-15 DIAGNOSIS — E785 Hyperlipidemia, unspecified: Secondary | ICD-10-CM | POA: Diagnosis not present

## 2018-11-15 DIAGNOSIS — C785 Secondary malignant neoplasm of large intestine and rectum: Secondary | ICD-10-CM | POA: Diagnosis not present

## 2018-11-15 DIAGNOSIS — I5030 Unspecified diastolic (congestive) heart failure: Secondary | ICD-10-CM | POA: Diagnosis not present

## 2018-11-15 NOTE — Telephone Encounter (Signed)
Mitzi nurse with [facility] left v/m that pt had possible injury ? While getting something out of refrigerator; pt had lower back pain that radiated across her back; pt described as shooting,stabbing nerve type pain. Mitzi first tried med for muscle by using muscle relaxant, alternating cold and heat but the back pain is worse. Mitzi said it could be pinched nerve or spine pain. Pain level now is 10. Pt has taken 5 doses of morphine total of 50 mg of morphine since 12:30 AM this morning. Pt does not usually take any pain med. Mitzi having a hard time convincing pt to go to ED or hospice home [place] request cb ASAP.

## 2018-11-15 NOTE — Telephone Encounter (Signed)
Spoke w Mitzi hospice RN who placed catheter, drained 750cc of urine, bag filling up again, with significant relief in pain. Pt has decided to go to hospice home for symptom management at least overnight. Agree with plan.

## 2018-11-16 DIAGNOSIS — I110 Hypertensive heart disease with heart failure: Secondary | ICD-10-CM | POA: Diagnosis not present

## 2018-11-16 DIAGNOSIS — C787 Secondary malignant neoplasm of liver and intrahepatic bile duct: Secondary | ICD-10-CM | POA: Diagnosis not present

## 2018-11-16 DIAGNOSIS — I5030 Unspecified diastolic (congestive) heart failure: Secondary | ICD-10-CM | POA: Diagnosis not present

## 2018-11-16 DIAGNOSIS — C642 Malignant neoplasm of left kidney, except renal pelvis: Secondary | ICD-10-CM | POA: Diagnosis not present

## 2018-11-16 DIAGNOSIS — C785 Secondary malignant neoplasm of large intestine and rectum: Secondary | ICD-10-CM | POA: Diagnosis not present

## 2018-11-16 DIAGNOSIS — E785 Hyperlipidemia, unspecified: Secondary | ICD-10-CM | POA: Diagnosis not present

## 2018-11-17 DIAGNOSIS — C785 Secondary malignant neoplasm of large intestine and rectum: Secondary | ICD-10-CM | POA: Diagnosis not present

## 2018-11-17 DIAGNOSIS — I110 Hypertensive heart disease with heart failure: Secondary | ICD-10-CM | POA: Diagnosis not present

## 2018-11-17 DIAGNOSIS — E785 Hyperlipidemia, unspecified: Secondary | ICD-10-CM | POA: Diagnosis not present

## 2018-11-17 DIAGNOSIS — C642 Malignant neoplasm of left kidney, except renal pelvis: Secondary | ICD-10-CM | POA: Diagnosis not present

## 2018-11-17 DIAGNOSIS — C787 Secondary malignant neoplasm of liver and intrahepatic bile duct: Secondary | ICD-10-CM | POA: Diagnosis not present

## 2018-11-17 DIAGNOSIS — I5030 Unspecified diastolic (congestive) heart failure: Secondary | ICD-10-CM | POA: Diagnosis not present

## 2018-11-18 DIAGNOSIS — I5030 Unspecified diastolic (congestive) heart failure: Secondary | ICD-10-CM | POA: Diagnosis not present

## 2018-11-18 DIAGNOSIS — I110 Hypertensive heart disease with heart failure: Secondary | ICD-10-CM | POA: Diagnosis not present

## 2018-11-18 DIAGNOSIS — C642 Malignant neoplasm of left kidney, except renal pelvis: Secondary | ICD-10-CM | POA: Diagnosis not present

## 2018-11-18 DIAGNOSIS — C787 Secondary malignant neoplasm of liver and intrahepatic bile duct: Secondary | ICD-10-CM | POA: Diagnosis not present

## 2018-11-18 DIAGNOSIS — C785 Secondary malignant neoplasm of large intestine and rectum: Secondary | ICD-10-CM | POA: Diagnosis not present

## 2018-11-18 DIAGNOSIS — E785 Hyperlipidemia, unspecified: Secondary | ICD-10-CM | POA: Diagnosis not present

## 2018-11-19 DIAGNOSIS — C642 Malignant neoplasm of left kidney, except renal pelvis: Secondary | ICD-10-CM | POA: Diagnosis not present

## 2018-11-19 DIAGNOSIS — C785 Secondary malignant neoplasm of large intestine and rectum: Secondary | ICD-10-CM | POA: Diagnosis not present

## 2018-11-19 DIAGNOSIS — I5030 Unspecified diastolic (congestive) heart failure: Secondary | ICD-10-CM | POA: Diagnosis not present

## 2018-11-19 DIAGNOSIS — I110 Hypertensive heart disease with heart failure: Secondary | ICD-10-CM | POA: Diagnosis not present

## 2018-11-19 DIAGNOSIS — C787 Secondary malignant neoplasm of liver and intrahepatic bile duct: Secondary | ICD-10-CM | POA: Diagnosis not present

## 2018-11-19 DIAGNOSIS — E785 Hyperlipidemia, unspecified: Secondary | ICD-10-CM | POA: Diagnosis not present

## 2018-11-20 ENCOUNTER — Other Ambulatory Visit: Payer: Self-pay | Admitting: Family Medicine

## 2018-11-20 DIAGNOSIS — C785 Secondary malignant neoplasm of large intestine and rectum: Secondary | ICD-10-CM | POA: Diagnosis not present

## 2018-11-20 DIAGNOSIS — E785 Hyperlipidemia, unspecified: Secondary | ICD-10-CM | POA: Diagnosis not present

## 2018-11-20 DIAGNOSIS — I110 Hypertensive heart disease with heart failure: Secondary | ICD-10-CM | POA: Diagnosis not present

## 2018-11-20 DIAGNOSIS — C642 Malignant neoplasm of left kidney, except renal pelvis: Secondary | ICD-10-CM | POA: Diagnosis not present

## 2018-11-20 DIAGNOSIS — C787 Secondary malignant neoplasm of liver and intrahepatic bile duct: Secondary | ICD-10-CM | POA: Diagnosis not present

## 2018-11-20 DIAGNOSIS — I5030 Unspecified diastolic (congestive) heart failure: Secondary | ICD-10-CM | POA: Diagnosis not present

## 2018-11-20 NOTE — Telephone Encounter (Addendum)
Left message for pt to call back.  Med not on current list.  Denied, last rx 09/01/2011.  Will need office visit for refill.

## 2018-11-21 DIAGNOSIS — C642 Malignant neoplasm of left kidney, except renal pelvis: Secondary | ICD-10-CM | POA: Diagnosis not present

## 2018-11-21 DIAGNOSIS — E785 Hyperlipidemia, unspecified: Secondary | ICD-10-CM | POA: Diagnosis not present

## 2018-11-21 DIAGNOSIS — I110 Hypertensive heart disease with heart failure: Secondary | ICD-10-CM | POA: Diagnosis not present

## 2018-11-21 DIAGNOSIS — C785 Secondary malignant neoplasm of large intestine and rectum: Secondary | ICD-10-CM | POA: Diagnosis not present

## 2018-11-21 DIAGNOSIS — I5030 Unspecified diastolic (congestive) heart failure: Secondary | ICD-10-CM | POA: Diagnosis not present

## 2018-11-21 DIAGNOSIS — C787 Secondary malignant neoplasm of liver and intrahepatic bile duct: Secondary | ICD-10-CM | POA: Diagnosis not present

## 2018-11-21 NOTE — Telephone Encounter (Signed)
Spoke with daughter - pt just came back home from hospice home. Pain under better control. Still has catheter in place. No longer ambulatory due to pain. Using dilaudid and ativan Q6 hours for pain. Nurses have said she may be transitioning.

## 2018-11-22 DIAGNOSIS — I110 Hypertensive heart disease with heart failure: Secondary | ICD-10-CM | POA: Diagnosis not present

## 2018-11-22 DIAGNOSIS — I5030 Unspecified diastolic (congestive) heart failure: Secondary | ICD-10-CM | POA: Diagnosis not present

## 2018-11-22 DIAGNOSIS — C642 Malignant neoplasm of left kidney, except renal pelvis: Secondary | ICD-10-CM | POA: Diagnosis not present

## 2018-11-22 DIAGNOSIS — C785 Secondary malignant neoplasm of large intestine and rectum: Secondary | ICD-10-CM | POA: Diagnosis not present

## 2018-11-22 DIAGNOSIS — E785 Hyperlipidemia, unspecified: Secondary | ICD-10-CM | POA: Diagnosis not present

## 2018-11-22 DIAGNOSIS — C787 Secondary malignant neoplasm of liver and intrahepatic bile duct: Secondary | ICD-10-CM | POA: Diagnosis not present

## 2018-11-25 DIAGNOSIS — C642 Malignant neoplasm of left kidney, except renal pelvis: Secondary | ICD-10-CM | POA: Diagnosis not present

## 2018-11-25 DIAGNOSIS — C787 Secondary malignant neoplasm of liver and intrahepatic bile duct: Secondary | ICD-10-CM | POA: Diagnosis not present

## 2018-11-25 DIAGNOSIS — E785 Hyperlipidemia, unspecified: Secondary | ICD-10-CM | POA: Diagnosis not present

## 2018-11-25 DIAGNOSIS — I5030 Unspecified diastolic (congestive) heart failure: Secondary | ICD-10-CM | POA: Diagnosis not present

## 2018-11-25 DIAGNOSIS — C785 Secondary malignant neoplasm of large intestine and rectum: Secondary | ICD-10-CM | POA: Diagnosis not present

## 2018-11-25 DIAGNOSIS — I110 Hypertensive heart disease with heart failure: Secondary | ICD-10-CM | POA: Diagnosis not present

## 2018-11-28 DIAGNOSIS — E785 Hyperlipidemia, unspecified: Secondary | ICD-10-CM | POA: Diagnosis not present

## 2018-11-28 DIAGNOSIS — I110 Hypertensive heart disease with heart failure: Secondary | ICD-10-CM | POA: Diagnosis not present

## 2018-11-28 DIAGNOSIS — C642 Malignant neoplasm of left kidney, except renal pelvis: Secondary | ICD-10-CM | POA: Diagnosis not present

## 2018-11-28 DIAGNOSIS — I5030 Unspecified diastolic (congestive) heart failure: Secondary | ICD-10-CM | POA: Diagnosis not present

## 2018-11-28 DIAGNOSIS — C785 Secondary malignant neoplasm of large intestine and rectum: Secondary | ICD-10-CM | POA: Diagnosis not present

## 2018-11-28 DIAGNOSIS — C787 Secondary malignant neoplasm of liver and intrahepatic bile duct: Secondary | ICD-10-CM | POA: Diagnosis not present

## 2018-11-29 DIAGNOSIS — C787 Secondary malignant neoplasm of liver and intrahepatic bile duct: Secondary | ICD-10-CM | POA: Diagnosis not present

## 2018-11-29 DIAGNOSIS — C785 Secondary malignant neoplasm of large intestine and rectum: Secondary | ICD-10-CM | POA: Diagnosis not present

## 2018-11-29 DIAGNOSIS — C642 Malignant neoplasm of left kidney, except renal pelvis: Secondary | ICD-10-CM | POA: Diagnosis not present

## 2018-11-29 DIAGNOSIS — I110 Hypertensive heart disease with heart failure: Secondary | ICD-10-CM | POA: Diagnosis not present

## 2018-11-29 DIAGNOSIS — E785 Hyperlipidemia, unspecified: Secondary | ICD-10-CM | POA: Diagnosis not present

## 2018-11-29 DIAGNOSIS — I5030 Unspecified diastolic (congestive) heart failure: Secondary | ICD-10-CM | POA: Diagnosis not present

## 2018-12-01 DIAGNOSIS — E785 Hyperlipidemia, unspecified: Secondary | ICD-10-CM | POA: Diagnosis not present

## 2018-12-01 DIAGNOSIS — C785 Secondary malignant neoplasm of large intestine and rectum: Secondary | ICD-10-CM | POA: Diagnosis not present

## 2018-12-01 DIAGNOSIS — C787 Secondary malignant neoplasm of liver and intrahepatic bile duct: Secondary | ICD-10-CM | POA: Diagnosis not present

## 2018-12-01 DIAGNOSIS — C642 Malignant neoplasm of left kidney, except renal pelvis: Secondary | ICD-10-CM | POA: Diagnosis not present

## 2018-12-01 DIAGNOSIS — I5030 Unspecified diastolic (congestive) heart failure: Secondary | ICD-10-CM | POA: Diagnosis not present

## 2018-12-01 DIAGNOSIS — I110 Hypertensive heart disease with heart failure: Secondary | ICD-10-CM | POA: Diagnosis not present

## 2018-12-02 DIAGNOSIS — C785 Secondary malignant neoplasm of large intestine and rectum: Secondary | ICD-10-CM | POA: Diagnosis not present

## 2018-12-02 DIAGNOSIS — I110 Hypertensive heart disease with heart failure: Secondary | ICD-10-CM | POA: Diagnosis not present

## 2018-12-02 DIAGNOSIS — E785 Hyperlipidemia, unspecified: Secondary | ICD-10-CM | POA: Diagnosis not present

## 2018-12-02 DIAGNOSIS — C642 Malignant neoplasm of left kidney, except renal pelvis: Secondary | ICD-10-CM | POA: Diagnosis not present

## 2018-12-02 DIAGNOSIS — C787 Secondary malignant neoplasm of liver and intrahepatic bile duct: Secondary | ICD-10-CM | POA: Diagnosis not present

## 2018-12-02 DIAGNOSIS — I5030 Unspecified diastolic (congestive) heart failure: Secondary | ICD-10-CM | POA: Diagnosis not present

## 2018-12-03 DIAGNOSIS — I110 Hypertensive heart disease with heart failure: Secondary | ICD-10-CM | POA: Diagnosis not present

## 2018-12-03 DIAGNOSIS — I5030 Unspecified diastolic (congestive) heart failure: Secondary | ICD-10-CM | POA: Diagnosis not present

## 2018-12-03 DIAGNOSIS — C787 Secondary malignant neoplasm of liver and intrahepatic bile duct: Secondary | ICD-10-CM | POA: Diagnosis not present

## 2018-12-03 DIAGNOSIS — C785 Secondary malignant neoplasm of large intestine and rectum: Secondary | ICD-10-CM | POA: Diagnosis not present

## 2018-12-03 DIAGNOSIS — C642 Malignant neoplasm of left kidney, except renal pelvis: Secondary | ICD-10-CM | POA: Diagnosis not present

## 2018-12-03 DIAGNOSIS — E785 Hyperlipidemia, unspecified: Secondary | ICD-10-CM | POA: Diagnosis not present

## 2018-12-04 DIAGNOSIS — C787 Secondary malignant neoplasm of liver and intrahepatic bile duct: Secondary | ICD-10-CM | POA: Diagnosis not present

## 2018-12-04 DIAGNOSIS — E785 Hyperlipidemia, unspecified: Secondary | ICD-10-CM | POA: Diagnosis not present

## 2018-12-04 DIAGNOSIS — C642 Malignant neoplasm of left kidney, except renal pelvis: Secondary | ICD-10-CM | POA: Diagnosis not present

## 2018-12-04 DIAGNOSIS — C785 Secondary malignant neoplasm of large intestine and rectum: Secondary | ICD-10-CM | POA: Diagnosis not present

## 2018-12-04 DIAGNOSIS — I110 Hypertensive heart disease with heart failure: Secondary | ICD-10-CM | POA: Diagnosis not present

## 2018-12-04 DIAGNOSIS — I5030 Unspecified diastolic (congestive) heart failure: Secondary | ICD-10-CM | POA: Diagnosis not present

## 2018-12-05 DIAGNOSIS — D630 Anemia in neoplastic disease: Secondary | ICD-10-CM | POA: Diagnosis not present

## 2018-12-05 DIAGNOSIS — Z8679 Personal history of other diseases of the circulatory system: Secondary | ICD-10-CM | POA: Diagnosis not present

## 2018-12-05 DIAGNOSIS — Z87891 Personal history of nicotine dependence: Secondary | ICD-10-CM | POA: Diagnosis not present

## 2018-12-05 DIAGNOSIS — E785 Hyperlipidemia, unspecified: Secondary | ICD-10-CM | POA: Diagnosis not present

## 2018-12-05 DIAGNOSIS — I272 Pulmonary hypertension, unspecified: Secondary | ICD-10-CM | POA: Diagnosis not present

## 2018-12-05 DIAGNOSIS — Z9981 Dependence on supplemental oxygen: Secondary | ICD-10-CM | POA: Diagnosis not present

## 2018-12-05 DIAGNOSIS — C642 Malignant neoplasm of left kidney, except renal pelvis: Secondary | ICD-10-CM | POA: Diagnosis not present

## 2018-12-05 DIAGNOSIS — I5030 Unspecified diastolic (congestive) heart failure: Secondary | ICD-10-CM | POA: Diagnosis not present

## 2018-12-05 DIAGNOSIS — K921 Melena: Secondary | ICD-10-CM | POA: Diagnosis not present

## 2018-12-05 DIAGNOSIS — J449 Chronic obstructive pulmonary disease, unspecified: Secondary | ICD-10-CM | POA: Diagnosis not present

## 2018-12-05 DIAGNOSIS — C785 Secondary malignant neoplasm of large intestine and rectum: Secondary | ICD-10-CM | POA: Diagnosis not present

## 2018-12-05 DIAGNOSIS — I110 Hypertensive heart disease with heart failure: Secondary | ICD-10-CM | POA: Diagnosis not present

## 2018-12-05 DIAGNOSIS — C787 Secondary malignant neoplasm of liver and intrahepatic bile duct: Secondary | ICD-10-CM | POA: Diagnosis not present

## 2018-12-05 DIAGNOSIS — I2510 Atherosclerotic heart disease of native coronary artery without angina pectoris: Secondary | ICD-10-CM | POA: Diagnosis not present

## 2018-12-05 DIAGNOSIS — J9610 Chronic respiratory failure, unspecified whether with hypoxia or hypercapnia: Secondary | ICD-10-CM | POA: Diagnosis not present

## 2018-12-05 DIAGNOSIS — M1990 Unspecified osteoarthritis, unspecified site: Secondary | ICD-10-CM | POA: Diagnosis not present

## 2018-12-06 ENCOUNTER — Other Ambulatory Visit: Payer: Self-pay | Admitting: *Deleted

## 2018-12-06 DIAGNOSIS — E785 Hyperlipidemia, unspecified: Secondary | ICD-10-CM | POA: Diagnosis not present

## 2018-12-06 DIAGNOSIS — C785 Secondary malignant neoplasm of large intestine and rectum: Secondary | ICD-10-CM | POA: Diagnosis not present

## 2018-12-06 DIAGNOSIS — C787 Secondary malignant neoplasm of liver and intrahepatic bile duct: Secondary | ICD-10-CM | POA: Diagnosis not present

## 2018-12-06 DIAGNOSIS — I110 Hypertensive heart disease with heart failure: Secondary | ICD-10-CM | POA: Diagnosis not present

## 2018-12-06 DIAGNOSIS — I5030 Unspecified diastolic (congestive) heart failure: Secondary | ICD-10-CM | POA: Diagnosis not present

## 2018-12-06 DIAGNOSIS — C642 Malignant neoplasm of left kidney, except renal pelvis: Secondary | ICD-10-CM | POA: Diagnosis not present

## 2018-12-06 MED ORDER — HYDROMORPHONE HCL 2 MG PO TABS: 4.0000 mg | ORAL_TABLET | Freq: Four times a day (QID) | ORAL | 0 refills | Status: AC

## 2018-12-06 NOTE — Telephone Encounter (Signed)
Mitzie notified as instructed by telephone and verbalized understanding.

## 2018-12-06 NOTE — Telephone Encounter (Signed)
Mitzie hospice nurse called stating that patient was put on Dilaudid 2 mg, two every 6 hours as scheduled. Mitzie stated that they are almost out of the Dilaudid and apologized for the short notice. Mitzie stated that patient is transitioning. Patient was started on Dilaudid while in the [facility] and has worked beautifully for her and really helps with the pain. Pharmacy: [facility]

## 2018-12-06 NOTE — Telephone Encounter (Signed)
Thank you for the update. Dilaudid refilled for patient, 1 wk supply.  Update [place] next week with effect.

## 2018-12-09 DIAGNOSIS — C785 Secondary malignant neoplasm of large intestine and rectum: Secondary | ICD-10-CM | POA: Diagnosis not present

## 2018-12-09 DIAGNOSIS — I5030 Unspecified diastolic (congestive) heart failure: Secondary | ICD-10-CM | POA: Diagnosis not present

## 2018-12-09 DIAGNOSIS — E785 Hyperlipidemia, unspecified: Secondary | ICD-10-CM | POA: Diagnosis not present

## 2018-12-09 DIAGNOSIS — C787 Secondary malignant neoplasm of liver and intrahepatic bile duct: Secondary | ICD-10-CM | POA: Diagnosis not present

## 2018-12-09 DIAGNOSIS — I110 Hypertensive heart disease with heart failure: Secondary | ICD-10-CM | POA: Diagnosis not present

## 2018-12-09 DIAGNOSIS — C642 Malignant neoplasm of left kidney, except renal pelvis: Secondary | ICD-10-CM | POA: Diagnosis not present

## 2018-12-10 DIAGNOSIS — C787 Secondary malignant neoplasm of liver and intrahepatic bile duct: Secondary | ICD-10-CM | POA: Diagnosis not present

## 2018-12-10 DIAGNOSIS — C785 Secondary malignant neoplasm of large intestine and rectum: Secondary | ICD-10-CM | POA: Diagnosis not present

## 2018-12-10 DIAGNOSIS — I110 Hypertensive heart disease with heart failure: Secondary | ICD-10-CM | POA: Diagnosis not present

## 2018-12-10 DIAGNOSIS — C642 Malignant neoplasm of left kidney, except renal pelvis: Secondary | ICD-10-CM | POA: Diagnosis not present

## 2018-12-10 DIAGNOSIS — E785 Hyperlipidemia, unspecified: Secondary | ICD-10-CM | POA: Diagnosis not present

## 2018-12-10 DIAGNOSIS — I5030 Unspecified diastolic (congestive) heart failure: Secondary | ICD-10-CM | POA: Diagnosis not present

## 2018-12-11 DIAGNOSIS — C785 Secondary malignant neoplasm of large intestine and rectum: Secondary | ICD-10-CM | POA: Diagnosis not present

## 2018-12-11 DIAGNOSIS — C787 Secondary malignant neoplasm of liver and intrahepatic bile duct: Secondary | ICD-10-CM | POA: Diagnosis not present

## 2018-12-11 DIAGNOSIS — I110 Hypertensive heart disease with heart failure: Secondary | ICD-10-CM | POA: Diagnosis not present

## 2018-12-11 DIAGNOSIS — I5030 Unspecified diastolic (congestive) heart failure: Secondary | ICD-10-CM | POA: Diagnosis not present

## 2018-12-11 DIAGNOSIS — E785 Hyperlipidemia, unspecified: Secondary | ICD-10-CM | POA: Diagnosis not present

## 2018-12-11 DIAGNOSIS — C642 Malignant neoplasm of left kidney, except renal pelvis: Secondary | ICD-10-CM | POA: Diagnosis not present

## 2018-12-13 DIAGNOSIS — I110 Hypertensive heart disease with heart failure: Secondary | ICD-10-CM | POA: Diagnosis not present

## 2018-12-13 DIAGNOSIS — I5030 Unspecified diastolic (congestive) heart failure: Secondary | ICD-10-CM | POA: Diagnosis not present

## 2018-12-13 DIAGNOSIS — C787 Secondary malignant neoplasm of liver and intrahepatic bile duct: Secondary | ICD-10-CM | POA: Diagnosis not present

## 2018-12-13 DIAGNOSIS — E785 Hyperlipidemia, unspecified: Secondary | ICD-10-CM | POA: Diagnosis not present

## 2018-12-13 DIAGNOSIS — C642 Malignant neoplasm of left kidney, except renal pelvis: Secondary | ICD-10-CM | POA: Diagnosis not present

## 2018-12-13 DIAGNOSIS — C785 Secondary malignant neoplasm of large intestine and rectum: Secondary | ICD-10-CM | POA: Diagnosis not present

## 2018-12-14 DIAGNOSIS — E785 Hyperlipidemia, unspecified: Secondary | ICD-10-CM | POA: Diagnosis not present

## 2018-12-14 DIAGNOSIS — C787 Secondary malignant neoplasm of liver and intrahepatic bile duct: Secondary | ICD-10-CM | POA: Diagnosis not present

## 2018-12-14 DIAGNOSIS — C642 Malignant neoplasm of left kidney, except renal pelvis: Secondary | ICD-10-CM | POA: Diagnosis not present

## 2018-12-14 DIAGNOSIS — I5030 Unspecified diastolic (congestive) heart failure: Secondary | ICD-10-CM | POA: Diagnosis not present

## 2018-12-14 DIAGNOSIS — I110 Hypertensive heart disease with heart failure: Secondary | ICD-10-CM | POA: Diagnosis not present

## 2018-12-14 DIAGNOSIS — C785 Secondary malignant neoplasm of large intestine and rectum: Secondary | ICD-10-CM | POA: Diagnosis not present

## 2018-12-16 DIAGNOSIS — C787 Secondary malignant neoplasm of liver and intrahepatic bile duct: Secondary | ICD-10-CM | POA: Diagnosis not present

## 2018-12-16 DIAGNOSIS — E785 Hyperlipidemia, unspecified: Secondary | ICD-10-CM | POA: Diagnosis not present

## 2018-12-16 DIAGNOSIS — C785 Secondary malignant neoplasm of large intestine and rectum: Secondary | ICD-10-CM | POA: Diagnosis not present

## 2018-12-16 DIAGNOSIS — I110 Hypertensive heart disease with heart failure: Secondary | ICD-10-CM | POA: Diagnosis not present

## 2018-12-16 DIAGNOSIS — C642 Malignant neoplasm of left kidney, except renal pelvis: Secondary | ICD-10-CM | POA: Diagnosis not present

## 2018-12-16 DIAGNOSIS — I5030 Unspecified diastolic (congestive) heart failure: Secondary | ICD-10-CM | POA: Diagnosis not present

## 2018-12-18 DIAGNOSIS — I5030 Unspecified diastolic (congestive) heart failure: Secondary | ICD-10-CM | POA: Diagnosis not present

## 2018-12-18 DIAGNOSIS — I110 Hypertensive heart disease with heart failure: Secondary | ICD-10-CM | POA: Diagnosis not present

## 2018-12-18 DIAGNOSIS — E785 Hyperlipidemia, unspecified: Secondary | ICD-10-CM | POA: Diagnosis not present

## 2018-12-18 DIAGNOSIS — C642 Malignant neoplasm of left kidney, except renal pelvis: Secondary | ICD-10-CM | POA: Diagnosis not present

## 2018-12-18 DIAGNOSIS — C787 Secondary malignant neoplasm of liver and intrahepatic bile duct: Secondary | ICD-10-CM | POA: Diagnosis not present

## 2018-12-18 DIAGNOSIS — C785 Secondary malignant neoplasm of large intestine and rectum: Secondary | ICD-10-CM | POA: Diagnosis not present

## 2018-12-20 DIAGNOSIS — C787 Secondary malignant neoplasm of liver and intrahepatic bile duct: Secondary | ICD-10-CM | POA: Diagnosis not present

## 2018-12-20 DIAGNOSIS — C785 Secondary malignant neoplasm of large intestine and rectum: Secondary | ICD-10-CM | POA: Diagnosis not present

## 2018-12-20 DIAGNOSIS — I110 Hypertensive heart disease with heart failure: Secondary | ICD-10-CM | POA: Diagnosis not present

## 2018-12-20 DIAGNOSIS — E785 Hyperlipidemia, unspecified: Secondary | ICD-10-CM | POA: Diagnosis not present

## 2018-12-20 DIAGNOSIS — C642 Malignant neoplasm of left kidney, except renal pelvis: Secondary | ICD-10-CM | POA: Diagnosis not present

## 2018-12-20 DIAGNOSIS — I5030 Unspecified diastolic (congestive) heart failure: Secondary | ICD-10-CM | POA: Diagnosis not present

## 2018-12-21 DIAGNOSIS — C642 Malignant neoplasm of left kidney, except renal pelvis: Secondary | ICD-10-CM | POA: Diagnosis not present

## 2018-12-21 DIAGNOSIS — I5030 Unspecified diastolic (congestive) heart failure: Secondary | ICD-10-CM | POA: Diagnosis not present

## 2018-12-21 DIAGNOSIS — C787 Secondary malignant neoplasm of liver and intrahepatic bile duct: Secondary | ICD-10-CM | POA: Diagnosis not present

## 2018-12-21 DIAGNOSIS — E785 Hyperlipidemia, unspecified: Secondary | ICD-10-CM | POA: Diagnosis not present

## 2018-12-21 DIAGNOSIS — I110 Hypertensive heart disease with heart failure: Secondary | ICD-10-CM | POA: Diagnosis not present

## 2018-12-21 DIAGNOSIS — C785 Secondary malignant neoplasm of large intestine and rectum: Secondary | ICD-10-CM | POA: Diagnosis not present

## 2018-12-23 DIAGNOSIS — C785 Secondary malignant neoplasm of large intestine and rectum: Secondary | ICD-10-CM | POA: Diagnosis not present

## 2018-12-23 DIAGNOSIS — C642 Malignant neoplasm of left kidney, except renal pelvis: Secondary | ICD-10-CM | POA: Diagnosis not present

## 2018-12-23 DIAGNOSIS — I5030 Unspecified diastolic (congestive) heart failure: Secondary | ICD-10-CM | POA: Diagnosis not present

## 2018-12-23 DIAGNOSIS — E785 Hyperlipidemia, unspecified: Secondary | ICD-10-CM | POA: Diagnosis not present

## 2018-12-23 DIAGNOSIS — C787 Secondary malignant neoplasm of liver and intrahepatic bile duct: Secondary | ICD-10-CM | POA: Diagnosis not present

## 2018-12-23 DIAGNOSIS — I110 Hypertensive heart disease with heart failure: Secondary | ICD-10-CM | POA: Diagnosis not present

## 2018-12-25 DIAGNOSIS — I110 Hypertensive heart disease with heart failure: Secondary | ICD-10-CM | POA: Diagnosis not present

## 2018-12-25 DIAGNOSIS — C642 Malignant neoplasm of left kidney, except renal pelvis: Secondary | ICD-10-CM | POA: Diagnosis not present

## 2018-12-25 DIAGNOSIS — E785 Hyperlipidemia, unspecified: Secondary | ICD-10-CM | POA: Diagnosis not present

## 2018-12-25 DIAGNOSIS — C785 Secondary malignant neoplasm of large intestine and rectum: Secondary | ICD-10-CM | POA: Diagnosis not present

## 2018-12-25 DIAGNOSIS — C787 Secondary malignant neoplasm of liver and intrahepatic bile duct: Secondary | ICD-10-CM | POA: Diagnosis not present

## 2018-12-25 DIAGNOSIS — I5030 Unspecified diastolic (congestive) heart failure: Secondary | ICD-10-CM | POA: Diagnosis not present

## 2018-12-27 DIAGNOSIS — C787 Secondary malignant neoplasm of liver and intrahepatic bile duct: Secondary | ICD-10-CM | POA: Diagnosis not present

## 2018-12-27 DIAGNOSIS — E785 Hyperlipidemia, unspecified: Secondary | ICD-10-CM | POA: Diagnosis not present

## 2018-12-27 DIAGNOSIS — C642 Malignant neoplasm of left kidney, except renal pelvis: Secondary | ICD-10-CM | POA: Diagnosis not present

## 2018-12-27 DIAGNOSIS — C785 Secondary malignant neoplasm of large intestine and rectum: Secondary | ICD-10-CM | POA: Diagnosis not present

## 2018-12-27 DIAGNOSIS — I110 Hypertensive heart disease with heart failure: Secondary | ICD-10-CM | POA: Diagnosis not present

## 2018-12-27 DIAGNOSIS — I5030 Unspecified diastolic (congestive) heart failure: Secondary | ICD-10-CM | POA: Diagnosis not present

## 2018-12-30 DIAGNOSIS — E785 Hyperlipidemia, unspecified: Secondary | ICD-10-CM | POA: Diagnosis not present

## 2018-12-30 DIAGNOSIS — C642 Malignant neoplasm of left kidney, except renal pelvis: Secondary | ICD-10-CM | POA: Diagnosis not present

## 2018-12-30 DIAGNOSIS — I5030 Unspecified diastolic (congestive) heart failure: Secondary | ICD-10-CM | POA: Diagnosis not present

## 2018-12-30 DIAGNOSIS — I110 Hypertensive heart disease with heart failure: Secondary | ICD-10-CM | POA: Diagnosis not present

## 2018-12-30 DIAGNOSIS — C787 Secondary malignant neoplasm of liver and intrahepatic bile duct: Secondary | ICD-10-CM | POA: Diagnosis not present

## 2018-12-30 DIAGNOSIS — C785 Secondary malignant neoplasm of large intestine and rectum: Secondary | ICD-10-CM | POA: Diagnosis not present

## 2019-01-01 ENCOUNTER — Telehealth: Payer: Self-pay | Admitting: Family Medicine

## 2019-01-01 NOTE — Telephone Encounter (Addendum)
Received hospice notification of death 2023-01-24. Called daughter to express my condolences, unable to reach. Will try later.

## 2019-01-05 DEATH — deceased

## 2019-01-08 NOTE — Telephone Encounter (Signed)
Called and spoke with daughter, expressed my condolences.

## 2019-05-02 IMAGING — DX DG CHEST 1V PORT
1 series · 1 of 1 positions shown · non-contrast
Comparison: Chest radiograph May 31, 2012 [address] chest CT June 27, 2012

CLINICAL DATA: Shortness of breath and cough for 1 week

EXAM:
PORTABLE CHEST 1 VIEW

[chest ap]
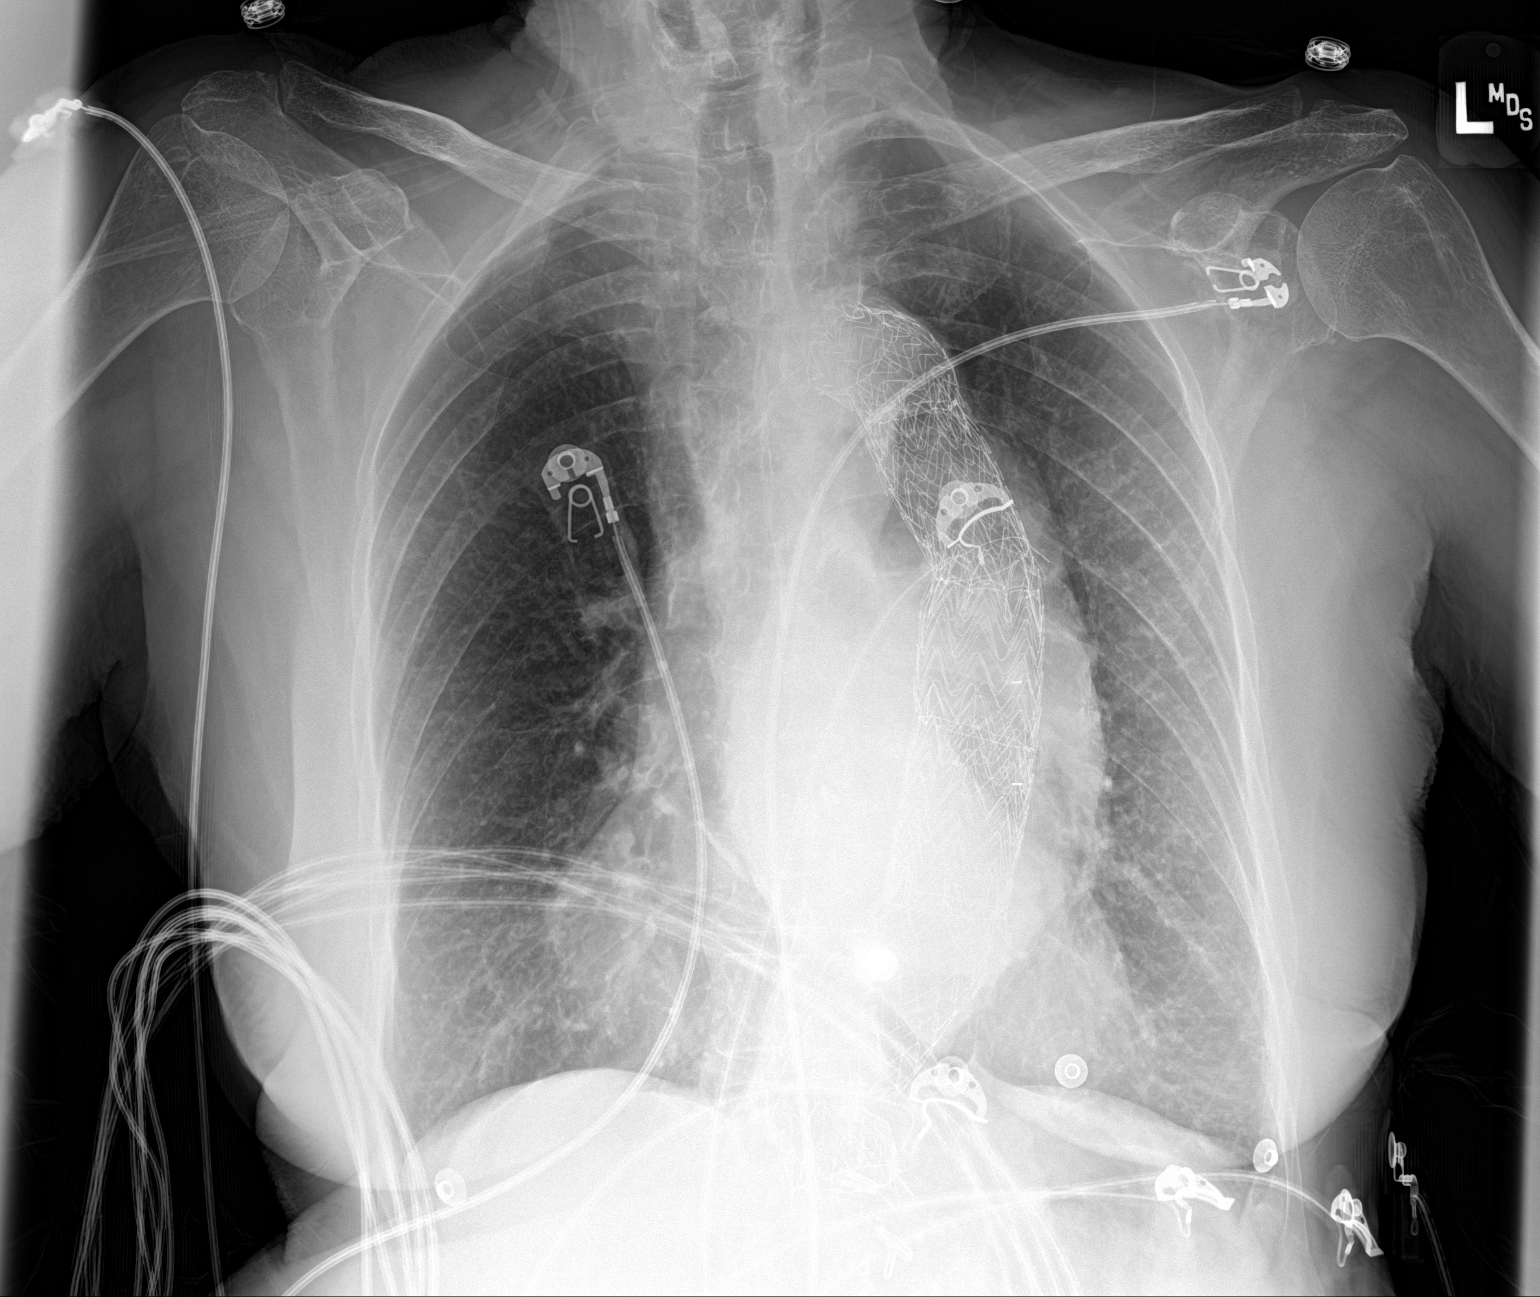

[1 of 1 positions shown; findings below may reference images not displayed]

FINDINGS: There is no edema or consolidation. Heart is mildly enlarged with
pulmonary vascularity within normal limits. There is a stent graft
traversing a descending thoracic aortic aneurysm, a finding present
previously. No adenopathy. No focal bone lesions.
IMPRESSION: Stent graft traversing a descending thoracic aortic aneurysm, a
finding seen previously. No edema or consolidation. Stable cardiac
prominence.

## 2019-05-02 IMAGING — CT CT ANGIO CHEST
2 of 8 series · 17 of 46 positions shown · IV contrast (agent unspecified)
Comparison: Prior CT scan of the chest 06/27/2016

CLINICAL DATA: 82-year-old female with new onset dyspnea on
exertion and orthopnea. Known history of thoracic aortic aneurysm
status post TEVAR reportedly in 6779.

EXAM:
CT ANGIOGRAPHY CHEST WITH CONTRAST
TECHNIQUE: Multidetector CT imaging of the chest was performed using the
standard protocol during bolus administration of intravenous
contrast. Multiplanar CT image reconstructions and MIPs were
obtained to evaluate the vascular anatomy.
CONTRAST:  80 mL Hsovue-IAA

[Series 6: dissection 3.0 i30f 3 · axial · 0.63mm/px · z∈[+965,+1241]mm · 14 of 104 slices shown]
[im 6/104  lung]
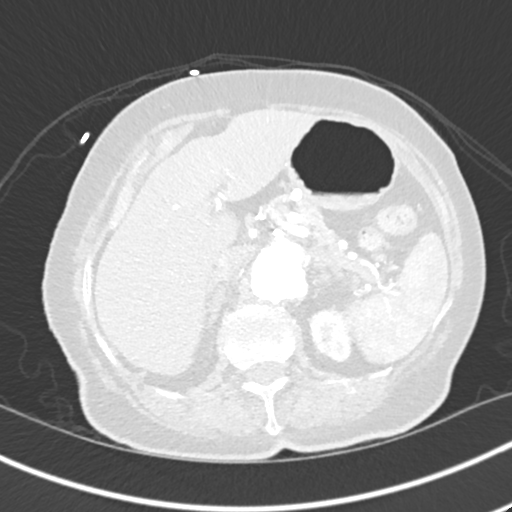
[im 12/104  soft-tissue]
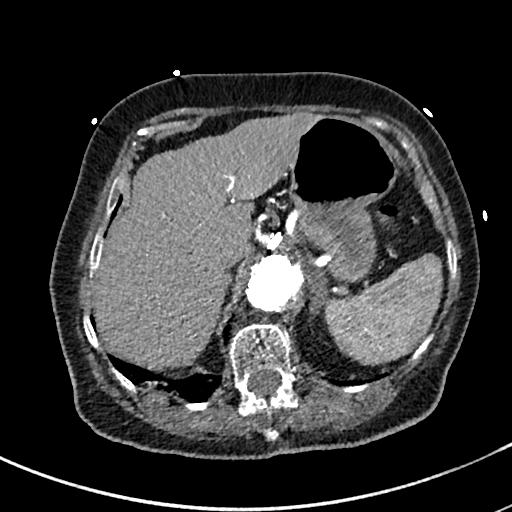
[im 23/104  lung]
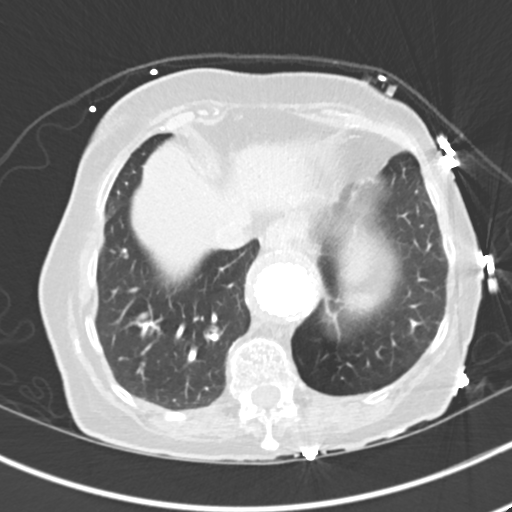
[im 29/104  soft-tissue]
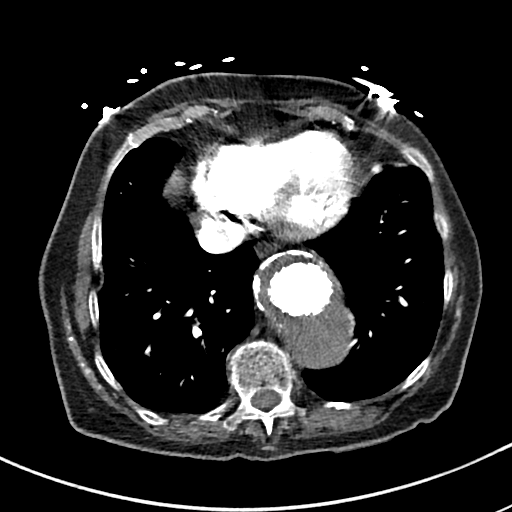
[im 35/104  lung]
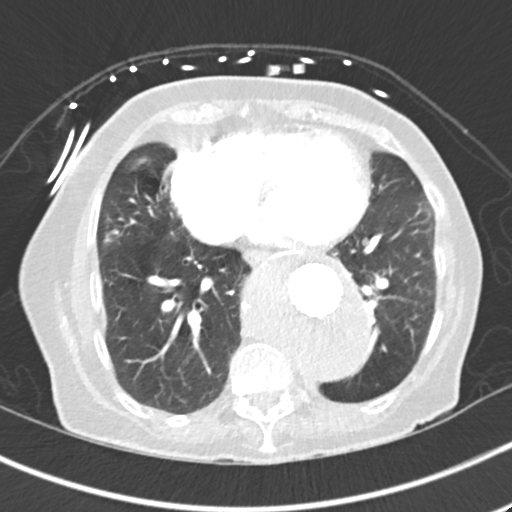
[im 41/104  soft-tissue]
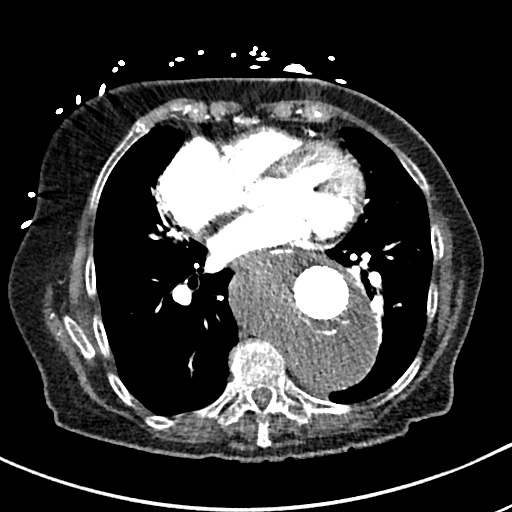
[im 46/104  lung]
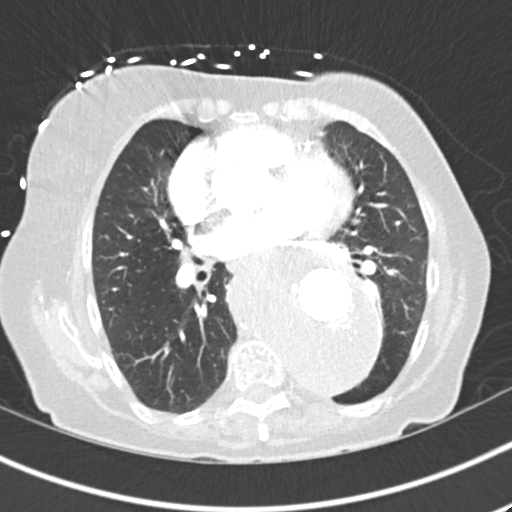
[im 58/104  soft-tissue]
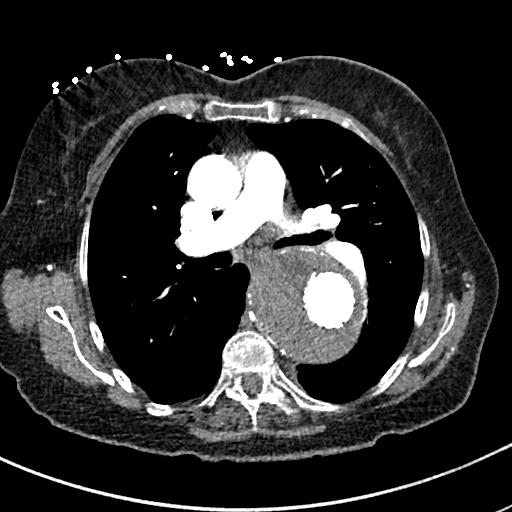
[im 63/104  lung]
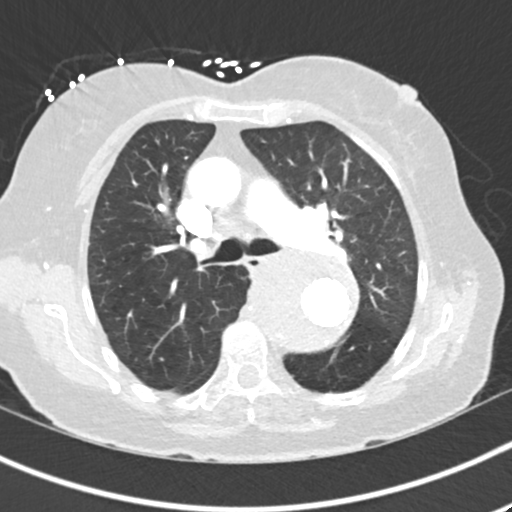
[im 69/104  soft-tissue]
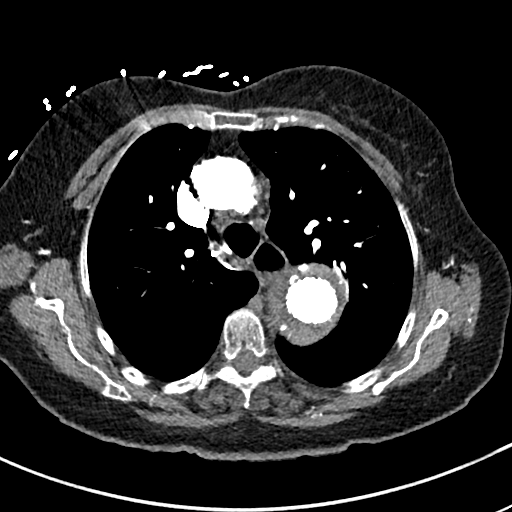
[im 75/104  lung]
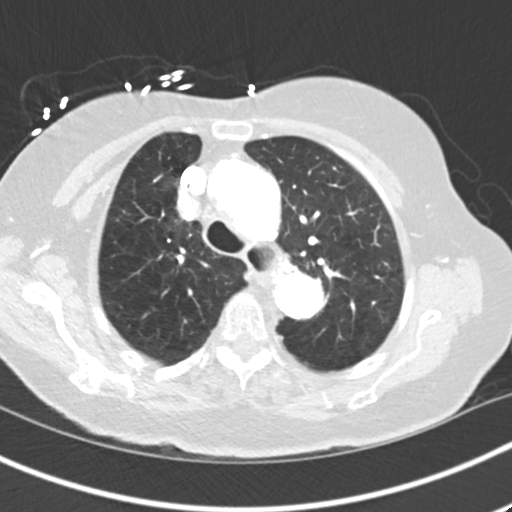
[im 81/104  soft-tissue]
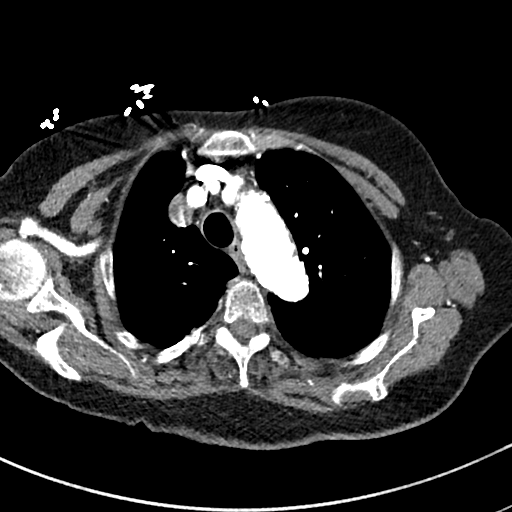
[im 92/104  lung]
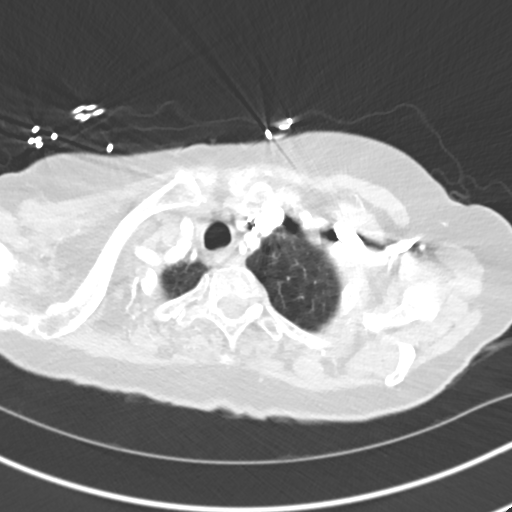
[im 98/104  soft-tissue]
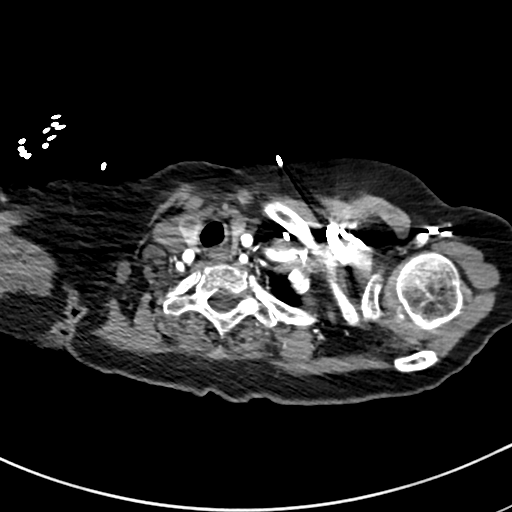

[Series 9: coronals · coronal · 0.58mm/px · 3 of 108 slices shown]
[im 27/108  soft-tissue]
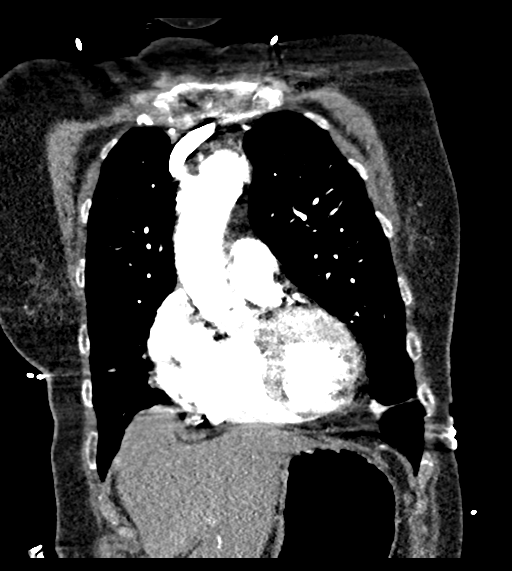
[im 54/108  soft-tissue]
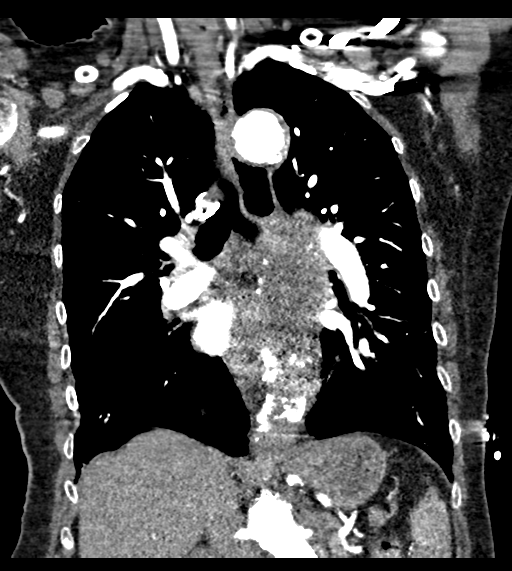
[im 81/108  soft-tissue]
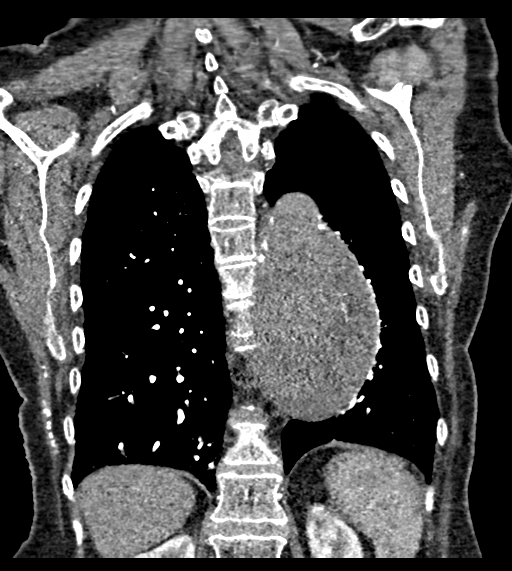

[17 of 46 positions shown; findings below may reference images not displayed]

FINDINGS: Cardiovascular: Aneurysmal dilatation of the aortic arch measures
3.9 cm which is insignificantly changed compared to 3.8 cm
previously. Surgical changes of prior thoracic aortic aneurysm
repair using Phan did core endo prostheses extending from the distal
aortic infundibulum throughout the visualized descending thoracic
aorta and into the super renal abdominal aorta. No evidence
unchanging configuration of the endograft system. The excluded
aneurysm sac measures 10.3 x 6.8 cm in diameter, unchanged compared
to prior. The super renal abdominal aorta is irregular in aneurysmal
with a maximal diameter of 3.8 cm which is unchanged compared to
prior. Of note, the distal most landing zone is not completely
opposed to this irregular aneurysmal segment, however there is
sufficient seal proximal to this that a type 1B endoleak is highly
unlikely. The origin of the mesenteric vessels are ligated.
Incompletely imaged evidence of surgical debranching with probable
distal anastomosis. All visualized vessels are patent. Both renal
arteries are patent.

Calcifications are present along the aortic valve leaflets. There is
left ventricular dilatation with marked thinning of the lateral wall
consistent with prior lateral wall infarction. No pericardial
effusion.

The main and central pulmonary arteries are well visualized and well
opacified. No evidence of pulmonary arterial enlargement or
pulmonary embolus.

Mediastinum/Nodes: Approximately 1 cm hypoechoic nodule within the
right thyroid gland demonstrates no significant interval change
compared to prior. New mediastinal adenopathy. The visualized
thoracic esophagus is unremarkable.

Lungs/Pleura: Moderate centrilobular pulmonary emphysema. No
suspicious pulmonary nodule or mass. No focal airspace
consolidation, pleural effusion, pneumothorax or evidence of
pulmonary edema.

Upper Abdomen: 1.8 cm intermediate attenuation lesion arising
exophytic from the upper pole of the left kidney demonstrates
borderline enhancement between the non contrasted series and the
arterial phase series.

Musculoskeletal: No acute fracture or aggressive appearing lytic or
blastic osseous lesion. Stable T10 compression fracture.

Review of the MIP images confirms the above findings.
IMPRESSION: 1. No acute abnormality within the chest to explain the patient's
clinical symptoms. Specifically, no evidence of pulmonary embolus,
acute aortic abnormality, pulmonary edema or pneumonia.
2. Moderately severe centrilobular pulmonary emphysema.
3. **An incidental finding of potential clinical significance has
been found. Borderline enhancement of an intermediate attenuation
lesion arising exophytic from the upper pole of the left kidney.
This could represent a low grade renal neoplasm such as papillary
carcinoma. Recommend further evaluation with contrasted MRI of the
abdomen.**
4. Stable changes of thoracic and upper abdominal aneurysm status
post TEVAR without significant interval change compared to
06/27/2016.
5. Left ventricular dilatation with marked thinning of the lateral
and inferolateral walls consistent with prior myocardial infarction.
The thinned segment also bows outward slightly suggesting early post
MI aneurysmal dilatation of the left ventricle.
6. Aortic valve calcifications.
7. Stable T10 compression fracture.
